# Patient Record
Sex: Female | Born: 1985 | Race: White | Hispanic: No | Marital: Single | Smoking: Never smoker
Health system: Southern US, Community
[De-identification: ages and names within clinical notes are randomized; demographics above are authoritative.]

## PROBLEM LIST (undated history)

## (undated) DIAGNOSIS — T7840XA Allergy, unspecified, initial encounter: Secondary | ICD-10-CM

## (undated) DIAGNOSIS — I10 Essential (primary) hypertension: Secondary | ICD-10-CM

## (undated) DIAGNOSIS — F419 Anxiety disorder, unspecified: Secondary | ICD-10-CM

## (undated) DIAGNOSIS — R7303 Prediabetes: Secondary | ICD-10-CM

## (undated) DIAGNOSIS — Z789 Other specified health status: Secondary | ICD-10-CM

## (undated) DIAGNOSIS — F32A Depression, unspecified: Secondary | ICD-10-CM

## (undated) HISTORY — DX: Allergy, unspecified, initial encounter: T78.40XA

## (undated) HISTORY — DX: Anxiety disorder, unspecified: F41.9

## (undated) HISTORY — DX: Depression, unspecified: F32.A

---
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
## 2006-03-23 ENCOUNTER — Emergency Department (HOSPITAL_COMMUNITY): Admission: EM | Admit: 2006-03-23 | Discharge: 2006-03-23 | Payer: Self-pay | Admitting: Family Medicine

## 2006-04-05 ENCOUNTER — Emergency Department (HOSPITAL_COMMUNITY): Admission: EM | Admit: 2006-04-05 | Discharge: 2006-04-05 | Payer: Self-pay | Admitting: Family Medicine

## 2006-05-29 ENCOUNTER — Emergency Department (HOSPITAL_COMMUNITY): Admission: EM | Admit: 2006-05-29 | Discharge: 2006-05-30 | Payer: Self-pay | Admitting: Emergency Medicine

## 2006-07-25 ENCOUNTER — Emergency Department (HOSPITAL_COMMUNITY): Admission: EM | Admit: 2006-07-25 | Discharge: 2006-07-25 | Payer: Self-pay | Admitting: Emergency Medicine

## 2007-01-26 ENCOUNTER — Emergency Department (HOSPITAL_COMMUNITY): Admission: EM | Admit: 2007-01-26 | Discharge: 2007-01-26 | Payer: Self-pay | Admitting: Family Medicine

## 2007-10-27 ENCOUNTER — Emergency Department (HOSPITAL_COMMUNITY): Admission: EM | Admit: 2007-10-27 | Discharge: 2007-10-27 | Payer: Self-pay | Admitting: Emergency Medicine

## 2008-02-27 ENCOUNTER — Emergency Department (HOSPITAL_COMMUNITY): Admission: EM | Admit: 2008-02-27 | Discharge: 2008-02-27 | Payer: Self-pay | Admitting: Family Medicine

## 2010-07-18 ENCOUNTER — Emergency Department (HOSPITAL_COMMUNITY): Payer: No Typology Code available for payment source

## 2010-07-18 ENCOUNTER — Emergency Department (HOSPITAL_COMMUNITY)
Admission: EM | Admit: 2010-07-18 | Discharge: 2010-07-19 | Disposition: A | Discharge status: Home / Self Care | Payer: No Typology Code available for payment source | Attending: Emergency Medicine | Admitting: Emergency Medicine

## 2010-07-18 DIAGNOSIS — R071 Chest pain on breathing: Secondary | ICD-10-CM | POA: Insufficient documentation

## 2010-07-18 DIAGNOSIS — R109 Unspecified abdominal pain: Secondary | ICD-10-CM | POA: Insufficient documentation

## 2010-07-18 DIAGNOSIS — Y92410 Unspecified street and highway as the place of occurrence of the external cause: Secondary | ICD-10-CM | POA: Insufficient documentation

## 2010-07-18 DIAGNOSIS — R0789 Other chest pain: Secondary | ICD-10-CM | POA: Insufficient documentation

## 2010-07-18 DIAGNOSIS — T1490XA Injury, unspecified, initial encounter: Secondary | ICD-10-CM | POA: Insufficient documentation

## 2010-12-03 LAB — POCT PREGNANCY, URINE: Preg Test, Ur: NEGATIVE

## 2012-10-31 ENCOUNTER — Emergency Department (HOSPITAL_COMMUNITY)
Admission: EM | Admit: 2012-10-31 | Discharge: 2012-10-31 | Disposition: A | Discharge status: Home / Self Care | Payer: Self-pay | Attending: Emergency Medicine | Admitting: Emergency Medicine

## 2012-10-31 ENCOUNTER — Encounter (HOSPITAL_COMMUNITY): Payer: Self-pay

## 2012-10-31 DIAGNOSIS — R21 Rash and other nonspecific skin eruption: Secondary | ICD-10-CM | POA: Insufficient documentation

## 2012-10-31 DIAGNOSIS — B86 Scabies: Secondary | ICD-10-CM | POA: Insufficient documentation

## 2012-10-31 MED ORDER — PERMETHRIN 5 % EX CREA: TOPICAL_CREAM | CUTANEOUS | Status: DC

## 2012-10-31 NOTE — ED Provider Notes (Signed)
CSN: [id number]     Arrival date & time 10/31/12  1659 History   First MD Initiated Contact with Patient 10/31/12 1730     Chief Complaint  Patient presents with  . Pruritis   (Consider location/radiation/quality/duration/timing/severity/associated sxs/prior Treatment) HPI Comments: Mandy Stewart is a 27 y.o. Female presenting with a 1 week history of pruritis and small raised scattered skin lesions suggestive of insect bites.  She recently found out that a cousin who visited from [place] of state several weeks ago had scabies while visiting.  She is desirous of treatment.  She has used benadryl which temporarily improves the itching.  She has one roommate who does not currently have symptoms.     The history is provided by the patient.    History reviewed. No pertinent past medical history. History reviewed. No pertinent past surgical history. No family history on file. History  Substance Use Topics  . Smoking status: Never Smoker   . Smokeless tobacco: Not on file  . Alcohol Use: No   OB History   Grav Para Term Preterm Abortions TAB SAB Ect Mult Living                 Review of Systems  Constitutional: Negative for fever and chills.  HENT: Negative for facial swelling and neck pain.   Eyes: Negative.   Respiratory: Negative for chest tightness, shortness of breath and wheezing.   Cardiovascular: Negative for chest pain.  Gastrointestinal: Negative for nausea and abdominal pain.  Genitourinary: Negative.   Musculoskeletal: Negative for joint swelling and arthralgias.  Skin: Positive for rash. Negative for wound.  Neurological: Negative for dizziness, weakness, light-headedness, numbness and headaches.  Psychiatric/Behavioral: Negative.     Allergies  Codeine  Home Medications   Current Outpatient Rx  Name  Route  Sig  Dispense  Refill  . montelukast (SINGULAIR) 10 MG tablet   Oral   Take 10 mg by mouth at bedtime.         . permethrin (ELIMITE) 5 % cream     Apply to affected area once   60 g   0    BP 128/82  Pulse 99  Temp(Src) 98.4 F (36.9 C) (Oral)  Resp 20  Ht 5\' 1"  (1.549 m)  Wt 152 lb (68.947 kg)  BMI 28.74 kg/m2  SpO2 100%  LMP 10/04/2012 Physical Exam  Nursing note and vitals reviewed. Constitutional: She appears well-developed and well-nourished.  HENT:  Head: Normocephalic and atraumatic.  Eyes: Conjunctivae are normal.  Neck: Normal range of motion.  Cardiovascular: Normal rate, regular rhythm, normal heart sounds and intact distal pulses.   Pulmonary/Chest: Effort normal and breath sounds normal. She has no wheezes.  Abdominal: Soft. Bowel sounds are normal. There is no tenderness.  Musculoskeletal: Normal range of motion.  Neurological: She is alert.  Skin: Skin is warm and dry. Rash noted.  Scattered small papules on arms, abdomen,  Antecubital spaces,  fingerweb spaces.  No drainage, no surrounding erythema, induration or fluctuance.    Psychiatric: She has a normal mood and affect.    ED Course  Procedures (including critical care time) Labs Review Labs Reviewed - No data to display Imaging Review No results found.  MDM   1. Scabies    Permethrin, benadryl.  Advised washing linens, etc,  Repeat tx in 10 days if sx persist.    Burgess Amor, PA-C 10/31/12 1843

## 2012-10-31 NOTE — ED Notes (Signed)
Pt reports "itching all over", was exposed to scabies recently.

## 2012-10-31 NOTE — ED Provider Notes (Signed)
Medical screening examination/treatment/procedure(s) were performed by non-physician practitioner and as supervising physician I was immediately available for consultation/collaboration.  Kaisen Ackers, MD 10/31/12 2347 

## 2013-02-11 ENCOUNTER — Emergency Department (HOSPITAL_COMMUNITY)
Admission: EM | Admit: 2013-02-11 | Discharge: 2013-02-12 | Disposition: A | Discharge status: Home / Self Care | Payer: Self-pay | Attending: Emergency Medicine | Admitting: Emergency Medicine

## 2013-02-11 ENCOUNTER — Encounter (HOSPITAL_COMMUNITY): Payer: Self-pay | Admitting: Emergency Medicine

## 2013-02-11 DIAGNOSIS — N760 Acute vaginitis: Secondary | ICD-10-CM | POA: Insufficient documentation

## 2013-02-11 DIAGNOSIS — Z3202 Encounter for pregnancy test, result negative: Secondary | ICD-10-CM | POA: Insufficient documentation

## 2013-02-11 DIAGNOSIS — A499 Bacterial infection, unspecified: Secondary | ICD-10-CM | POA: Insufficient documentation

## 2013-02-11 DIAGNOSIS — R112 Nausea with vomiting, unspecified: Secondary | ICD-10-CM | POA: Insufficient documentation

## 2013-02-11 DIAGNOSIS — R300 Dysuria: Secondary | ICD-10-CM

## 2013-02-11 DIAGNOSIS — Z79899 Other long term (current) drug therapy: Secondary | ICD-10-CM | POA: Insufficient documentation

## 2013-02-11 DIAGNOSIS — B9689 Other specified bacterial agents as the cause of diseases classified elsewhere: Secondary | ICD-10-CM | POA: Insufficient documentation

## 2013-02-11 LAB — URINALYSIS, ROUTINE W REFLEX MICROSCOPIC
Bilirubin Urine: NEGATIVE
Glucose, UA: 250 mg/dL — AB
Hgb urine dipstick: NEGATIVE
pH: 7 (ref 5.0–8.0)

## 2013-02-11 LAB — URINE MICROSCOPIC-ADD ON

## 2013-02-11 LAB — WET PREP, GENITAL: WBC, Wet Prep HPF POC: NONE SEEN

## 2013-02-11 MED ORDER — METRONIDAZOLE 500 MG PO TABS: 500.0000 mg | ORAL_TABLET | Freq: Two times a day (BID) | ORAL | Status: DC

## 2013-02-11 MED ORDER — TRAMADOL HCL 50 MG PO TABS: 50.0000 mg | ORAL_TABLET | Freq: Four times a day (QID) | ORAL | Status: DC | PRN

## 2013-02-11 NOTE — ED Provider Notes (Signed)
CSN: [id number]     Arrival date & time 02/11/13  1146 History   First MD Initiated Contact with Patient 02/11/13 1549     Chief Complaint  Patient presents with  . Dysuria  . Emesis   (Consider location/radiation/quality/duration/timing/severity/associated sxs/prior Treatment) Patient is a 27 y.o. female presenting with dysuria and vomiting. The history is provided by the patient.  Dysuria Associated symptoms: nausea and vomiting   Associated symptoms: no abdominal pain and no flank pain   Emesis Associated symptoms: no abdominal pain, no diarrhea and no headaches    patient has had some pain with urination for the last 2 days. She states her abdomen hurts when fevers. No cough. No diarrhea. No flank pain. It hurts her abdomen when she urinates, it is not painful to urinate. She has also had vomiting. No flank pain.  She states that she's had a recent miscarriage. She states that she is 10 weeks. She's not had another period since.  History reviewed. No pertinent past medical history. History reviewed. No pertinent past surgical history. History reviewed. No pertinent family history. History  Substance Use Topics  . Smoking status: Never Smoker   . Smokeless tobacco: Not on file  . Alcohol Use: No   OB History   Grav Para Term Preterm Abortions TAB SAB Ect Mult Living                 Review of Systems  Constitutional: Negative for activity change and appetite change.  Eyes: Negative for pain.  Respiratory: Negative for chest tightness and shortness of breath.   Cardiovascular: Negative for chest pain and leg swelling.  Gastrointestinal: Positive for nausea and vomiting. Negative for abdominal pain and diarrhea.  Genitourinary: Positive for dysuria. Negative for flank pain.  Musculoskeletal: Negative for back pain and neck stiffness.  Skin: Negative for rash.  Neurological: Negative for weakness, numbness and headaches.  Psychiatric/Behavioral: Negative for behavioral  problems.    Allergies  Codeine  Home Medications   Current Outpatient Rx  Name  Route  Sig  Dispense  Refill  . ibuprofen (ADVIL,MOTRIN) 200 MG tablet   Oral   Take 200 mg by mouth every 6 (six) hours as needed.         . montelukast (SINGULAIR) 10 MG tablet   Oral   Take 10 mg by mouth at bedtime as needed (for allergy relief).          . Phenazopyridine HCl (AZO TABS PO)   Oral   Take 2 tablets by mouth daily as needed (for urunary tract relief).         . Prenatal Vit-Fe Fumarate-FA (PRENATAL MULTIVITAMIN) TABS tablet   Oral   Take 1 tablet by mouth daily at 12 noon.         Marland Kitchen tetrahydrozoline (EYE DROPS) 0.05 % ophthalmic solution   Both Eyes   Place 1 drop into both eyes daily as needed.         . metroNIDAZOLE (FLAGYL) 500 MG tablet   Oral   Take 1 tablet (500 mg total) by mouth 2 (two) times daily.   14 tablet   0   . traMADol (ULTRAM) 50 MG tablet   Oral   Take 1 tablet (50 mg total) by mouth every 6 (six) hours as needed.   10 tablet   0    BP 122/91  Pulse 74  Temp(Src) 98.5 F (36.9 C) (Oral)  Resp 20  Ht 5' (1.524 m)  Wt  148 lb (67.132 kg)  BMI 28.90 kg/m2  SpO2 96% Physical Exam  Nursing note and vitals reviewed. Constitutional: She is oriented to person, place, and time. She appears well-developed and well-nourished.  HENT:  Head: Normocephalic.  Cardiovascular: Normal rate, regular rhythm and normal heart sounds.   No murmur heard. Pulmonary/Chest: Effort normal and breath sounds normal. No respiratory distress. She has no wheezes. She has no rales.  Abdominal: Soft. Bowel sounds are normal. She exhibits no distension. There is no tenderness. There is no rebound and no guarding.  Musculoskeletal: Normal range of motion.  Neurological: She is alert and oriented to person, place, and time. No cranial nerve deficit.  Skin: Skin is warm and dry.  Psychiatric: She has a normal mood and affect. Her speech is normal.   pelvic exam  showed some white foamy discharge. Mild midline tenderness. No adnexal tenderness. Uterus likely retroverted and retroflexed.  ED Course  Procedures (including critical care time) Labs Review Labs Reviewed  WET PREP, GENITAL - Abnormal; Notable for the following:    Clue Cells Wet Prep HPF POC MODERATE (*)    All other components within normal limits  URINALYSIS, ROUTINE W REFLEX MICROSCOPIC - Abnormal; Notable for the following:    Color, Urine ORANGE (*)    Glucose, UA 250 (*)    Ketones, ur TRACE (*)    Protein, ur 100 (*)    Urobilinogen, UA >8.0 (*)    Nitrite POSITIVE (*)    Leukocytes, UA TRACE (*)    All other components within normal limits  URINE MICROSCOPIC-ADD ON - Abnormal; Notable for the following:    Squamous Epithelial / LPF MANY (*)    Bacteria, UA FEW (*)    All other components within normal limits  URINE CULTURE  GC/CHLAMYDIA PROBE AMP  PREGNANCY, URINE   Imaging Review No results found.  EKG Interpretation   None       MDM   1. Bacterial vaginosis   2. Dysuria    Patient with some dysuria and pelvic pain. Reported recent miscarriage. Negative urine pregnancy test here. Does have bacterial vaginosis on wet prep. Only few bacteria in urine but does have nitrite and leukocytes leuk esterase. Will give Flagyl and urine culture has been sent. Will followup with gynecology as needed. Minimal abdominal tenderness    Juliet Rude. Rubin Payor, MD 02/11/13 [phone]

## 2013-02-11 NOTE — ED Notes (Signed)
Pt c/o pain with urination since Friday, took AZO with relief until last night, noted blood in urine now, denies fever, + emesis,right lower abd pain, pt also recent miscarriage about 3 weeks ago

## 2013-02-12 LAB — URINE CULTURE
Colony Count: NO GROWTH
Culture: NO GROWTH

## 2013-05-16 ENCOUNTER — Encounter (HOSPITAL_COMMUNITY): Payer: Self-pay | Admitting: Emergency Medicine

## 2013-05-16 ENCOUNTER — Emergency Department (HOSPITAL_COMMUNITY)
Admission: EM | Admit: 2013-05-16 | Discharge: 2013-05-17 | Disposition: A | Discharge status: Home / Self Care | Payer: Self-pay | Attending: Emergency Medicine | Admitting: Emergency Medicine

## 2013-05-16 DIAGNOSIS — Z79899 Other long term (current) drug therapy: Secondary | ICD-10-CM | POA: Insufficient documentation

## 2013-05-16 DIAGNOSIS — L03119 Cellulitis of unspecified part of limb: Principal | ICD-10-CM

## 2013-05-16 DIAGNOSIS — L02419 Cutaneous abscess of limb, unspecified: Secondary | ICD-10-CM | POA: Insufficient documentation

## 2013-05-16 DIAGNOSIS — Z792 Long term (current) use of antibiotics: Secondary | ICD-10-CM | POA: Insufficient documentation

## 2013-05-16 DIAGNOSIS — L03116 Cellulitis of left lower limb: Secondary | ICD-10-CM

## 2013-05-16 NOTE — ED Notes (Signed)
Pt reports "boil" to left inner thigh, pt states she has been having headache, chills and fever. Pt states she just notified the boil yesterday

## 2013-05-17 MED ORDER — SULFAMETHOXAZOLE-TMP DS 800-160 MG PO TABS
1.0000 | ORAL_TABLET | Freq: Once | ORAL | Status: AC
  Administered 2013-05-17: 1 via ORAL
  Filled 2013-05-17: qty 1

## 2013-05-17 MED ORDER — CEPHALEXIN 500 MG PO CAPS: 500.0000 mg | ORAL_CAPSULE | Freq: Four times a day (QID) | ORAL | Status: DC

## 2013-05-17 MED ORDER — ACETAMINOPHEN 325 MG PO TABS
650.0000 mg | ORAL_TABLET | Freq: Once | ORAL | Status: AC
  Administered 2013-05-17: 650 mg via ORAL
  Filled 2013-05-17: qty 2

## 2013-05-17 MED ORDER — SULFAMETHOXAZOLE-TRIMETHOPRIM 800-160 MG PO TABS: 1.0000 | ORAL_TABLET | Freq: Two times a day (BID) | ORAL | Status: DC

## 2013-05-17 MED ORDER — CEPHALEXIN 500 MG PO CAPS
500.0000 mg | ORAL_CAPSULE | Freq: Once | ORAL | Status: AC
  Administered 2013-05-17: 500 mg via ORAL
  Filled 2013-05-17: qty 1

## 2013-05-17 MED ORDER — IBUPROFEN 800 MG PO TABS: 800.0000 mg | ORAL_TABLET | Freq: Three times a day (TID) | ORAL | Status: DC

## 2013-05-17 NOTE — Discharge Instructions (Signed)
Cellulitis °Cellulitis is an infection of the skin and the tissue under the skin. The infected area is usually red and tender. This happens most often in the arms and lower legs. °HOME CARE  °· Take your antibiotic medicine as told. Finish the medicine even if you start to feel better. °· Keep the infected arm or leg raised (elevated). °· Put a warm cloth on the area up to 4 times per day. °· Only take medicines as told by your doctor. °· Keep all doctor visits as told. °GET HELP RIGHT AWAY IF:  °· You have a fever. °· You feel very sleepy. °· You throw up (vomit) or have watery poop (diarrhea). °· You feel sick and have muscle aches and pains. °· You see red streaks on the skin coming from the infected area. °· Your red area gets bigger or turns a dark color. °· Your bone or joint under the infected area is painful after the skin heals. °· Your infection comes back in the same area or different area. °· You have a puffy (swollen) bump in the infected area. °· You have new symptoms. °MAKE SURE YOU:  °· Understand these instructions. °· Will watch your condition. °· Will get help right away if you are not doing well or get worse. °Document Released: 08/03/2007 Document Revised: 08/16/2011 Document Reviewed: 05/02/2011 °[employer]® Patient Information ©2014 [employer]. ° °

## 2013-05-17 NOTE — ED Provider Notes (Signed)
CSN: [id number]     Arrival date & time 05/16/13  2313 History   First MD Initiated Contact with Patient 05/17/13 0011     Chief Complaint  Patient presents with  . Abscess     (Consider location/radiation/quality/duration/timing/severity/associated sxs/prior Treatment) HPI Comments: JANEYA DEYO is a 28 y.o. female who presents to the Emergency Department complaining of pain, redness and swelling of the inner left thigh.  patient states that she noticed symptoms yesterday. she also c/o fever, body aches, intermittent headaches and chills that also began yesterday.  Patient is concerned that it may be a "boil".  She denies abdominal pain, vomiting, or hx of MRSA  The history is provided by the patient.    History reviewed. No pertinent past medical history. History reviewed. No pertinent past surgical history. No family history on file. History  Substance Use Topics  . Smoking status: Never Smoker   . Smokeless tobacco: Not on file  . Alcohol Use: No   OB History   Grav Para Term Preterm Abortions TAB SAB Ect Mult Living                 Review of Systems  Constitutional: Negative for fever and chills.  Gastrointestinal: Negative for nausea and vomiting.  Musculoskeletal: Negative for arthralgias and joint swelling.  Skin: Positive for color change.       Pustule to left upper thigh with redness   Hematological: Negative for adenopathy.  All other systems reviewed and are negative.      Allergies  Codeine  Home Medications   Current Outpatient Rx  Name  Route  Sig  Dispense  Refill  . ibuprofen (ADVIL,MOTRIN) 200 MG tablet   Oral   Take 200 mg by mouth every 6 (six) hours as needed.         . metroNIDAZOLE (FLAGYL) 500 MG tablet   Oral   Take 1 tablet (500 mg total) by mouth 2 (two) times daily.   14 tablet   0   . montelukast (SINGULAIR) 10 MG tablet   Oral   Take 10 mg by mouth at bedtime as needed (for allergy relief).          . Phenazopyridine HCl  (AZO TABS PO)   Oral   Take 2 tablets by mouth daily as needed (for urunary tract relief).         . Prenatal Vit-Fe Fumarate-FA (PRENATAL MULTIVITAMIN) TABS tablet   Oral   Take 1 tablet by mouth daily at 12 noon.         Marland Kitchen tetrahydrozoline (EYE DROPS) 0.05 % ophthalmic solution   Both Eyes   Place 1 drop into both eyes daily as needed.         . traMADol (ULTRAM) 50 MG tablet   Oral   Take 1 tablet (50 mg total) by mouth every 6 (six) hours as needed.   10 tablet   0    BP 128/72  Pulse 72  Temp(Src) 99.6 F (37.6 C) (Oral)  Resp 16  Ht 5' (1.524 m)  Wt 160 lb (72.576 kg)  BMI 31.25 kg/m2  SpO2 96%  LMP 05/16/2013 Physical Exam  Nursing note and vitals reviewed. Constitutional: She is oriented to person, place, and time. She appears well-developed and well-nourished. No distress.  HENT:  Head: Normocephalic and atraumatic.  Cardiovascular: Normal rate, regular rhythm, normal heart sounds and intact distal pulses.   No murmur heard. Pulmonary/Chest: Effort normal and breath sounds normal. No  respiratory distress.  Abdominal: Soft. She exhibits no distension. There is no tenderness. There is no rebound and no guarding.  Musculoskeletal: Normal range of motion.  Lymphadenopathy:       Left: Inguinal adenopathy present.  Neurological: She is alert and oriented to person, place, and time. She exhibits normal muscle tone. Coordination normal.  Skin: Skin is warm and dry. There is erythema.     Single, 1 cm open pustule to the medial left thigh with surrounding erythema.  Slight drainage present.  No induration or fluctuance.  No lymphangitis.      ED Course  Procedures (including critical care time) Labs Review Labs Reviewed - No data to display Imaging Review No results found.   EKG Interpretation None      MDM   Final diagnoses:  Cellulitis of left thigh    Pt is well appearing, VSS.  Ambulates w/o difficulty.  Has small open pustule with  surrounding erythema .  Likely cellulitis vs early abscess.  I&D not indicated at this time.  Pt agrees to warm soaks, bactrim and keflex.  Advised to return here in 1-2 days for recheck.  Pt verbalized understanding and agrees to plan.  Leading edge of erythema was marked.    The patient appears reasonably screened and/or stabilized for discharge and I doubt any other medical condition or other [facility] requiring further screening, evaluation, or treatment in the ED at this time prior to discharge.    Hercules Hasler L. Trisha Mangleriplett, PA-C 05/17/13 1126

## 2013-05-18 NOTE — ED Provider Notes (Signed)
Medical screening examination/treatment/procedure(s) were performed by non-physician practitioner and as supervising physician I was immediately available for consultation/collaboration.   Arul Farabee, MD 05/18/13 0721 

## 2013-12-24 ENCOUNTER — Emergency Department (HOSPITAL_COMMUNITY)
Admission: EM | Admit: 2013-12-24 | Discharge: 2013-12-24 | Disposition: A | Discharge status: Home / Self Care | Payer: Self-pay | Attending: Emergency Medicine | Admitting: Emergency Medicine

## 2013-12-24 ENCOUNTER — Encounter (HOSPITAL_COMMUNITY): Payer: Self-pay | Admitting: Emergency Medicine

## 2013-12-24 DIAGNOSIS — Z792 Long term (current) use of antibiotics: Secondary | ICD-10-CM | POA: Insufficient documentation

## 2013-12-24 DIAGNOSIS — K529 Noninfective gastroenteritis and colitis, unspecified: Secondary | ICD-10-CM | POA: Insufficient documentation

## 2013-12-24 DIAGNOSIS — Z79899 Other long term (current) drug therapy: Secondary | ICD-10-CM | POA: Insufficient documentation

## 2013-12-24 DIAGNOSIS — R112 Nausea with vomiting, unspecified: Secondary | ICD-10-CM | POA: Insufficient documentation

## 2013-12-24 LAB — COMPREHENSIVE METABOLIC PANEL
ALT: 11 U/L (ref 0–35)
ANION GAP: 13 (ref 5–15)
AST: 16 U/L (ref 0–37)
Albumin: 4.1 g/dL (ref 3.5–5.2)
Alkaline Phosphatase: 64 U/L (ref 39–117)
BUN: 18 mg/dL (ref 6–23)
CO2: 26 meq/L (ref 19–32)
CREATININE: 0.68 mg/dL (ref 0.50–1.10)
Calcium: 9.4 mg/dL (ref 8.4–10.5)
Chloride: 102 mEq/L (ref 96–112)
Glucose, Bld: 126 mg/dL — ABNORMAL HIGH (ref 70–99)
Potassium: 4 mEq/L (ref 3.7–5.3)
Sodium: 141 mEq/L (ref 137–147)
Total Bilirubin: 0.3 mg/dL (ref 0.3–1.2)
Total Protein: 8 g/dL (ref 6.0–8.3)

## 2013-12-24 LAB — CBC WITH DIFFERENTIAL/PLATELET
Basophils Absolute: 0 10*3/uL (ref 0.0–0.1)
Basophils Relative: 0 % (ref 0–1)
EOS ABS: 0 10*3/uL (ref 0.0–0.7)
Eosinophils Relative: 0 % (ref 0–5)
HCT: 39.2 % (ref 36.0–46.0)
HEMOGLOBIN: 13.3 g/dL (ref 12.0–15.0)
LYMPHS ABS: 1.5 10*3/uL (ref 0.7–4.0)
LYMPHS PCT: 13 % (ref 12–46)
MCH: 29.8 pg (ref 26.0–34.0)
MCHC: 33.9 g/dL (ref 30.0–36.0)
MCV: 87.7 fL (ref 78.0–100.0)
MONOS PCT: 4 % (ref 3–12)
Monocytes Absolute: 0.5 10*3/uL (ref 0.1–1.0)
NEUTROS PCT: 83 % — AB (ref 43–77)
Neutro Abs: 9 10*3/uL — ABNORMAL HIGH (ref 1.7–7.7)
PLATELETS: 230 10*3/uL (ref 150–400)
RBC: 4.47 MIL/uL (ref 3.87–5.11)
RDW: 12.6 % (ref 11.5–15.5)
WBC: 11 10*3/uL — AB (ref 4.0–10.5)

## 2013-12-24 LAB — URINE MICROSCOPIC-ADD ON

## 2013-12-24 LAB — URINALYSIS, ROUTINE W REFLEX MICROSCOPIC
BILIRUBIN URINE: NEGATIVE
Glucose, UA: NEGATIVE mg/dL
Ketones, ur: NEGATIVE mg/dL
NITRITE: NEGATIVE
PH: 5.5 (ref 5.0–8.0)
Protein, ur: NEGATIVE mg/dL
UROBILINOGEN UA: 0.2 mg/dL (ref 0.0–1.0)

## 2013-12-24 LAB — PREGNANCY, URINE: PREG TEST UR: NEGATIVE

## 2013-12-24 MED ORDER — IBUPROFEN 800 MG PO TABS: 800.0000 mg | ORAL_TABLET | Freq: Three times a day (TID) | ORAL | Status: DC | PRN

## 2013-12-24 MED ORDER — SODIUM CHLORIDE 0.9 % IV BOLUS (SEPSIS)
1000.0000 mL | Freq: Once | INTRAVENOUS | Status: AC
  Administered 2013-12-24: 1000 mL via INTRAVENOUS

## 2013-12-24 MED ORDER — ONDANSETRON HCL 4 MG/2ML IJ SOLN
4.0000 mg | Freq: Once | INTRAMUSCULAR | Status: AC
  Administered 2013-12-24: 4 mg via INTRAVENOUS
  Filled 2013-12-24: qty 2

## 2013-12-24 MED ORDER — LORAZEPAM 2 MG/ML IJ SOLN
0.5000 mg | Freq: Once | INTRAMUSCULAR | Status: AC
  Administered 2013-12-24: 0.5 mg via INTRAVENOUS
  Filled 2013-12-24: qty 1

## 2013-12-24 MED ORDER — PROMETHAZINE HCL 25 MG PO TABS: 25.0000 mg | ORAL_TABLET | Freq: Four times a day (QID) | ORAL | Status: DC | PRN

## 2013-12-24 MED ORDER — KETOROLAC TROMETHAMINE 30 MG/ML IJ SOLN
30.0000 mg | Freq: Once | INTRAMUSCULAR | Status: AC
  Administered 2013-12-24: 30 mg via INTRAVENOUS
  Filled 2013-12-24: qty 1

## 2013-12-24 NOTE — ED Provider Notes (Addendum)
CSN: [id number]     Arrival date & time 12/24/13  0702 History  This chart was scribed for Mandy LennertJoseph L Lakya Schrupp, MD by Annye AsaAnna Dorsett, ED Scribe. This patient was seen in room APA19/[facility]19 and the patient's care was started at 7:20 AM.    Chief Complaint  Patient presents with  . Abdominal Pain   Patient is a 28 y.o. female presenting with abdominal pain. The history is provided by the patient (pt complains of abd pain). No language interpreter was used.  Abdominal Pain Pain location:  Generalized Pain quality: aching   Pain radiates to:  Does not radiate Pain severity:  Moderate Onset quality:  Sudden Associated symptoms: diarrhea, nausea and vomiting   Associated symptoms: no chest pain, no cough, no fatigue and no hematuria     HPI Comments: Mandy Stewart is a 28 y.o. female who presents to the Emergency Department complaining of 5 hours of gradual onset, worsening upper abdominal pain. She complains of nausea (intermittent, watery) and vomiting (2x). She describes her pain as a "gassy" feeling. Patient denies sick contacts.  History reviewed. No pertinent past medical history. History reviewed. No pertinent past surgical history. History reviewed. No pertinent family history. History  Substance Use Topics  . Smoking status: Never Smoker   . Smokeless tobacco: Not on file  . Alcohol Use: No   OB History   Grav Para Term Preterm Abortions TAB SAB Ect Mult Living   2    2  2    0     Review of Systems  Constitutional: Negative for appetite change and fatigue.  HENT: Negative for congestion, ear discharge and sinus pressure.   Eyes: Negative for discharge.  Respiratory: Negative for cough.   Cardiovascular: Negative for chest pain.  Gastrointestinal: Positive for nausea, vomiting, abdominal pain and diarrhea.  Genitourinary: Negative for frequency and hematuria.  Musculoskeletal: Negative for back pain.  Skin: Negative for rash.  Neurological: Negative for seizures and headaches.   Psychiatric/Behavioral: Negative for hallucinations.      Allergies  Codeine  Home Medications   Prior to Admission medications   Medication Sig Start Date End Date Taking? Authorizing Provider  cephALEXin (KEFLEX) 500 MG capsule Take 1 capsule (500 mg total) by mouth 4 (four) times daily. For 10 days 05/17/13   Tammy L. Triplett, PA-C  ibuprofen (ADVIL,MOTRIN) 200 MG tablet Take 200 mg by mouth every 6 (six) hours as needed.    Historical Provider, MD  ibuprofen (ADVIL,MOTRIN) 800 MG tablet Take 1 tablet (800 mg total) by mouth 3 (three) times daily. 05/17/13   Tammy L. Triplett, PA-C  metroNIDAZOLE (FLAGYL) 500 MG tablet Take 1 tablet (500 mg total) by mouth 2 (two) times daily. 02/11/13   Juliet RudeNathan R. Pickering, MD  montelukast (SINGULAIR) 10 MG tablet Take 10 mg by mouth at bedtime as needed (for allergy relief).     Historical Provider, MD  Phenazopyridine HCl (AZO TABS PO) Take 2 tablets by mouth daily as needed (for urunary tract relief).    Historical Provider, MD  Prenatal Vit-Fe Fumarate-FA (PRENATAL MULTIVITAMIN) TABS tablet Take 1 tablet by mouth daily at 12 noon.    Historical Provider, MD  sulfamethoxazole-trimethoprim (SEPTRA DS) 800-160 MG per tablet Take 1 tablet by mouth 2 (two) times daily. For 10 days 05/17/13   Tammy L. Triplett, PA-C  tetrahydrozoline (EYE DROPS) 0.05 % ophthalmic solution Place 1 drop into both eyes daily as needed.    Historical Provider, MD  traMADol (ULTRAM) 50 MG tablet  Take 1 tablet (50 mg total) by mouth every 6 (six) hours as needed. 02/11/13   Juliet RudeNathan R. Pickering, MD   BP 134/105  Pulse 69  Temp(Src) 97.7 F (36.5 C) (Oral)  Ht 5' (1.524 m)  Wt 160 lb (72.576 kg)  BMI 31.25 kg/m2  SpO2 99% Physical Exam  Constitutional: She is oriented to person, place, and time. She appears well-developed.  HENT:  Head: Normocephalic.  Eyes: Conjunctivae and EOM are normal. No scleral icterus.  Neck: Neck supple. No thyromegaly present.   Cardiovascular: Normal rate and regular rhythm.  Exam reveals no gallop and no friction rub.   No murmur heard. Pulmonary/Chest: No stridor. She has no wheezes. She has no rales. She exhibits no tenderness.  Abdominal: Soft. She exhibits no distension. There is no rebound.  Mild tenderness throughout abdomen   Musculoskeletal: Normal range of motion. She exhibits no edema.  Lymphadenopathy:    She has no cervical adenopathy.  Neurological: She is oriented to person, place, and time. She exhibits normal muscle tone. Coordination normal.  Skin: No rash noted. No erythema.  Psychiatric: She has a normal mood and affect. Her behavior is normal.    ED Course  Procedures   DIAGNOSTIC STUDIES: Oxygen Saturation is 99% on RA, normal by my interpretation.    COORDINATION OF CARE: 7:25 AM Discussed treatment plan with pt at bedside and pt agreed to plan.   Labs Review Labs Reviewed  URINALYSIS, ROUTINE W REFLEX MICROSCOPIC    Imaging Review No results found.   EKG Interpretation None      MDM   Final diagnoses:  None    The chart was scribed for me under my direct supervision.  I personally performed the history, physical, and medical decision making and all procedures in the evaluation of this patient.Mandy Stewart.     Omolola Mittman L Malisha Mabey, MD 12/24/13 [id number]  Mandy LennertJoseph L Vyolet Sakuma, MD 12/24/13 [phone]

## 2013-12-24 NOTE — Discharge Instructions (Signed)
Drink plenty of fluids.   Take imodium for diarhea.  Follow up in 2-3 days if not improving

## 2013-12-24 NOTE — ED Notes (Signed)
Patient c/o upper abd pain that radiates into back that started this morning at 2:30. Patient reports nausea, vomiting, and diarrhea. Per patient took 2 tylenol at 3am with no relief. Patient extremely restless.

## 2013-12-30 ENCOUNTER — Encounter (HOSPITAL_COMMUNITY): Payer: Self-pay | Admitting: Emergency Medicine

## 2014-05-29 ENCOUNTER — Other Ambulatory Visit (HOSPITAL_COMMUNITY): Payer: Self-pay | Admitting: Family Medicine

## 2014-05-29 DIAGNOSIS — R930 Abnormal findings on diagnostic imaging of skull and head, not elsewhere classified: Secondary | ICD-10-CM

## 2014-06-05 ENCOUNTER — Ambulatory Visit (HOSPITAL_COMMUNITY): Payer: Self-pay

## 2014-06-11 ENCOUNTER — Ambulatory Visit (HOSPITAL_COMMUNITY)
Admission: RE | Admit: 2014-06-11 | Discharge: 2014-06-11 | Disposition: A | Discharge status: Home / Self Care | Payer: PRIVATE HEALTH INSURANCE | Source: Ambulatory Visit | Attending: Family Medicine | Admitting: Family Medicine

## 2014-06-11 DIAGNOSIS — G43909 Migraine, unspecified, not intractable, without status migrainosus: Secondary | ICD-10-CM | POA: Insufficient documentation

## 2014-06-11 DIAGNOSIS — R930 Abnormal findings on diagnostic imaging of skull and head, not elsewhere classified: Secondary | ICD-10-CM

## 2014-06-11 LAB — HM PAP SMEAR

## 2014-06-11 MED ORDER — GADOBENATE DIMEGLUMINE 529 MG/ML IV SOLN
15.0000 mL | Freq: Once | INTRAVENOUS | Status: AC | PRN
  Administered 2014-06-11: 15 mL via INTRAVENOUS

## 2014-07-15 ENCOUNTER — Encounter: Payer: Self-pay | Admitting: *Deleted

## 2014-07-18 ENCOUNTER — Encounter: Payer: Self-pay | Admitting: Obstetrics & Gynecology

## 2014-11-07 ENCOUNTER — Emergency Department (HOSPITAL_COMMUNITY)
Admission: EM | Admit: 2014-11-07 | Discharge: 2014-11-07 | Disposition: A | Discharge status: Home / Self Care | Payer: PRIVATE HEALTH INSURANCE | Attending: Emergency Medicine | Admitting: Emergency Medicine

## 2014-11-07 ENCOUNTER — Encounter (HOSPITAL_COMMUNITY): Payer: Self-pay | Admitting: Emergency Medicine

## 2014-11-07 DIAGNOSIS — Z3202 Encounter for pregnancy test, result negative: Secondary | ICD-10-CM | POA: Insufficient documentation

## 2014-11-07 DIAGNOSIS — R109 Unspecified abdominal pain: Secondary | ICD-10-CM

## 2014-11-07 DIAGNOSIS — Z79899 Other long term (current) drug therapy: Secondary | ICD-10-CM | POA: Insufficient documentation

## 2014-11-07 DIAGNOSIS — R1033 Periumbilical pain: Secondary | ICD-10-CM | POA: Insufficient documentation

## 2014-11-07 LAB — CBC WITH DIFFERENTIAL/PLATELET
BASOS ABS: 0 10*3/uL (ref 0.0–0.1)
Basophils Relative: 0 % (ref 0–1)
Eosinophils Absolute: 0.1 10*3/uL (ref 0.0–0.7)
Eosinophils Relative: 1 % (ref 0–5)
HCT: 39.2 % (ref 36.0–46.0)
HEMOGLOBIN: 13.4 g/dL (ref 12.0–15.0)
LYMPHS PCT: 29 % (ref 12–46)
Lymphs Abs: 1.5 10*3/uL (ref 0.7–4.0)
MCH: 30 pg (ref 26.0–34.0)
MCHC: 34.2 g/dL (ref 30.0–36.0)
MCV: 87.9 fL (ref 78.0–100.0)
MONO ABS: 0.5 10*3/uL (ref 0.1–1.0)
Monocytes Relative: 10 % (ref 3–12)
NEUTROS ABS: 3.2 10*3/uL (ref 1.7–7.7)
Neutrophils Relative %: 60 % (ref 43–77)
Platelets: 212 10*3/uL (ref 150–400)
RBC: 4.46 MIL/uL (ref 3.87–5.11)
RDW: 12.3 % (ref 11.5–15.5)
WBC: 5.3 10*3/uL (ref 4.0–10.5)

## 2014-11-07 LAB — COMPREHENSIVE METABOLIC PANEL
ALT: 22 U/L (ref 14–54)
AST: 21 U/L (ref 15–41)
Albumin: 3.8 g/dL (ref 3.5–5.0)
Alkaline Phosphatase: 47 U/L (ref 38–126)
Anion gap: 7 (ref 5–15)
BILIRUBIN TOTAL: 0.4 mg/dL (ref 0.3–1.2)
BUN: 10 mg/dL (ref 6–20)
CO2: 25 mmol/L (ref 22–32)
CREATININE: 0.55 mg/dL (ref 0.44–1.00)
Calcium: 8.8 mg/dL — ABNORMAL LOW (ref 8.9–10.3)
Chloride: 107 mmol/L (ref 101–111)
GFR calc Af Amer: >60 mL/min (ref >60)
GFR calc non Af Amer: >60 mL/min (ref >60)
Glucose, Bld: 88 mg/dL (ref 65–99)
Potassium: 3.8 mmol/L (ref 3.5–5.1)
Sodium: 139 mmol/L (ref 135–145)
TOTAL PROTEIN: 7.5 g/dL (ref 6.5–8.1)

## 2014-11-07 LAB — URINALYSIS, ROUTINE W REFLEX MICROSCOPIC
BILIRUBIN URINE: NEGATIVE
GLUCOSE, UA: NEGATIVE mg/dL
Hgb urine dipstick: NEGATIVE
Ketones, ur: NEGATIVE mg/dL
Leukocytes, UA: NEGATIVE
Nitrite: NEGATIVE
PH: 7.5 (ref 5.0–8.0)
Protein, ur: NEGATIVE mg/dL
Specific Gravity, Urine: 1.02 (ref 1.005–1.030)
Urobilinogen, UA: 0.2 mg/dL (ref 0.0–1.0)

## 2014-11-07 LAB — PREGNANCY, URINE: Preg Test, Ur: NEGATIVE

## 2014-11-07 LAB — LIPASE, BLOOD: Lipase: 20 U/L — ABNORMAL LOW (ref 22–51)

## 2014-11-07 MED ORDER — RANITIDINE HCL 150 MG PO CAPS: 150.0000 mg | ORAL_CAPSULE | Freq: Two times a day (BID) | ORAL | Status: DC

## 2014-11-07 MED ORDER — HYDROMORPHONE HCL 1 MG/ML IJ SOLN
1.0000 mg | Freq: Once | INTRAMUSCULAR | Status: AC
  Administered 2014-11-07: 1 mg via INTRAVENOUS
  Filled 2014-11-07: qty 1

## 2014-11-07 MED ORDER — PANTOPRAZOLE SODIUM 40 MG IV SOLR
40.0000 mg | Freq: Once | INTRAVENOUS | Status: AC
  Administered 2014-11-07: 40 mg via INTRAVENOUS
  Filled 2014-11-07: qty 40

## 2014-11-07 MED ORDER — PROMETHAZINE HCL 25 MG PO TABS: 25.0000 mg | ORAL_TABLET | Freq: Four times a day (QID) | ORAL | Status: DC | PRN

## 2014-11-07 MED ORDER — ONDANSETRON HCL 4 MG/2ML IJ SOLN
4.0000 mg | Freq: Once | INTRAMUSCULAR | Status: AC
  Administered 2014-11-07: 4 mg via INTRAVENOUS
  Filled 2014-11-07: qty 2

## 2014-11-07 NOTE — ED Notes (Signed)
Pt given d/c instructions & prescriptions. Voiced appropriate understanding and departed under her own power, in NAD.

## 2014-11-07 NOTE — ED Notes (Signed)
Been having on & off abd pain for past 4 wks. Pain at umbilicus, radiating to RLQ & R flank. 4 loose stools in past 24hrs.

## 2014-11-07 NOTE — ED Provider Notes (Signed)
CSN: [id number]     Arrival date & time 11/07/14  1205 History  This chart was scribed for Mandy Berkshire, MD by Tanda Rockers, ED Scribe. This patient was seen in room APA12/APA12 and the patient's care was started at 12:43 PM.  Chief Complaint  Patient presents with  . Abdominal Pain   Patient is a 29 y.o. female presenting with abdominal pain. The history is provided by the patient. No language interpreter was used.  Abdominal Pain Pain location:  Periumbilical Pain radiates to:  LLQ, RLQ, L flank and R flank Pain severity:  Moderate Duration:  4 weeks Timing:  Intermittent Chronicity:  Recurrent Associated symptoms: no chest pain, no cough, no diarrhea, no fatigue, no hematuria, no nausea and no vomiting      HPI Comments: MAKALYNN BERWANGER is a 29 y.o. female who presents to the Emergency Department complaining of gradual onset, intermittent, periumbilical pain x 1 month. The pain radiates to her lower abdomen and around to her bilateral flanks. She states that she first thought the pain was related to eating spicy foods. Pt has since stopped eating spicy foods but states that pain persists. Pt mentions similar symptoms 1 year ago when she was diagnosed with gas. She denies nausea, vomiting, or any other associated symptoms. No fhx gallbladder issues.    History reviewed. No pertinent past medical history. History reviewed. No pertinent past surgical history. History reviewed. No pertinent family history. Social History  Substance Use Topics  . Smoking status: Never Smoker   . Smokeless tobacco: None  . Alcohol Use: 0.6 oz/week    1 Cans of beer per week   OB History    Gravida Para Term Preterm AB TAB SAB Ectopic Multiple Living   0     Review of Systems  Constitutional: Negative for appetite change and fatigue.  HENT: Negative for congestion, ear discharge and sinus pressure.   Eyes: Negative for discharge.  Respiratory: Negative for cough.   Cardiovascular:  Negative for chest pain.  Gastrointestinal: Positive for abdominal pain. Negative for nausea, vomiting and diarrhea.  Genitourinary: Negative for frequency and hematuria.  Musculoskeletal: Negative for back pain.  Skin: Negative for rash.  Neurological: Negative for seizures and headaches.  Psychiatric/Behavioral: Negative for hallucinations.   Allergies  Codeine  Home Medications   Prior to Admission medications   Medication Sig Start Date End Date Taking? Authorizing Provider  acetaminophen (TYLENOL) 500 MG tablet Take 1,000 mg by mouth every 6 (six) hours as needed for moderate pain.    Historical Provider, MD  ibuprofen (ADVIL,MOTRIN) 200 MG tablet Take 200 mg by mouth every 6 (six) hours as needed for moderate pain.     Historical Provider, MD  ibuprofen (ADVIL,MOTRIN) 800 MG tablet Take 1 tablet (800 mg total) by mouth every 8 (eight) hours as needed for moderate pain. 12/24/13   Mandy Berkshire, MD  montelukast (SINGULAIR) 10 MG tablet Take 10 mg by mouth at bedtime as needed (for allergy relief).     Historical Provider, MD  phentermine 37.5 MG capsule Take 37.5 mg by mouth every morning.    Historical Provider, MD  Prenatal Vit-Fe Fumarate-FA (PRENATAL MULTIVITAMIN) TABS tablet Take 1 tablet by mouth daily at 12 noon.    Historical Provider, MD  promethazine (PHENERGAN) 25 MG tablet Take 1 tablet (25 mg total) by mouth every 6 (six) hours as needed for nausea or vomiting. 12/24/13   Mandy Berkshire, MD  rizatriptan (MAXALT) 10 MG tablet Take 10 mg by mouth as needed for migraine. May repeat in 2 hours if needed    Historical Provider, MD   Triage Vitals: BP 151/95 mmHg  Pulse 68  Temp(Src) 97.7 F (36.5 C) (Oral)  Resp 18  Ht 5' 1.5" (1.562 m)  Wt 160 lb (72.576 kg)  BMI 29.75 kg/m2  SpO2 100%  LMP 10/06/2014   Physical Exam  Constitutional: She is oriented to person, place, and time. She appears well-developed.  HENT:  Head: Normocephalic.  Eyes: Conjunctivae and EOM are  normal. No scleral icterus.  Neck: Neck supple. No thyromegaly present.  Cardiovascular: Normal rate and regular rhythm.  Exam reveals no gallop and no friction rub.   No murmur heard. Pulmonary/Chest: No stridor. She has no wheezes. She has no rales. She exhibits no tenderness.  Abdominal: She exhibits no distension. There is tenderness. There is no rebound.  Mild periumbilical tenderness  Musculoskeletal: Normal range of motion. She exhibits no edema.  Lymphadenopathy:    She has no cervical adenopathy.  Neurological: She is oriented to person, place, and time. She exhibits normal muscle tone. Coordination normal.  Skin: No rash noted. No erythema.  Psychiatric: She has a normal mood and affect. Her behavior is normal.    ED Course  Procedures (including critical care time)  DIAGNOSTIC STUDIES: Oxygen Saturation is 100% on RA, normal by my interpretation.    COORDINATION OF CARE: 12:48 PM-Discussed treatment plan which includes CMP, Lipase, UA, Urine pregnancy, CBC with pt at bedside and pt agreed to plan.   Labs Review Labs Reviewed - No data to display  Imaging Review No results found. I have personally reviewed and evaluated these images and lab results as part of my medical decision-making.   EKG Interpretation None      MDM   Final diagnoses:  None   Labs unremarkable. Patient improved with Protonix. Abdominal pain could be gastritis. The patient will be sent home with prescription for Zantac and Phenergan. She is to follow-up with her primary care doctor or GI doctor.  The chart was scribed for me under my direct supervision.  I personally performed the history, physical, and medical decision making and all procedures in the evaluation of this patient.Mandy Berkshire, MD 11/07/14 [phone]

## 2014-11-07 NOTE — Discharge Instructions (Signed)
Follow  Up with a family md or dr. Kendell Bane if not improving.

## 2014-12-21 ENCOUNTER — Observation Stay (HOSPITAL_COMMUNITY)
Admission: EM | Admit: 2014-12-21 | Discharge: 2014-12-22 | Disposition: A | Discharge status: Home / Self Care | Payer: Self-pay | Attending: General Surgery | Admitting: General Surgery

## 2014-12-21 ENCOUNTER — Encounter (HOSPITAL_COMMUNITY): Payer: Self-pay | Admitting: Emergency Medicine

## 2014-12-21 ENCOUNTER — Emergency Department (HOSPITAL_COMMUNITY): Payer: Self-pay

## 2014-12-21 DIAGNOSIS — K810 Acute cholecystitis: Secondary | ICD-10-CM | POA: Diagnosis present

## 2014-12-21 DIAGNOSIS — K8000 Calculus of gallbladder with acute cholecystitis without obstruction: Principal | ICD-10-CM | POA: Insufficient documentation

## 2014-12-21 DIAGNOSIS — K8020 Calculus of gallbladder without cholecystitis without obstruction: Secondary | ICD-10-CM

## 2014-12-21 DIAGNOSIS — Z7982 Long term (current) use of aspirin: Secondary | ICD-10-CM | POA: Insufficient documentation

## 2014-12-21 HISTORY — DX: Other specified health status: Z78.9

## 2014-12-21 LAB — DIFFERENTIAL
BASOS ABS: 0 10*3/uL (ref 0.0–0.1)
BASOS PCT: 0 %
Eosinophils Absolute: 0 10*3/uL (ref 0.0–0.7)
Eosinophils Relative: 1 %
LYMPHS PCT: 17 %
Lymphs Abs: 1.3 10*3/uL (ref 0.7–4.0)
Monocytes Absolute: 0.4 10*3/uL (ref 0.1–1.0)
Monocytes Relative: 5 %
NEUTROS ABS: 6.1 10*3/uL (ref 1.7–7.7)
NEUTROS PCT: 77 %

## 2014-12-21 LAB — CBC
HCT: 39 % (ref 36.0–46.0)
HEMOGLOBIN: 13.4 g/dL (ref 12.0–15.0)
MCH: 30.4 pg (ref 26.0–34.0)
MCHC: 34.4 g/dL (ref 30.0–36.0)
MCV: 88.4 fL (ref 78.0–100.0)
Platelets: 195 10*3/uL (ref 150–400)
RBC: 4.41 MIL/uL (ref 3.87–5.11)
RDW: 12.9 % (ref 11.5–15.5)
WBC: 8 10*3/uL (ref 4.0–10.5)

## 2014-12-21 LAB — COMPREHENSIVE METABOLIC PANEL
ALT: 34 U/L (ref 14–54)
AST: 32 U/L (ref 15–41)
Albumin: 4 g/dL (ref 3.5–5.0)
Alkaline Phosphatase: 49 U/L (ref 38–126)
Anion gap: 8 (ref 5–15)
BUN: 10 mg/dL (ref 6–20)
CO2: 25 mmol/L (ref 22–32)
Calcium: 9 mg/dL (ref 8.9–10.3)
Chloride: 104 mmol/L (ref 101–111)
Creatinine, Ser: 0.6 mg/dL (ref 0.44–1.00)
Glucose, Bld: 127 mg/dL — ABNORMAL HIGH (ref 65–99)
POTASSIUM: 3.7 mmol/L (ref 3.5–5.1)
Sodium: 137 mmol/L (ref 135–145)
Total Bilirubin: 0.6 mg/dL (ref 0.3–1.2)
Total Protein: 7.7 g/dL (ref 6.5–8.1)

## 2014-12-21 LAB — URINALYSIS, ROUTINE W REFLEX MICROSCOPIC
BILIRUBIN URINE: NEGATIVE
Glucose, UA: NEGATIVE mg/dL
Ketones, ur: NEGATIVE mg/dL
NITRITE: NEGATIVE
PROTEIN: NEGATIVE mg/dL
Specific Gravity, Urine: >1.03 — ABNORMAL HIGH (ref 1.005–1.030)
UROBILINOGEN UA: 0.2 mg/dL (ref 0.0–1.0)
pH: 5.5 (ref 5.0–8.0)

## 2014-12-21 LAB — POC URINE PREG, ED: Preg Test, Ur: NEGATIVE

## 2014-12-21 LAB — URINE MICROSCOPIC-ADD ON

## 2014-12-21 LAB — LIPASE, BLOOD: LIPASE: 23 U/L (ref 11–51)

## 2014-12-21 MED ORDER — CIPROFLOXACIN IN D5W 400 MG/200ML IV SOLN
400.0000 mg | Freq: Once | INTRAVENOUS | Status: AC
Start: 2014-12-21 — End: 2014-12-21
  Administered 2014-12-21: 400 mg via INTRAVENOUS
  Filled 2014-12-21: qty 200

## 2014-12-21 MED ORDER — MORPHINE SULFATE (PF) 2 MG/ML IV SOLN
1.0000 mg | INTRAVENOUS | Status: DC | PRN
  Administered 2014-12-21 – 2014-12-22 (×3): 1 mg via INTRAVENOUS
  Filled 2014-12-21 (×4): qty 1

## 2014-12-21 MED ORDER — NICOTINE 14 MG/24HR TD PT24: 14.0000 mg | MEDICATED_PATCH | Freq: Once | TRANSDERMAL | Status: DC

## 2014-12-21 MED ORDER — ENOXAPARIN SODIUM 40 MG/0.4ML ~~LOC~~ SOLN
40.0000 mg | SUBCUTANEOUS | Status: DC
  Administered 2014-12-21: 40 mg via SUBCUTANEOUS
  Filled 2014-12-21: qty 0.4

## 2014-12-21 MED ORDER — ACETAMINOPHEN 325 MG PO TABS: 650.0000 mg | ORAL_TABLET | Freq: Four times a day (QID) | ORAL | Status: DC | PRN

## 2014-12-21 MED ORDER — METRONIDAZOLE IN NACL 5-0.79 MG/ML-% IV SOLN
500.0000 mg | Freq: Three times a day (TID) | INTRAVENOUS | Status: DC
  Administered 2014-12-21 – 2014-12-22 (×2): 500 mg via INTRAVENOUS
  Filled 2014-12-21 (×2): qty 100

## 2014-12-21 MED ORDER — ONDANSETRON HCL 4 MG/2ML IJ SOLN
4.0000 mg | Freq: Once | INTRAMUSCULAR | Status: AC
  Administered 2014-12-21: 4 mg via INTRAVENOUS
  Filled 2014-12-21: qty 2

## 2014-12-21 MED ORDER — SODIUM CHLORIDE 0.9 % IV SOLN
INTRAVENOUS | Status: DC
  Administered 2014-12-21: 16:00:00 via INTRAVENOUS

## 2014-12-21 MED ORDER — CIPROFLOXACIN IN D5W 400 MG/200ML IV SOLN
400.0000 mg | Freq: Two times a day (BID) | INTRAVENOUS | Status: DC
  Administered 2014-12-21 – 2014-12-22 (×2): 400 mg via INTRAVENOUS
  Filled 2014-12-21 (×2): qty 200

## 2014-12-21 MED ORDER — CHLORHEXIDINE GLUCONATE 4 % EX LIQD
1.0000 "application " | Freq: Once | CUTANEOUS | Status: AC
  Administered 2014-12-21: 1 via TOPICAL
  Filled 2014-12-21: qty 15

## 2014-12-21 MED ORDER — SODIUM CHLORIDE 0.9 % IV BOLUS (SEPSIS)
1000.0000 mL | Freq: Once | INTRAVENOUS | Status: AC
  Administered 2014-12-21: 1000 mL via INTRAVENOUS

## 2014-12-21 MED ORDER — ONDANSETRON HCL 4 MG PO TABS
4.0000 mg | ORAL_TABLET | Freq: Four times a day (QID) | ORAL | Status: DC | PRN
  Administered 2014-12-22: 4 mg via ORAL
  Filled 2014-12-21: qty 1

## 2014-12-21 MED ORDER — MORPHINE SULFATE (PF) 4 MG/ML IV SOLN
4.0000 mg | Freq: Once | INTRAVENOUS | Status: AC
  Administered 2014-12-21: 4 mg via INTRAVENOUS
  Filled 2014-12-21: qty 1

## 2014-12-21 MED ORDER — METRONIDAZOLE IN NACL 5-0.79 MG/ML-% IV SOLN
500.0000 mg | Freq: Once | INTRAVENOUS | Status: AC
  Administered 2014-12-21: 500 mg via INTRAVENOUS
  Filled 2014-12-21: qty 100

## 2014-12-21 MED ORDER — ONDANSETRON HCL 4 MG/2ML IJ SOLN
4.0000 mg | Freq: Four times a day (QID) | INTRAMUSCULAR | Status: DC | PRN
  Administered 2014-12-21 – 2014-12-22 (×3): 4 mg via INTRAVENOUS
  Filled 2014-12-21 (×3): qty 2

## 2014-12-21 MED ORDER — SENNOSIDES-DOCUSATE SODIUM 8.6-50 MG PO TABS: 1.0000 | ORAL_TABLET | Freq: Every evening | ORAL | Status: DC | PRN

## 2014-12-21 MED ORDER — ACETAMINOPHEN 650 MG RE SUPP: 650.0000 mg | Freq: Four times a day (QID) | RECTAL | Status: DC | PRN

## 2014-12-21 NOTE — ED Notes (Signed)
Patient complaining of upper abdominal pain, vomiting, and diarrhea starting at 0300 today.

## 2014-12-21 NOTE — ED Provider Notes (Signed)
CSN: [id number]     Arrival date & time 12/21/14  4098 History   First MD Initiated Contact with Patient 12/21/14 [phone]     Chief Complaint  Patient presents with  . Abdominal Pain     (Consider location/radiation/quality/duration/timing/severity/associated sxs/prior Treatment) HPI   Mandy Stewart is a 29 y.o. female who presents to the Emergency Department complaining of sudden onset of upper abdominal pain at 3:00 am this morning.  She notes persistent, severe pain that developed along with watery stool and several episodes of vomiting.  Pain has been radiating into her back.  She states this has been a recurrent problem and was told in the past this was related to gastritis and "gas".  She has been taking Nexium with some relief.  She denies sick contacts, fever, chest pain, shortness of breath.  Denies any previous abdominal surgeries.      History reviewed. No pertinent past medical history. History reviewed. No pertinent past surgical history. History reviewed. No pertinent family history. Social History  Substance Use Topics  . Smoking status: Never Smoker   . Smokeless tobacco: None  . Alcohol Use: 0.6 oz/week    1 Cans of beer per week     Comment: occasionally   OB History    Gravida Para Term Preterm AB TAB SAB Ectopic Multiple Living   0     Review of Systems  Constitutional: Negative for fever, chills and appetite change.  Respiratory: Negative for shortness of breath.   Cardiovascular: Negative for chest pain.  Gastrointestinal: Positive for nausea, vomiting and abdominal pain. Negative for blood in stool and abdominal distention.  Genitourinary: Negative for dysuria, flank pain, decreased urine volume and difficulty urinating.  Musculoskeletal: Negative for back pain.  Skin: Negative for color change and rash.  Neurological: Negative for dizziness, weakness and numbness.  Hematological: Negative for adenopathy.  All other systems reviewed and are  negative.     Allergies  Codeine  Home Medications   Prior to Admission medications   Medication Sig Start Date End Date Taking? Authorizing Provider  aspirin-acetaminophen-caffeine (EXCEDRIN MIGRAINE) [phone] MG per tablet Take 1 tablet by mouth every 6 (six) hours as needed for headache.   Yes Historical Provider, MD  bismuth subsalicylate (PEPTO BISMOL) 262 MG/15ML suspension Take 30 mLs by mouth every 6 (six) hours as needed for indigestion or diarrhea or loose stools.   Yes Historical Provider, MD  esomeprazole (NEXIUM) 40 MG capsule Take 40 mg by mouth daily.   Yes Historical Provider, MD  ibuprofen (ADVIL,MOTRIN) 200 MG tablet Take 800 mg by mouth every 6 (six) hours as needed for moderate pain.    Yes Historical Provider, MD  montelukast (SINGULAIR) 10 MG tablet Take 10 mg by mouth at bedtime as needed (for allergy relief).    Yes Historical Provider, MD  Simethicone (GAS RELIEF) 180 MG CAPS Take 1 capsule by mouth daily as needed (gas relief).   Yes Historical Provider, MD  promethazine (PHENERGAN) 25 MG tablet Take 1 tablet (25 mg total) by mouth every 6 (six) hours as needed for nausea or vomiting. Patient not taking: Reported on 12/21/2014 11/07/14   Bethann Berkshire, MD   BP 162/101 mmHg  Pulse 81  Temp(Src) 98.1 F (36.7 C) (Oral)  Resp 14  Ht  (1.549 m)  Wt 165 lb (74.844 kg)  BMI 31.19 kg/m2  SpO2 98%  LMP 11/07/2014    Physical Exam  Constitutional:  She is oriented to person, place, and time. She appears well-developed and well-nourished. No distress.  HENT:  Head: Normocephalic and atraumatic.  Mouth/Throat: Oropharynx is clear and moist.  Cardiovascular: Normal rate, regular rhythm, normal heart sounds and intact distal pulses.   No murmur heard. Pulmonary/Chest: Effort normal and breath sounds normal. No respiratory distress.  Abdominal: Soft. Normal appearance and bowel sounds are normal. She exhibits no distension and no mass. There is no splenomegaly  or hepatomegaly. There is tenderness in the right upper quadrant and epigastric area. There is no rigidity, no rebound, no guarding and no CVA tenderness.  Musculoskeletal: Normal range of motion. She exhibits no edema.  Neurological: She is alert and oriented to person, place, and time. She exhibits normal muscle tone. Coordination normal.  Skin: Skin is warm and dry.  Nursing note and vitals reviewed.   ED Course  Procedures (including critical care time) Labs Review Labs Reviewed  URINALYSIS, ROUTINE W REFLEX MICROSCOPIC (NOT AT [facility]) - Abnormal; Notable for the following:    APPearance CLOUDY (*)    Specific Gravity, Urine >1.030 (*)    Hgb urine dipstick TRACE (*)    Leukocytes, UA SMALL (*)    All other components within normal limits  COMPREHENSIVE METABOLIC PANEL - Abnormal; Notable for the following:    Glucose, Bld 127 (*)    All other components within normal limits  URINE MICROSCOPIC-ADD ON - Abnormal; Notable for the following:    Squamous Epithelial / LPF MANY (*)    Bacteria, UA FEW (*)    All other components within normal limits  CBC  LIPASE, BLOOD  DIFFERENTIAL  POC URINE PREG, ED    Imaging Review [place] Abdomen Limited  12/21/2014  CLINICAL DATA:  29 year old female with acute right upper quadrant abdominal pain, vomiting and diarrhea for 1 day. EXAM: US ABDOMEN LIMITED - RIGHT UPPER QUADRANT COMPARISON:  None. FINDINGS: Gallbladder: Multiple gallstones are identified, the largest measuring 1.4 cm and is nonmobile at the neck. There is gallbladder wall thickening and positive sonographic Murphy's sign compatible with acute cholecystitis. Common bile duct: Diameter: 5 mm. There is no evidence of intrahepatic biliary dilatation. Liver: No focal lesion identified. Within normal limits in parenchymal echogenicity. There is no evidence of free fluid within the right upper abdomen. IMPRESSION: Sonographic evidence of acute cholecystitis and cholelithiasis. No evidence of  biliary dilatation. Electronically Signed   By: Harmon PierJeffrey  Hu M.D.   On: 12/21/2014 10:27   I have personally reviewed and evaluated these images and lab results as part of my medical decision-making.   EKG Interpretation None      MDM   Final diagnoses:  Calculus of gallbladder with acute cholecystitis without obstruction    1035  Pt is resting comfortably.  No further vomiting since ED arrival.  Last meal at 7:30 pm.  Reviewed labs and US report.  I will consult Dr. Lovell SheehanJenkins.  Recommends to have pt admitted by hospitalist, IV cipro, clear liquids and he will consult.    1130  Consulted Dr. Ardyth HarpsHernandez.  Recommended to add Flagyl, will admit.    Pauline Ausammy Marielys Trinidad, PA-C 12/21/14 1139  Jerelyn ScottMartha Linker, MD 12/21/14 1150

## 2014-12-21 NOTE — H&P (Signed)
[facility]          History and Physical    PCP:   Delman Cheadle, PA-C   EDP: Kem Parkinson, PA  Chief Complaint:  Abdominal pain  HPI: Patient is a 29 year old young woman without past medical history who presents to the hospital today with acute right upper quadrant abdominal pain that began at 3 AM this morning. She describes pain as constant, nonradiating, associated with nausea and vomiting. Upon further questioning she states that she has had, for the past year multiple episodes of right upper quadrant pain that have subsided rather quickly with the use of Pepto-Bismol and Nexium. In the ED she was found to have normal vital signs, normal CBC and CMP. Abdominal ultrasounds showed sonographic evidence of acute cholecystitis and cholelithiasis without evidence of biliary dilatation. EDP has discussed with surgery, Dr. Arnoldo Morale who has in turn requested a medical admission.  Allergies:   Allergies  Allergen Reactions  . Codeine Hives and Nausea And Vomiting     History reviewed. No pertinent past medical history.  History reviewed. No pertinent past surgical history.  Prior to Admission medications   Medication Sig Start Date End Date Taking? Authorizing Provider  aspirin-acetaminophen-caffeine (EXCEDRIN MIGRAINE) [phone] MG per tablet Take 1 tablet by mouth every 6 (six) hours as needed for headache.   Yes Historical Provider, MD  bismuth subsalicylate (PEPTO BISMOL) 262 MG/15ML suspension Take 30 mLs by mouth every 6 (six) hours as needed for indigestion or diarrhea or loose stools.   Yes Historical Provider, MD  esomeprazole (NEXIUM) 40 MG capsule Take 40 mg by mouth daily.   Yes Historical Provider, MD  ibuprofen (ADVIL,MOTRIN) 200 MG tablet Take 800 mg by mouth every 6 (six) hours as needed for moderate pain.    Yes Historical Provider, MD  montelukast (SINGULAIR) 10 MG tablet Take 10 mg by mouth at bedtime as needed (for allergy relief).     Yes Historical Provider, MD  Simethicone (GAS RELIEF) 180 MG CAPS Take 1 capsule by mouth daily as needed (gas relief).   Yes Historical Provider, MD  promethazine (PHENERGAN) 25 MG tablet Take 1 tablet (25 mg total) by mouth every 6 (six) hours as needed for nausea or vomiting. Patient not taking: Reported on 12/21/2014 11/07/14   Milton Ferguson, MD    Social History:  reports that she has never smoked. She does not have any smokeless tobacco history on file. She reports that she drinks about 0.6 oz of alcohol per week. She reports that she does not use illicit drugs.  Family history: No hypertension, diabetes, cancer or heart disease in both parents.  Review of Systems:  Constitutional: Denies fever, chills, diaphoresis, appetite change and fatigue.  HEENT: Denies photophobia, eye pain, redness, hearing loss, ear pain, congestion, sore throat, rhinorrhea, sneezing, mouth sores, trouble swallowing, neck pain, neck stiffness and tinnitus.   Respiratory: Denies SOB, DOE, cough, chest tightness,  and wheezing.   Cardiovascular: Denies chest pain, palpitations and leg swelling.  Gastrointestinal: Denies constipation, blood in stool and abdominal distention.  Genitourinary: Denies dysuria, urgency, frequency, hematuria, flank pain and difficulty urinating.  Endocrine: Denies: hot or cold intolerance, sweats, changes in hair or nails, polyuria, polydipsia. Musculoskeletal: Denies myalgias, back pain, joint swelling, arthralgias and gait problem.  Skin: Denies pallor, rash and wound.  Neurological: Denies dizziness, seizures, syncope, weakness, light-headedness, numbness and headaches.  Hematological: Denies adenopathy. Easy bruising, personal or family  bleeding history  Psychiatric/Behavioral: Denies suicidal ideation, mood changes, confusion, nervousness, sleep disturbance and agitation   Physical Exam: Blood pressure 114/65, pulse 83, temperature 98.3 F (36.8 C), temperature source Oral,  resp. rate 16, height 5' 1"  (1.549 m), weight 74.844 kg (165 lb), last menstrual period 11/07/2014, SpO2 100 %. General: Alert, awake, oriented 3 HEENT: Normocephalic, atraumatic, pupils equal round and reactive to light, check ligaments intact, moist mucous membranes. Neck: Supple, no JVD, no lymphadenopathy, no bruits, no goiter. Next an cardiovascular: Regular rate and rhythm, no murmurs, rubs or gallops. Lungs: Clear to auscultation bilaterally. Abdomen: Soft, exquisitely tender to palpation of the right upper quadrant with guarding and rebound, positive bowel sounds -Extremities: No clubbing, cyanosis or edema, positive pulses. Neurologic: Grossly intact and nonfocal.  Labs on Admission:  Results for orders placed or performed during the hospital encounter of 12/21/14 (from the past 48 hour(s))  CBC     Status: None   Collection Time: 12/21/14  8:15 AM  Result Value Ref Range   WBC 8.0 4.0 - 10.5 K/uL   RBC 4.41 3.87 - 5.11 MIL/uL   Hemoglobin 13.4 12.0 - 15.0 g/dL   HCT 39.0 36.0 - 46.0 %   MCV 88.4 78.0 - 100.0 fL   MCH 30.4 26.0 - 34.0 pg   MCHC 34.4 30.0 - 36.0 g/dL   RDW 12.9 11.5 - 15.5 %   Platelets 195 150 - 400 K/uL  Comprehensive metabolic panel     Status: Abnormal   Collection Time: 12/21/14  8:15 AM  Result Value Ref Range   Sodium 137 135 - 145 mmol/L   Potassium 3.7 3.5 - 5.1 mmol/L   Chloride 104 101 - 111 mmol/L   CO2 25 22 - 32 mmol/L   Glucose, Bld 127 (H) 65 - 99 mg/dL   BUN 10 6 - 20 mg/dL   Creatinine, Ser 0.60 0.44 - 1.00 mg/dL   Calcium 9.0 8.9 - 10.3 mg/dL   Total Protein 7.7 6.5 - 8.1 g/dL   Albumin 4.0 3.5 - 5.0 g/dL   AST 32 15 - 41 U/L   ALT 34 14 - 54 U/L   Alkaline Phosphatase 49 38 - 126 U/L   Total Bilirubin 0.6 0.3 - 1.2 mg/dL   GFR calc non Af Amer >60 >60 mL/min   GFR calc Af Amer >60 >60 mL/min    Comment: (NOTE) The eGFR has been calculated using the CKD EPI equation. This calculation has not been validated in all clinical  situations. eGFR's persistently <60 mL/min signify possible Chronic Kidney Disease.    Anion gap 8 5 - 15  Lipase, blood     Status: None   Collection Time: 12/21/14  8:15 AM  Result Value Ref Range   Lipase 23 11 - 51 U/L    Comment: Please note change in reference range.  Differential     Status: None   Collection Time: 12/21/14  8:15 AM  Result Value Ref Range   Neutrophils Relative % 77 %   Neutro Abs 6.1 1.7 - 7.7 K/uL   Lymphocytes Relative 17 %   Lymphs Abs 1.3 0.7 - 4.0 K/uL   Monocytes Relative 5 %   Monocytes Absolute 0.4 0.1 - 1.0 K/uL   Eosinophils Relative 1 %   Eosinophils Absolute 0.0 0.0 - 0.7 K/uL   Basophils Relative 0 %   Basophils Absolute 0.0 0.0 - 0.1 K/uL  Urinalysis, Routine w reflex microscopic (not at [facility])  Status: Abnormal   Collection Time: 12/21/14  8:58 AM  Result Value Ref Range   Color, Urine YELLOW YELLOW   APPearance CLOUDY (A) CLEAR   Specific Gravity, Urine >1.030 (H) 1.005 - 1.030   pH 5.5 5.0 - 8.0   Glucose, UA NEGATIVE NEGATIVE mg/dL   Hgb urine dipstick TRACE (A) NEGATIVE   Bilirubin Urine NEGATIVE NEGATIVE   Ketones, ur NEGATIVE NEGATIVE mg/dL   Protein, ur NEGATIVE NEGATIVE mg/dL   Urobilinogen, UA 0.2 0.0 - 1.0 mg/dL   Nitrite NEGATIVE NEGATIVE   Leukocytes, UA SMALL (A) NEGATIVE  Urine microscopic-add on     Status: Abnormal   Collection Time: 12/21/14  8:58 AM  Result Value Ref Range   Squamous Epithelial / LPF MANY (A) RARE   WBC, UA 3-6 <3 WBC/hpf   RBC / HPF 0-2 <3 RBC/hpf   Bacteria, UA FEW (A) RARE  POC urine preg, ED     Status: None   Collection Time: 12/21/14  8:59 AM  Result Value Ref Range   Preg Test, Ur NEGATIVE NEGATIVE    Comment:        THE SENSITIVITY OF THIS METHODOLOGY IS >24 mIU/mL     Radiological Exams on Admission: US Abdomen Limited  12/21/2014  CLINICAL DATA:  29 year old female with acute right upper quadrant abdominal pain, vomiting and diarrhea for 1 day. EXAM: US ABDOMEN LIMITED -  RIGHT UPPER QUADRANT COMPARISON:  None. FINDINGS: Gallbladder: Multiple gallstones are identified, the largest measuring 1.4 cm and is nonmobile at the neck. There is gallbladder wall thickening and positive sonographic Murphy's sign compatible with acute cholecystitis. Common bile duct: Diameter: 5 mm. There is no evidence of intrahepatic biliary dilatation. Liver: No focal lesion identified. Within normal limits in parenchymal echogenicity. There is no evidence of free fluid within the right upper abdomen. IMPRESSION: Sonographic evidence of acute cholecystitis and cholelithiasis. No evidence of biliary dilatation. Electronically Signed   By: Margarette Canada M.D.   On: 12/21/2014 10:27    Assessment/Plan Principal Problem:   Acute cholecystitis Active Problems:   Cholelithiasis   Acute cholecystitis with cholelithiasis -Admit to MedSurg bed, start IV Cipro and Flagyl, will allow clears but make nothing by mouth after midnight. -Dr. Arnoldo Morale to assess patient for potential cholecystectomy. -When necessary antiemetics and pain control.  DVT prophylaxis -Lovenox  CODE STATUS -Full code   Time Spent on Admission: 75 minutes  HERNANDEZ ACOSTA,ESTELA [facility] Pager: [phone] 12/21/2014, 2:20 PM

## 2014-12-22 ENCOUNTER — Encounter (HOSPITAL_COMMUNITY)
Admission: EM | Disposition: A | Discharge status: Home / Self Care | Payer: Self-pay | Source: Home / Self Care | Attending: Emergency Medicine

## 2014-12-22 ENCOUNTER — Observation Stay (HOSPITAL_COMMUNITY): Payer: Self-pay | Admitting: Anesthesiology

## 2014-12-22 ENCOUNTER — Observation Stay (HOSPITAL_COMMUNITY): Payer: PRIVATE HEALTH INSURANCE | Admitting: Anesthesiology

## 2014-12-22 ENCOUNTER — Encounter (HOSPITAL_COMMUNITY): Payer: Self-pay | Admitting: Anesthesiology

## 2014-12-22 HISTORY — PX: CHOLECYSTECTOMY: SHX55

## 2014-12-22 LAB — COMPREHENSIVE METABOLIC PANEL
ALBUMIN: 3.2 g/dL — AB (ref 3.5–5.0)
ALT: 27 U/L (ref 14–54)
AST: 22 U/L (ref 15–41)
Alkaline Phosphatase: 44 U/L (ref 38–126)
Anion gap: 5 (ref 5–15)
BUN: 8 mg/dL (ref 6–20)
CHLORIDE: 109 mmol/L (ref 101–111)
CO2: 26 mmol/L (ref 22–32)
CREATININE: 0.67 mg/dL (ref 0.44–1.00)
Calcium: 8.3 mg/dL — ABNORMAL LOW (ref 8.9–10.3)
GFR calc Af Amer: >60 mL/min (ref >60)
GLUCOSE: 100 mg/dL — AB (ref 65–99)
POTASSIUM: 3.6 mmol/L (ref 3.5–5.1)
SODIUM: 140 mmol/L (ref 135–145)
Total Bilirubin: 0.6 mg/dL (ref 0.3–1.2)
Total Protein: 6.3 g/dL — ABNORMAL LOW (ref 6.5–8.1)

## 2014-12-22 LAB — CBC
HEMATOCRIT: 34.9 % — AB (ref 36.0–46.0)
Hemoglobin: 11.4 g/dL — ABNORMAL LOW (ref 12.0–15.0)
MCH: 29.2 pg (ref 26.0–34.0)
MCHC: 32.7 g/dL (ref 30.0–36.0)
MCV: 89.5 fL (ref 78.0–100.0)
PLATELETS: 176 10*3/uL (ref 150–400)
RBC: 3.9 MIL/uL (ref 3.87–5.11)
RDW: 13.1 % (ref 11.5–15.5)
WBC: 4.2 10*3/uL (ref 4.0–10.5)

## 2014-12-22 SURGERY — LAPAROSCOPIC CHOLECYSTECTOMY
Anesthesia: General | Site: Abdomen

## 2014-12-22 MED ORDER — FENTANYL CITRATE (PF) 100 MCG/2ML IJ SOLN
25.0000 ug | INTRAMUSCULAR | Status: DC | PRN
  Administered 2014-12-22 (×2): 50 ug via INTRAVENOUS

## 2014-12-22 MED ORDER — ONDANSETRON HCL 4 MG PO TABS: 4.0000 mg | ORAL_TABLET | Freq: Four times a day (QID) | ORAL | Status: DC | PRN

## 2014-12-22 MED ORDER — ONDANSETRON HCL 4 MG/2ML IJ SOLN
4.0000 mg | Freq: Once | INTRAMUSCULAR | Status: AC
  Administered 2014-12-22: 4 mg via INTRAVENOUS

## 2014-12-22 MED ORDER — PROPOFOL 10 MG/ML IV BOLUS
INTRAVENOUS | Status: AC
  Filled 2014-12-22: qty 20

## 2014-12-22 MED ORDER — OXYCODONE-ACETAMINOPHEN 5-325 MG PO TABS
1.0000 | ORAL_TABLET | ORAL | Status: DC | PRN
  Administered 2014-12-22: 1 via ORAL
  Filled 2014-12-22: qty 2

## 2014-12-22 MED ORDER — BUPIVACAINE HCL (PF) 0.5 % IJ SOLN
INTRAMUSCULAR | Status: AC
Start: 2014-12-22 — End: 2014-12-22
  Filled 2014-12-22: qty 30

## 2014-12-22 MED ORDER — LIDOCAINE HCL (CARDIAC) 20 MG/ML IV SOLN
INTRAVENOUS | Status: DC | PRN
  Administered 2014-12-22: 50 mg via INTRAVENOUS

## 2014-12-22 MED ORDER — OXYCODONE-ACETAMINOPHEN 7.5-325 MG PO TABS: 1.0000 | ORAL_TABLET | ORAL | Status: DC | PRN

## 2014-12-22 MED ORDER — FENTANYL CITRATE (PF) 250 MCG/5ML IJ SOLN
INTRAMUSCULAR | Status: AC
  Filled 2014-12-22: qty 25

## 2014-12-22 MED ORDER — ONDANSETRON HCL 4 MG/2ML IJ SOLN: 4.0000 mg | Freq: Once | INTRAMUSCULAR | Status: DC | PRN

## 2014-12-22 MED ORDER — POVIDONE-IODINE 10 % EX OINT
TOPICAL_OINTMENT | CUTANEOUS | Status: AC
  Filled 2014-12-22: qty 1

## 2014-12-22 MED ORDER — FENTANYL CITRATE (PF) 100 MCG/2ML IJ SOLN
INTRAMUSCULAR | Status: AC
  Filled 2014-12-22: qty 2

## 2014-12-22 MED ORDER — MIDAZOLAM HCL 2 MG/2ML IJ SOLN
1.0000 mg | INTRAMUSCULAR | Status: DC | PRN
  Administered 2014-12-22 (×2): 2 mg via INTRAVENOUS
  Filled 2014-12-22: qty 2

## 2014-12-22 MED ORDER — ARTIFICIAL TEARS OP OINT
TOPICAL_OINTMENT | OPHTHALMIC | Status: AC
  Filled 2014-12-22: qty 7

## 2014-12-22 MED ORDER — ROCURONIUM BROMIDE 100 MG/10ML IV SOLN
INTRAVENOUS | Status: DC | PRN
  Administered 2014-12-22: 10 mg via INTRAVENOUS
  Administered 2014-12-22: 15 mg via INTRAVENOUS

## 2014-12-22 MED ORDER — KETOROLAC TROMETHAMINE 30 MG/ML IJ SOLN
30.0000 mg | Freq: Once | INTRAMUSCULAR | Status: AC
  Administered 2014-12-22: 30 mg via INTRAVENOUS

## 2014-12-22 MED ORDER — SUCCINYLCHOLINE CHLORIDE 20 MG/ML IJ SOLN
INTRAMUSCULAR | Status: AC
  Filled 2014-12-22: qty 1

## 2014-12-22 MED ORDER — BUPIVACAINE HCL (PF) 0.5 % IJ SOLN
INTRAMUSCULAR | Status: DC | PRN
  Administered 2014-12-22: 10 mL

## 2014-12-22 MED ORDER — LACTATED RINGERS IV SOLN
INTRAVENOUS | Status: DC
  Administered 2014-12-22: 09:00:00 via INTRAVENOUS

## 2014-12-22 MED ORDER — ROCURONIUM BROMIDE 50 MG/5ML IV SOLN
INTRAVENOUS | Status: AC
  Filled 2014-12-22: qty 1

## 2014-12-22 MED ORDER — HYDROMORPHONE HCL 1 MG/ML IJ SOLN: 1.0000 mg | INTRAMUSCULAR | Status: DC | PRN

## 2014-12-22 MED ORDER — HEMOSTATIC AGENTS (NO CHARGE) OPTIME
TOPICAL | Status: DC | PRN
  Administered 2014-12-22: 1 via TOPICAL

## 2014-12-22 MED ORDER — NEOSTIGMINE METHYLSULFATE 10 MG/10ML IV SOLN
INTRAVENOUS | Status: DC | PRN
  Administered 2014-12-22: 4 mg via INTRAVENOUS

## 2014-12-22 MED ORDER — POVIDONE-IODINE 10 % OINT PACKET
TOPICAL_OINTMENT | CUTANEOUS | Status: DC | PRN
  Administered 2014-12-22: 1 via TOPICAL

## 2014-12-22 MED ORDER — PROPOFOL 10 MG/ML IV BOLUS
INTRAVENOUS | Status: DC | PRN
  Administered 2014-12-22: 150 mg via INTRAVENOUS

## 2014-12-22 MED ORDER — SUCCINYLCHOLINE CHLORIDE 20 MG/ML IJ SOLN
INTRAMUSCULAR | Status: DC | PRN
  Administered 2014-12-22: 120 mg via INTRAVENOUS

## 2014-12-22 MED ORDER — FENTANYL CITRATE (PF) 100 MCG/2ML IJ SOLN: 25.0000 ug | INTRAMUSCULAR | Status: DC | PRN

## 2014-12-22 MED ORDER — LIDOCAINE HCL (PF) 1 % IJ SOLN
INTRAMUSCULAR | Status: AC
  Filled 2014-12-22: qty 5

## 2014-12-22 MED ORDER — KETOROLAC TROMETHAMINE 30 MG/ML IJ SOLN
INTRAMUSCULAR | Status: AC
  Filled 2014-12-22: qty 1

## 2014-12-22 MED ORDER — MIDAZOLAM HCL 2 MG/2ML IJ SOLN
INTRAMUSCULAR | Status: AC
  Filled 2014-12-22: qty 2

## 2014-12-22 MED ORDER — FENTANYL CITRATE (PF) 100 MCG/2ML IJ SOLN
INTRAMUSCULAR | Status: DC | PRN
  Administered 2014-12-22 (×5): 50 ug via INTRAVENOUS

## 2014-12-22 MED ORDER — GLYCOPYRROLATE 0.2 MG/ML IJ SOLN
INTRAMUSCULAR | Status: DC | PRN
  Administered 2014-12-22: 0.6 mg via INTRAVENOUS

## 2014-12-22 MED ORDER — SODIUM CHLORIDE 0.9 % IR SOLN
Status: DC | PRN
  Administered 2014-12-22: 1000 mL

## 2014-12-22 MED ORDER — GLYCOPYRROLATE 0.2 MG/ML IJ SOLN
INTRAMUSCULAR | Status: AC
  Filled 2014-12-22: qty 3

## 2014-12-22 MED ORDER — ONDANSETRON HCL 4 MG/2ML IJ SOLN
INTRAMUSCULAR | Status: AC
  Filled 2014-12-22: qty 2

## 2014-12-22 SURGICAL SUPPLY — 41 items
APPLIER CLIP LAPSCP 10X32 DD (CLIP) ×2 IMPLANT
BAG HAMPER (MISCELLANEOUS) ×2 IMPLANT
CHLORAPREP W/TINT 26ML (MISCELLANEOUS) ×2 IMPLANT
CLOTH BEACON ORANGE TIMEOUT ST (SAFETY) ×2 IMPLANT
COVER LIGHT HANDLE STERIS (MISCELLANEOUS) ×4 IMPLANT
DECANTER SPIKE VIAL GLASS SM (MISCELLANEOUS) ×2 IMPLANT
ELECT REM PT RETURN 9FT ADLT (ELECTROSURGICAL) ×2
ELECTRODE REM PT RTRN 9FT ADLT (ELECTROSURGICAL) ×1 IMPLANT
FILTER SMOKE EVAC LAPAROSHD (FILTER) ×2 IMPLANT
FORMALIN 10 PREFIL 120ML (MISCELLANEOUS) ×2 IMPLANT
GLOVE BIOGEL PI IND STRL 7.0 (GLOVE) ×2 IMPLANT
GLOVE BIOGEL PI INDICATOR 7.0 (GLOVE) ×2
GLOVE ECLIPSE 6.5 STRL STRAW (GLOVE) ×4 IMPLANT
GLOVE EXAM NITRILE MD LF STRL (GLOVE) ×2 IMPLANT
GLOVE SURG SS PI 7.5 STRL IVOR (GLOVE) ×2 IMPLANT
GOWN STRL REUS W/ TWL XL LVL3 (GOWN DISPOSABLE) ×1 IMPLANT
GOWN STRL REUS W/TWL LRG LVL3 (GOWN DISPOSABLE) ×4 IMPLANT
GOWN STRL REUS W/TWL XL LVL3 (GOWN DISPOSABLE) ×1
HEMOSTAT SNOW SURGICEL 2X4 (HEMOSTASIS) ×2 IMPLANT
INST SET LAPROSCOPIC AP (KITS) ×2 IMPLANT
IV NS IRRIG 3000ML ARTHROMATIC (IV SOLUTION) IMPLANT
KIT ROOM TURNOVER APOR (KITS) ×2 IMPLANT
MANIFOLD NEPTUNE II (INSTRUMENTS) ×2 IMPLANT
NEEDLE INSUFFLATION 14GA 120MM (NEEDLE) ×2 IMPLANT
NS IRRIG 1000ML POUR BTL (IV SOLUTION) ×2 IMPLANT
PACK LAP CHOLE LZT030E (CUSTOM PROCEDURE TRAY) ×2 IMPLANT
PAD ARMBOARD 7.5X6 YLW CONV (MISCELLANEOUS) ×2 IMPLANT
POUCH SPECIMEN RETRIEVAL 10MM (ENDOMECHANICALS) ×2 IMPLANT
SET BASIN LINEN APH (SET/KITS/TRAYS/PACK) ×2 IMPLANT
SET TUBE IRRIG SUCTION NO TIP (IRRIGATION / IRRIGATOR) IMPLANT
SLEEVE ENDOPATH XCEL 5M (ENDOMECHANICALS) ×2 IMPLANT
SPONGE GAUZE 2X2 8PLY STRL LF (GAUZE/BANDAGES/DRESSINGS) ×8 IMPLANT
STAPLER VISISTAT (STAPLE) ×2 IMPLANT
SUT VICRYL 0 UR6 27IN ABS (SUTURE) ×2 IMPLANT
TAPE CLOTH SURG 4X10 WHT LF (GAUZE/BANDAGES/DRESSINGS) ×2 IMPLANT
TROCAR ENDO BLADELESS 11MM (ENDOMECHANICALS) ×2 IMPLANT
TROCAR XCEL NON-BLD 5MMX100MML (ENDOMECHANICALS) ×2 IMPLANT
TROCAR XCEL UNIV SLVE 11M 100M (ENDOMECHANICALS) ×2 IMPLANT
TUBING INSUFFLATION (TUBING) ×2 IMPLANT
WARMER LAPAROSCOPE (MISCELLANEOUS) ×2 IMPLANT
YANKAUER SUCT 12FT TUBE ARGYLE (SUCTIONS) ×2 IMPLANT

## 2014-12-22 NOTE — Consult Note (Signed)
Reason for Consult: Biliary colic, cholelithiasis Referring Physician: Hospitalist  Mandy Stewart is an 29 y.o. female.  HPI: Patient is a 29 year old white female who presents with right upper quadrant abdominal pain, nausea, and vomiting. She was found on ultrasound the gallbladder to have cholelithiasis. Her common bile ducts was within normal limits. She was admitted to the hospital for control of her pain and nausea. She was started on IV ciprofloxacin. Her pain is somewhat eased since her admission.  History reviewed. No pertinent past medical history.  History reviewed. No pertinent past surgical history.  History reviewed. No pertinent family history.  Social History:  reports that she has never smoked. She does not have any smokeless tobacco history on file. She reports that she drinks about 0.6 oz of alcohol per week. She reports that she does not use illicit drugs.  Allergies:  Allergies  Allergen Reactions  . Codeine Hives and Nausea And Vomiting    Medications: I have reviewed the patient's current medications.  Results for orders placed or performed during the hospital encounter of 12/21/14 (from the past 48 hour(s))  CBC     Status: None   Collection Time: 12/21/14  8:15 AM  Result Value Ref Range   WBC 8.0 4.0 - 10.5 K/uL   RBC 4.41 3.87 - 5.11 MIL/uL   Hemoglobin 13.4 12.0 - 15.0 g/dL   HCT 39.0 36.0 - 46.0 %   MCV 88.4 78.0 - 100.0 fL   MCH 30.4 26.0 - 34.0 pg   MCHC 34.4 30.0 - 36.0 g/dL   RDW 12.9 11.5 - 15.5 %   Platelets 195 150 - 400 K/uL  Comprehensive metabolic panel     Status: Abnormal   Collection Time: 12/21/14  8:15 AM  Result Value Ref Range   Sodium 137 135 - 145 mmol/L   Potassium 3.7 3.5 - 5.1 mmol/L   Chloride 104 101 - 111 mmol/L   CO2 25 22 - 32 mmol/L   Glucose, Bld 127 (H) 65 - 99 mg/dL   BUN 10 6 - 20 mg/dL   Creatinine, Ser 0.60 0.44 - 1.00 mg/dL   Calcium 9.0 8.9 - 10.3 mg/dL   Total Protein 7.7 6.5 - 8.1 g/dL   Albumin 4.0 3.5  - 5.0 g/dL   AST 32 15 - 41 U/L   ALT 34 14 - 54 U/L   Alkaline Phosphatase 49 38 - 126 U/L   Total Bilirubin 0.6 0.3 - 1.2 mg/dL   GFR calc non Af Amer >60 >60 mL/min   GFR calc Af Amer >60 >60 mL/min    Comment: (NOTE) The eGFR has been calculated using the CKD EPI equation. This calculation has not been validated in all clinical situations. eGFR's persistently <60 mL/min signify possible Chronic Kidney Disease.    Anion gap 8 5 - 15  Lipase, blood     Status: None   Collection Time: 12/21/14  8:15 AM  Result Value Ref Range   Lipase 23 11 - 51 U/L    Comment: Please note change in reference range.  Differential     Status: None   Collection Time: 12/21/14  8:15 AM  Result Value Ref Range   Neutrophils Relative % 77 %   Neutro Abs 6.1 1.7 - 7.7 K/uL   Lymphocytes Relative 17 %   Lymphs Abs 1.3 0.7 - 4.0 K/uL   Monocytes Relative 5 %   Monocytes Absolute 0.4 0.1 - 1.0 K/uL   Eosinophils Relative 1 %  Eosinophils Absolute 0.0 0.0 - 0.7 K/uL   Basophils Relative 0 %   Basophils Absolute 0.0 0.0 - 0.1 K/uL  Urinalysis, Routine w reflex microscopic (not at [facility])     Status: Abnormal   Collection Time: 12/21/14  8:58 AM  Result Value Ref Range   Color, Urine YELLOW YELLOW   APPearance CLOUDY (A) CLEAR   Specific Gravity, Urine >1.030 (H) 1.005 - 1.030   pH 5.5 5.0 - 8.0   Glucose, UA NEGATIVE NEGATIVE mg/dL   Hgb urine dipstick TRACE (A) NEGATIVE   Bilirubin Urine NEGATIVE NEGATIVE   Ketones, ur NEGATIVE NEGATIVE mg/dL   Protein, ur NEGATIVE NEGATIVE mg/dL   Urobilinogen, UA 0.2 0.0 - 1.0 mg/dL   Nitrite NEGATIVE NEGATIVE   Leukocytes, UA SMALL (A) NEGATIVE  Urine microscopic-add on     Status: Abnormal   Collection Time: 12/21/14  8:58 AM  Result Value Ref Range   Squamous Epithelial / LPF MANY (A) RARE   WBC, UA 3-6 <3 WBC/hpf   RBC / HPF 0-2 <3 RBC/hpf   Bacteria, UA FEW (A) RARE  POC urine preg, ED     Status: None   Collection Time: 12/21/14  8:59 AM  Result  Value Ref Range   Preg Test, Ur NEGATIVE NEGATIVE    Comment:        THE SENSITIVITY OF THIS METHODOLOGY IS >24 mIU/mL     US Abdomen Limited  12/21/2014  CLINICAL DATA:  29 year old female with acute right upper quadrant abdominal pain, vomiting and diarrhea for 1 day. EXAM: US ABDOMEN LIMITED - RIGHT UPPER QUADRANT COMPARISON:  None. FINDINGS: Gallbladder: Multiple gallstones are identified, the largest measuring 1.4 cm and is nonmobile at the neck. There is gallbladder wall thickening and positive sonographic Murphy's sign compatible with acute cholecystitis. Common bile duct: Diameter: 5 mm. There is no evidence of intrahepatic biliary dilatation. Liver: No focal lesion identified. Within normal limits in parenchymal echogenicity. There is no evidence of free fluid within the right upper abdomen. IMPRESSION: Sonographic evidence of acute cholecystitis and cholelithiasis. No evidence of biliary dilatation. Electronically Signed   By: Margarette Canada M.D.   On: 12/21/2014 10:27    ROS: See chart Blood pressure 116/64, pulse 70, temperature 98.8 F (37.1 C), temperature source Oral, resp. rate 20, height _0  (1.549 m), weight 75.751 kg (167 lb), last menstrual period 11/07/2014, SpO2 97 %. Physical Exam: Pleasant white female in no acute distress. Abdomen is soft with tenderness noted in the right upper quadrant to palpation. No hepatosplenomegaly, masses, or hernias identified.  Assessment/Plan: Impression: Cholecystitis, cholelithiasis Plan: Patient will undergo a laparoscopic cholecystectomy today. The risks and benefits of the procedure including bleeding, infection, hepatobiliary injury, and the possibility of an open procedure were fully explained to the patient, who gave informed consent.  Mandy Stewart A 12/22/2014, 7:36 AM

## 2014-12-22 NOTE — Op Note (Signed)
Patient:  Mandy Stewart  DOB:  January 29, 1986  MRN:  [id number]   Preop Diagnosis:  Cholecystitis, cholelithiasis  Postop Diagnosis:  Same  Procedure:  Laparoscopic cholecystectomy  Surgeon:  Franky MachoMark Aunika Kirsten, M.D.  Anes:  Gen. endotracheal  Indications:  Patient is a 29 year old white female who presents with cholecystitis secondary to cholelithiasis. The risks and benefits of the procedure including bleeding, infection, hepatobiliary injury, and the possibility of an open procedure were fully explained to the patient, who gave informed consent.  Procedure note:  The patient was placed the supine position. After induction of general endotracheal anesthesia, the abdomen was prepped and draped using the usual sterile technique with DuraPrep. Surgical site confirmation was performed.  A supraumbilical incision was made down to the fascia. A Veress needle was introduced into the abdominal cavity and confirmation of placement was done using the saline drop test. The abdomen was then insufflated to 16 mmHg pressure. An 11 mm trocar was introduced into the abdominal cavity under direct visualization without difficulty. The patient was placed in reverse Trendelenburg position and an additional 11 mm trocar was placed the epigastric region and 5 mm trochars were placed the right upper quadrant and right flank regions. The liver was inspected and noted to be within normal limits. The gallbladder was retracted in a dynamic fashion in order to provide critical value of the triangle of Calot.  The cystic duct was first identified. Its junction to the infundibulum was fully identified. Endoclips placed proximally and distally on the cystic duct, and the cystic duct was divided. This was likewise done the cystic artery. The gallbladder was freed away from the gallbladder fossa using Bovie electrocautery. The gallbladder was delivered to the epigastric trocar site using an Endo Catch bag. The gallbladder fossa was  inspected and no abnormal bleeding or bile leakage was noted. Surgicel is placed the gallbladder fossa. All fluid and air were then evacuated from the abdominal cavity prior to removal of the trochars.  All wounds were irrigated with normal saline. All wounds were injected with 0.5% Sensorcaine. The supraumbilical fascia was reapproximated using an 0 Vicryl interrupted suture. All skin incisions were closed using staples. Betadine ointment and dry sterile dressings were applied.  All tape and needle counts were correct at the end of the procedure. The patient was extubated in the operating room and transferred to PACU in stable condition.  Complications:  None  EBL:  Minimal  Specimen:  Gallbladder

## 2014-12-22 NOTE — Discharge Instructions (Signed)
Laparoscopic Cholecystectomy, Care After °Refer to this sheet in the next few weeks. These instructions provide you with information about caring for yourself after your procedure. Your health care provider may also give you more specific instructions. Your treatment has been planned according to current medical practices, but problems sometimes occur. Call your health care provider if you have any problems or questions after your procedure. °WHAT TO EXPECT AFTER THE PROCEDURE °After your procedure, it is common to have: °· Pain at your incision sites. You will be given pain medicines to control your pain. °· Mild nausea or vomiting. This should improve after the first 24 hours. °· Bloating and possible shoulder pain from the gas that was used during the procedure. This will improve after the first 24 hours. °HOME CARE INSTRUCTIONS °Incision Care °· Follow instructions from your health care provider about how to take care of your incisions. Make sure you: °¨ Wash your hands with soap and water before you change your bandage (dressing). If soap and water are not available, use hand sanitizer. °¨ Change your dressing as told by your health care provider. °¨ Leave stitches (sutures), skin glue, or adhesive strips in place. These skin closures may need to be in place for 2 weeks or longer. If adhesive strip edges start to loosen and curl up, you may trim the loose edges. Do not remove adhesive strips completely unless your health care provider tells you to do that. °· Do not take baths, swim, or use a hot tub until your health care provider approves. Ask your health care provider if you can take showers. You may only be allowed to take sponge baths for bathing. °General Instructions °· Take over-the-counter and prescription medicines only as told by your health care provider. °· Do not drive or operate heavy machinery while taking prescription pain medicine. °· Return to your normal diet as told by your health care  provider. °· Do not lift anything that is heavier than 10 lb (4.5 kg). °· Do not play contact sports for one week or until your health care provider approves. °SEEK MEDICAL CARE IF:  °· You have redness, swelling, or pain at the site of your incision. °· You have fluid, blood, or pus coming from your incision. °· You notice a bad smell coming from your incision area. °· Your surgical incisions break open. °· You have a fever. °SEEK IMMEDIATE MEDICAL CARE IF: °· You develop a rash. °· You have difficulty breathing. °· You have chest pain. °· You have increasing pain in your shoulders (shoulder strap areas). °· You faint or have dizzy episodes while you are standing. °· You have severe pain in your abdomen. °· You have nausea or vomiting that lasts for more than one day. °  °This information is not intended to replace advice given to you by your health care provider. Make sure you discuss any questions you have with your health care provider. °  °Document Released: 02/14/2005 Document Revised: 11/05/2014 Document Reviewed: 09/26/2012 °Elsevier Interactive Patient Education ©2016 Elsevier Inc[employer] ° °

## 2014-12-22 NOTE — Anesthesia Postprocedure Evaluation (Signed)
  Anesthesia Post-op Note Late Entry Patient: Mandy Stewart  Procedure(s) Performed: Procedure(s): LAPAROSCOPIC CHOLECYSTECTOMY (N/A)  Patient Location: PACU  Anesthesia Type:General  Level of Consciousness: awake, alert , oriented and patient cooperative  Airway and Oxygen Therapy: Patient Spontanous Breathing  Post-op Pain: mild  Post-op Assessment: Post-op Vital signs reviewed, Patient's Cardiovascular Status Stable, Respiratory Function Stable, Patent Airway, No signs of Nausea or vomiting and Pain level controlled              Post-op Vital Signs: Reviewed and stable  Last Vitals:  Filed Vitals:   12/22/14 1100  BP: 126/71  Pulse: 63  Temp:   Resp: 11    Complications: No apparent anesthesia complications

## 2014-12-22 NOTE — Progress Notes (Signed)
Pt complained of nausea and pain.  Meds given were effective.

## 2014-12-22 NOTE — Progress Notes (Signed)
Pt IV removed, tolerated well.  Reviewed discharge instructions with pt, gave paper prescriptions.  Answered all questions at this time.

## 2014-12-22 NOTE — Care Management Note (Signed)
Case Management Note  Patient Details  Name: Mandy Stewart MRN: [id number] Date of Birth: Aug 15, 1985  Subjective/Objective:                  Pt admitted for cholesystitis. Pt s/p lap chole today. Pt from home and ind with ADL's. Pt is uninsured.   Action/Plan: Pt discharging home today with self care. Pt's mother at the bedside. [facility] available [facility] and narcotics. Pt not eligible [facility]. No further CM needs noted.   Expected Discharge Date:                  Expected Discharge Plan:  Home/Self Care  In-House Referral:  Financial Counselor  Discharge planning Services  CM Consult  Post Acute Care Choice:  NA Choice offered to:  NA  DME Arranged:    DME Agency:     HH Arranged:    HH Agency:     Status of Service:  Completed, signed off  Medicare Important Message Given:    Date Medicare IM Given:    Medicare IM give by:    Date Additional Medicare IM Given:    Additional Medicare Important Message give by:     If discussed at Long Length of Stay Meetings, dates discussed:    Additional Comments:  Malcolm MetroChildress, Jensyn Shave Demske, RN 12/22/2014, 11:46 AM

## 2014-12-22 NOTE — Discharge Summary (Signed)
Physician Discharge Summary  Patient ID: Mandy Stewart MRN: [id number] DOB/AGE: Jul 25, 1985 29 y.o.  Admit date: 12/21/2014 Discharge date: 12/22/2014  Admission Diagnoses: Cholecystitis, cholelithiasis  Discharge Diagnoses: Same Principal Problem:   Acute cholecystitis Active Problems:   Cholelithiasis   Discharged Condition: good  Hospital Course: Patient is a 29 year old white female who presented emergency room with worsening right upper quadrant abdominal pain, nausea, and vomiting. Ultrasound the gallbladder revealed cholecystitis with cholelithiasis. The patient was admitted to the hospital and started on IV ciprofloxacin. She also was brought into the hospital for pain and nausea control. She stopped underwent laparoscopic cholecystectomy on 12/22/2014. She tolerated the procedure well. Her postoperative course was unremarkable. Her diet was advanced at difficulty. She is being discharged home on 12/22/2014 in good and improving condition.  Treatments: surgery: Laparoscopic cholecystectomy on 12/22/2014  Discharge Exam: Blood pressure 126/71, pulse 63, temperature 98.5 F (36.9 C), temperature source Oral, resp. rate 11, height 5\' 1"  (1.549 m), weight 75.751 kg (167 lb), last menstrual period 11/07/2014, SpO2 100 %. General appearance: alert, cooperative and no distress Resp: clear to auscultation bilaterally Cardio: regular rate and rhythm, S1, S2 normal, no murmur, click, rub or gallop GI: Soft, dressings dry and intact.  Disposition: 01-Home or Self Care     Medication List    TAKE these medications        aspirin-acetaminophen-caffeine 250-250-65 MG tablet  Commonly known as:  EXCEDRIN MIGRAINE  Take 1 tablet by mouth every 6 (six) hours as needed for headache.     bismuth subsalicylate 262 MG/15ML suspension  Commonly known as:  PEPTO BISMOL  Take 30 mLs by mouth every 6 (six) hours as needed for indigestion or diarrhea or loose stools.     esomeprazole 40 MG  capsule  Commonly known as:  NEXIUM  Take 40 mg by mouth daily.     GAS RELIEF 180 MG Caps  Generic drug:  Simethicone  Take 1 capsule by mouth daily as needed (gas relief).     ibuprofen 200 MG tablet  Commonly known as:  ADVIL,MOTRIN  Take 800 mg by mouth every 6 (six) hours as needed for moderate pain.     montelukast 10 MG tablet  Commonly known as:  SINGULAIR  Take 10 mg by mouth at bedtime as needed (for allergy relief).     ondansetron 4 MG tablet  Commonly known as:  ZOFRAN  Take 1 tablet (4 mg total) by mouth every 6 (six) hours as needed for nausea or vomiting.     oxyCODONE-acetaminophen 7.5-325 MG tablet  Commonly known as:  PERCOCET  Take 1-2 tablets by mouth every 4 (four) hours as needed.     promethazine 25 MG tablet  Commonly known as:  PHENERGAN  Take 1 tablet (25 mg total) by mouth every 6 (six) hours as needed for nausea or vomiting.           Follow-up Information    Follow up with Dalia HeadingJENKINS,Jonna Dittrich A, MD. Schedule an appointment as soon as possible for a visit on 12/30/2014.   Specialty:  General Surgery   Contact information:   [address] [place] [address] [phone]       Signed: Franky MachoJENKINS,Vear Staton A 12/22/2014, 11:36 AM

## 2014-12-22 NOTE — Anesthesia Preprocedure Evaluation (Signed)
Anesthesia Evaluation  Patient identified by MRN, date of birth, ID band Patient awake    Reviewed: Allergy & Precautions, NPO status , Patient's Chart, lab work & pertinent test results  Airway Mallampati: I  TM Distance: >3 FB     Dental  (+) Teeth Intact, Dental Advisory Given   Pulmonary neg pulmonary ROS,    breath sounds clear to auscultation       Cardiovascular negative cardio ROS   Rhythm:Regular Rate:Normal     Neuro/Psych    GI/Hepatic   Endo/Other    Renal/GU      Musculoskeletal   Abdominal   Peds  Hematology   Anesthesia Other Findings   Reproductive/Obstetrics                             Anesthesia Physical Anesthesia Plan  ASA: I  Anesthesia Plan: General   Post-op Pain Management:    Induction: Intravenous, Rapid sequence and Cricoid pressure planned  Airway Management Planned:   Additional Equipment:   Intra-op Plan:   Post-operative Plan: Extubation in OR  Informed Consent: I have reviewed the patients History and Physical, chart, labs and discussed the procedure including the risks, benefits and alternatives for the proposed anesthesia with the patient or authorized representative who has indicated his/her understanding and acceptance.     Plan Discussed with:   Anesthesia Plan Comments:         Anesthesia Quick Evaluation

## 2014-12-22 NOTE — Anesthesia Procedure Notes (Signed)
Procedure Name: Intubation Date/Time: 12/22/2014 9:44 AM Performed by: Pernell DupreADAMS, AMY A Pre-anesthesia Checklist: Patient identified, Patient being monitored, Timeout performed, Emergency Drugs available and Suction available Patient Re-evaluated:Patient Re-evaluated prior to inductionOxygen Delivery Method: Circle System Utilized Preoxygenation: Pre-oxygenation with 100% oxygen Intubation Type: IV induction, Rapid sequence and Cricoid Pressure applied Laryngoscope Size: 3 and Miller Grade View: Grade I Tube type: Oral Tube size: 7.0 mm Number of attempts: 1 Airway Equipment and Method: Stylet Placement Confirmation: ETT inserted through vocal cords under direct vision,  positive ETCO2 and breath sounds checked- equal and bilateral Secured at: 21 cm Tube secured with: Tape Dental Injury: Teeth and Oropharynx as per pre-operative assessment

## 2014-12-22 NOTE — Transfer of Care (Signed)
Immediate Anesthesia Transfer of Care Note  Patient: Mandy Stewart  Procedure(s) Performed: Procedure(s): LAPAROSCOPIC CHOLECYSTECTOMY (N/A)  Patient Location: PACU  Anesthesia Type:General  Level of Consciousness: awake, alert , oriented and patient cooperative  Airway & Oxygen Therapy: Patient Spontanous Breathing and Patient connected to face mask oxygen  Post-op Assessment: Report given to RN and Post -op Vital signs reviewed and stable  Post vital signs: Reviewed and stable  Last Vitals:  Filed Vitals:   12/22/14 0930  BP: 134/83  Pulse:   Temp:   Resp: 13    Complications: No apparent anesthesia complications

## 2014-12-23 ENCOUNTER — Encounter (HOSPITAL_COMMUNITY): Payer: Self-pay | Admitting: General Surgery

## 2015-04-28 ENCOUNTER — Emergency Department (HOSPITAL_COMMUNITY)
Admission: EM | Admit: 2015-04-28 | Discharge: 2015-04-28 | Disposition: A | Discharge status: Home / Self Care | Payer: Self-pay | Attending: Emergency Medicine | Admitting: Emergency Medicine

## 2015-04-28 ENCOUNTER — Emergency Department (HOSPITAL_COMMUNITY): Payer: Self-pay

## 2015-04-28 ENCOUNTER — Encounter (HOSPITAL_COMMUNITY): Payer: Self-pay

## 2015-04-28 DIAGNOSIS — Z3202 Encounter for pregnancy test, result negative: Secondary | ICD-10-CM | POA: Insufficient documentation

## 2015-04-28 DIAGNOSIS — M436 Torticollis: Secondary | ICD-10-CM | POA: Insufficient documentation

## 2015-04-28 DIAGNOSIS — Z79899 Other long term (current) drug therapy: Secondary | ICD-10-CM | POA: Insufficient documentation

## 2015-04-28 LAB — POC URINE PREG, ED: Preg Test, Ur: NEGATIVE

## 2015-04-28 MED ORDER — METHOCARBAMOL 500 MG PO TABS
1000.0000 mg | ORAL_TABLET | Freq: Once | ORAL | Status: AC
  Administered 2015-04-28: 1000 mg via ORAL
  Filled 2015-04-28: qty 2

## 2015-04-28 MED ORDER — TRAMADOL HCL 50 MG PO TABS: 50.0000 mg | ORAL_TABLET | Freq: Four times a day (QID) | ORAL | Status: DC | PRN

## 2015-04-28 MED ORDER — METHOCARBAMOL 500 MG PO TABS: 1000.0000 mg | ORAL_TABLET | Freq: Four times a day (QID) | ORAL | Status: AC

## 2015-04-28 NOTE — Discharge Instructions (Signed)
Acute Torticollis Torticollis is a condition in which the muscles of the neck tighten (contract) abnormally, causing the neck to twist and the head to move into an unnatural position. Torticollis that develops suddenly is called acute torticollis. If torticollis becomes chronic and is left untreated, the face and neck can become deformed. CAUSES This condition may be caused by:  Sleeping in an awkward position (common).  Extending or twisting the neck muscles beyond their normal position.  Infection. In some cases, the cause may not be known. SYMPTOMS Symptoms of this condition include:  An unnatural position of the head.  Neck pain.  A limited ability to move the neck.  Twisting of the neck to one side. DIAGNOSIS This condition is diagnosed with a physical exam. You may also have imaging tests, such as an X-ray, CT scan, or MRI. TREATMENT Treatment for this condition involves trying to relax the neck muscles. It may include:  Medicines or shots.  Physical therapy.  Surgery. This may be done in severe cases. HOME CARE INSTRUCTIONS  Take medicines only as directed by your health care provider.  Do stretching exercises and massage your neck as directed by your health care provider.  Keep all follow-up visits as directed by your health care provider. This is important. SEEK MEDICAL CARE IF:  You develop a fever. SEEK IMMEDIATE MEDICAL CARE IF:  You develop difficulty breathing.  You develop noisy breathing (stridor).  You start drooling.  You have trouble swallowing or have pain with swallowing.  You develop numbness or weakness in your hands or feet.  You have changes in your speech, understanding, or vision.  Your pain gets worse.   This information is not intended to replace advice given to you by your health care provider. Make sure you discuss any questions you have with your health care provider.   Document Released: 02/12/2000 Document Revised:  07/01/2014 Document Reviewed: 02/10/2014 Elsevier Interactive Patient Education 2016 [employer].   Continue using your aleve for inflammation.  Apply a heating pad 20 minutes 3 times daily followed by range of motion stretching to lengthen the muscles in your neck.  You may take the tramadol prescribed for pain relief.  This will make you drowsy - do not drive within 4 hours of taking this medication.

## 2015-04-28 NOTE — ED Notes (Signed)
Patient reports of left sided neck pain that radiates from left shoulder into left neck. States pain is worse when she turns head to left. States she was helping family putting boxes in atic Saturday.

## 2015-04-30 NOTE — ED Provider Notes (Signed)
CSN: [id number]     Arrival date & time 04/28/15  1931 History   First MD Initiated Contact with Patient 04/28/15 2112     Chief Complaint  Patient presents with  . Neck Pain     (Consider location/radiation/quality/duration/timing/severity/associated sxs/prior Treatment) Patient is a 30 y.o. female presenting with neck pain. The history is provided by the patient and the spouse.  Neck Pain Pain location:  L side Quality:  Aching and cramping Pain radiates to:  L scapula and L shoulder Pain severity:  Moderate Pain is:  Same all the time Onset quality:  Sudden Duration:  2 days Timing:  Constant Progression:  Unchanged Chronicity:  New Context: lifting a heavy object   Context: not fall, not jumping from heights and not recent injury   Context comment:  States was helping left boxes into the attic 2 days ago. Relieved by:  Nothing Worsened by:  Position (worse with leftward rotation) Ineffective treatments:  Heat and NSAIDs Associated symptoms: no chest pain, no fever, no numbness, no paresis, no tingling and no weakness   Risk factors: no hx of head and neck radiation, no hx of spinal trauma, no recent head injury and no recurrent falls     Past Medical History  Diagnosis Date  . Medical history non-contributory    Past Surgical History  Procedure Laterality Date  . Cholecystectomy N/A 12/22/2014    Procedure: LAPAROSCOPIC CHOLECYSTECTOMY;  Surgeon: Franky Macho, MD;  Location: [facility];  Service: General;  Laterality: N/A;   No family history on file. Social History  Substance Use Topics  . Smoking status: Never Smoker   . Smokeless tobacco: None  . Alcohol Use: 0.6 oz/week    1 Cans of beer per week     Comment: occasionally   OB History    Gravida Para Term Preterm AB TAB SAB Ectopic Multiple Living   0     Review of Systems  Constitutional: Negative for fever and chills.  Cardiovascular: Negative for chest pain.  Musculoskeletal: Positive for  neck pain. Negative for myalgias.  Neurological: Negative for tingling, weakness and numbness.      Allergies  Codeine  Home Medications   Prior to Admission medications   Medication Sig Start Date End Date Taking? Authorizing Provider  esomeprazole (NEXIUM) 40 MG capsule Take 40 mg by mouth daily.   Yes Historical Provider, MD  ibuprofen (ADVIL,MOTRIN) 200 MG tablet Take 800 mg by mouth every 6 (six) hours as needed for moderate pain.    Yes Historical Provider, MD  montelukast (SINGULAIR) 10 MG tablet Take 10 mg by mouth at bedtime as needed (for allergy relief).    Yes Historical Provider, MD  naproxen sodium (ALEVE) 220 MG tablet Take 220 mg by mouth daily as needed (for pain).   Yes Historical Provider, MD  ondansetron (ZOFRAN) 4 MG tablet Take 1 tablet (4 mg total) by mouth every 6 (six) hours as needed for nausea or vomiting. 12/22/14  Yes Franky Macho, MD  oxyCODONE-acetaminophen (PERCOCET) 7.5-325 MG tablet Take 1-2 tablets by mouth every 4 (four) hours as needed. 12/22/14  Yes Franky Macho, MD  methocarbamol (ROBAXIN) 500 MG tablet Take 2 tablets (1,000 mg total) by mouth 4 (four) times daily. 04/28/15 05/08/15  Burgess Amor, PA-C  traMADol (ULTRAM) 50 MG tablet Take 1 tablet (50 mg total) by mouth every 6 (six) hours as needed. 04/28/15   Burgess Amor, PA-C   BP 125/80  mmHg  Pulse 73  Temp(Src) 98.7 F (37.1 C) (Oral)  Resp 17  Ht 5' 1.5" (1.562 m)  Wt 77.111 kg  BMI 31.60 kg/m2  SpO2 100%  LMP  (LMP Unknown) Physical Exam  Constitutional: She appears well-developed and well-nourished.  HENT:  Head: Normocephalic.  Eyes: Conjunctivae are normal.  Neck: Normal range of motion. Neck supple.  Cardiovascular: Normal rate and intact distal pulses.   Pedal pulses normal.  Pulmonary/Chest: Effort normal.  Abdominal: Soft. Bowel sounds are normal. She exhibits no distension and no mass.  Musculoskeletal: Normal range of motion. She exhibits no edema.       Cervical back: She  exhibits tenderness and spasm.       Thoracic back: Normal.       Lumbar back: Normal.       Back:  Neurological: She is alert. She has normal strength. She displays no atrophy and no tremor. No sensory deficit. Gait normal.  Reflex Scores:      Patellar reflexes are 2+ on the right side and 2+ on the left side.      Achilles reflexes are 2+ on the right side and 2+ on the left side. No strength deficit noted in hip and knee flexor and extensor muscle groups.  Ankle flexion and extension intact.  Skin: Skin is warm and dry.  Psychiatric: She has a normal mood and affect.  Nursing note and vitals reviewed.   ED Course  Procedures (including critical care time) Labs Review Labs Reviewed  POC URINE PREG, ED    Imaging Review Dg Cervical Spine Complete  04/28/2015  CLINICAL DATA:  Patient was lifting boxes into attic on Sunday and felt a sharp pain go up left shoulder into neck followed by a warm sensation. Best images due to patient moving EXAM: CERVICAL SPINE - COMPLETE 4+ VIEW COMPARISON:  None. FINDINGS: There is no evidence of cervical spine fracture or prevertebral soft tissue swelling. Alignment is normal. No other significant bone abnormalities are identified. IMPRESSION: Negative cervical spine radiographs. Electronically Signed   By: Amie Portland M.D.   On: 04/28/2015 22:58   I have personally reviewed and evaluated these images and lab results as part of my medical decision-making.   EKG Interpretation None      MDM   Final diagnoses:  Torticollis, acute    Pt advised heat tx followed by ROM stretches bid to tid.  Continue nsaid, added robaxin and ultram.  Advised recheck by pcp if not improving over the next 5 days.    Burgess Amor, PA-C 04/30/15 1318  Vanetta Mulders, MD 05/01/15 1626

## 2016-02-23 ENCOUNTER — Encounter (HOSPITAL_COMMUNITY): Payer: Self-pay | Admitting: Emergency Medicine

## 2016-02-23 ENCOUNTER — Emergency Department (HOSPITAL_COMMUNITY)
Admission: EM | Admit: 2016-02-23 | Discharge: 2016-02-23 | Disposition: A | Discharge status: Home / Self Care | Payer: PRIVATE HEALTH INSURANCE | Attending: Emergency Medicine | Admitting: Emergency Medicine

## 2016-02-23 DIAGNOSIS — J0100 Acute maxillary sinusitis, unspecified: Secondary | ICD-10-CM | POA: Insufficient documentation

## 2016-02-23 MED ORDER — METOCLOPRAMIDE HCL 10 MG PO TABS
10.0000 mg | ORAL_TABLET | Freq: Once | ORAL | Status: AC
  Administered 2016-02-23: 10 mg via ORAL
  Filled 2016-02-23: qty 1

## 2016-02-23 MED ORDER — CETIRIZINE-PSEUDOEPHEDRINE ER 5-120 MG PO TB12: 1.0000 | ORAL_TABLET | Freq: Two times a day (BID) | ORAL | 0 refills | Status: DC | PRN

## 2016-02-23 MED ORDER — TRAMADOL HCL 50 MG PO TABS: 50.0000 mg | ORAL_TABLET | Freq: Four times a day (QID) | ORAL | 0 refills | Status: DC | PRN

## 2016-02-23 MED ORDER — HYDROCODONE-ACETAMINOPHEN 5-325 MG PO TABS
2.0000 | ORAL_TABLET | Freq: Once | ORAL | Status: AC
  Administered 2016-02-23: 2 via ORAL
  Filled 2016-02-23: qty 2

## 2016-02-23 MED ORDER — AMOXICILLIN 500 MG PO CAPS: 500.0000 mg | ORAL_CAPSULE | Freq: Three times a day (TID) | ORAL | 0 refills | Status: DC

## 2016-02-23 NOTE — ED Notes (Signed)
Pt reports no active chest pain at present. PA Tatyana K informed of patient symptoms, reports no need for EKG/labs at this time.

## 2016-02-23 NOTE — ED Triage Notes (Signed)
Pt reports sinus infection for one week. Pt taking OTC meds with occasional relief. Pt reports new onset left sided headache behind left eye with pain down her left neck. Pt reports the pain occasionally goes into her left chest. Pain 8/10 to head, pain 6/10 to left chest. Pt also reports productive cough.

## 2016-02-23 NOTE — ED Provider Notes (Signed)
[facility]-EMERGENCY DEPT Provider Note   CSN: [id number] Arrival date & time: 02/23/16  1538   By signing my name below, I, Cynda AcresHailei Fulton, attest that this documentation has been prepared under the direction and in the presence of Cathren LaineKevin Nereyda Bowler, MD. Electronically Signed: Cynda AcresHailei Fulton, Scribe. 02/23/16. 4:31 PM.   History   Chief Complaint Chief Complaint  Patient presents with  . Headache  . Facial Pain   HPI Comments: Mandy Stewart is a 30 y.o. female who presents to the Emergency Department complaining of sudden onset of left-sided face pain that began earlier today. She has associated headache, congestion, and nausea. She describes the pain as throbbing, pulsating. She reports being sick with a fever, chills, cough, and congestion for a week and a half ago. Patient took 2 800mg  of ibuprofen prior to arrival with no improvement in facial pain. She denies any vomiting, no numbness/tingling/weakness of the arms or legs,    The history is provided by the patient. No language interpreter was used.    Past Medical History:  Diagnosis Date  . Medical history non-contributory     Patient Active Problem List   Diagnosis Date Noted  . Acute cholecystitis 12/21/2014  . Cholelithiasis 12/21/2014    Past Surgical History:  Procedure Laterality Date  . CHOLECYSTECTOMY N/A 12/22/2014   Procedure: LAPAROSCOPIC CHOLECYSTECTOMY;  Surgeon: Franky MachoMark Jenkins, MD;  Location: [facility];  Service: General;  Laterality: N/A;    OB History    Gravida Para Term Preterm AB Living   2       2 0   SAB TAB Ectopic Multiple Live Births   2               Home Medications    Prior to Admission medications   Medication Sig Start Date End Date Taking? Authorizing Provider  esomeprazole (NEXIUM) 40 MG capsule Take 40 mg by mouth daily.    Historical Provider, MD  ibuprofen (ADVIL,MOTRIN) 200 MG tablet Take 800 mg by mouth every 6 (six) hours as needed for moderate pain.     Historical Provider, MD    montelukast (SINGULAIR) 10 MG tablet Take 10 mg by mouth at bedtime as needed (for allergy relief).     Historical Provider, MD  naproxen sodium (ALEVE) 220 MG tablet Take 220 mg by mouth daily as needed (for pain).    Historical Provider, MD  ondansetron (ZOFRAN) 4 MG tablet Take 1 tablet (4 mg total) by mouth every 6 (six) hours as needed for nausea or vomiting. 12/22/14   Franky MachoMark Jenkins, MD  oxyCODONE-acetaminophen (PERCOCET) 7.5-325 MG tablet Take 1-2 tablets by mouth every 4 (four) hours as needed. 12/22/14   Franky MachoMark Jenkins, MD  traMADol (ULTRAM) 50 MG tablet Take 1 tablet (50 mg total) by mouth every 6 (six) hours as needed. 04/28/15   Burgess AmorJulie Idol, PA-C    Family History No family history on file.  Social History Social History  Substance Use Topics  . Smoking status: Never Smoker  . Smokeless tobacco: Not on file  . Alcohol use 0.6 oz/week    1 Cans of beer per week     Comment: occasionally     Allergies   Codeine   Review of Systems Review of Systems  HENT: Positive for congestion.   Gastrointestinal: Positive for nausea. Negative for vomiting.  All other systems reviewed and are negative.    Physical Exam Updated Vital Signs BP (!) 152/107 (BP Location: Left Arm)   Pulse 90  Temp 98.9 F (37.2 C) (Oral)   Resp 21   LMP 07/24/2015 Comment: peports irregular periods  SpO2 97%   Physical Exam  Constitutional: She is oriented to person, place, and time. She appears well-developed and well-nourished. No distress.  HENT:  Head: Normocephalic and atraumatic.  Left maxillary/frontal sinus tenderness.   Eyes: EOM are normal.  Neck: Normal range of motion.  No stiffness or rigidity  Cardiovascular: Normal rate.   Pulmonary/Chest: Effort normal.  Abdominal: She exhibits no distension. There is no tenderness.  Musculoskeletal: Normal range of motion.  Neurological: She is alert and oriented to person, place, and time.  Skin: Skin is warm and dry.  Psychiatric: She  has a normal mood and affect. Judgment normal.  Nursing note and vitals reviewed.    ED Treatments / Results  DIAGNOSTIC STUDIES: Oxygen Saturation is 97% on RA, normal by my interpretation.    COORDINATION OF CARE: 4:31 PM Discussed treatment plan with pt at bedside and pt agreed to plan.  Labs (all labs ordered are listed, but only abnormal results are displayed) Labs Reviewed - No data to display  EKG  EKG Interpretation None       Radiology No results found.  Procedures Procedures (including critical care time)  Medications Ordered in ED Medications - No data to display   Initial Impression / Assessment and Plan / ED Course  I have reviewed the triage vital signs and the nursing notes.  Pertinent labs & imaging results that were available during my care of the patient were reviewed by me and considered in my medical decision making (see chart for details).  Clinical Course     Symptoms and exam felt c/w sinusitis.  Will give abx rx and zyrtec d for congestion.  Reviewed nursing notes and prior charts for additional history.     Final Clinical Impressions(s) / ED Diagnoses   Final diagnoses:  None    New Prescriptions New Prescriptions   No medications on file   I personally performed the services described in this documentation, which was scribed in my presence. The recorded information has been reviewed and considered.     Cathren LaineKevin Tasheena Wambolt, MD 02/23/16 [phone]

## 2016-02-23 NOTE — Discharge Instructions (Signed)
It was our pleasure to provide your ER care today - we hope that you feel better.  Rest. Drink plenty of fluids.  Take antibiotic as prescribed.  Take zyrtec-d as need for congestion.  Take motrin or aleve as need for pain. You may also take ultram as need for pain -no driving when taking.   Follow up with primary care doctor in 1 week if symptoms fail to improve/resolve.  Return to ER if worse, new symptoms, worsening/severe pain, other concern.   You were given pain medication in the ER - no driving for the next 4 hours.

## 2016-04-18 IMAGING — DX DG CERVICAL SPINE COMPLETE 4+V
7 series · 7 of 7 positions shown · non-contrast
Comparison: None.

CLINICAL DATA: Patient was lifting boxes into attic on [REDACTED] and
felt a sharp pain go up left shoulder into neck followed by a warm
sensation. Best images due to patient moving

EXAM:
CERVICAL SPINE - COMPLETE 4+ VIEW

[c-spine lat]
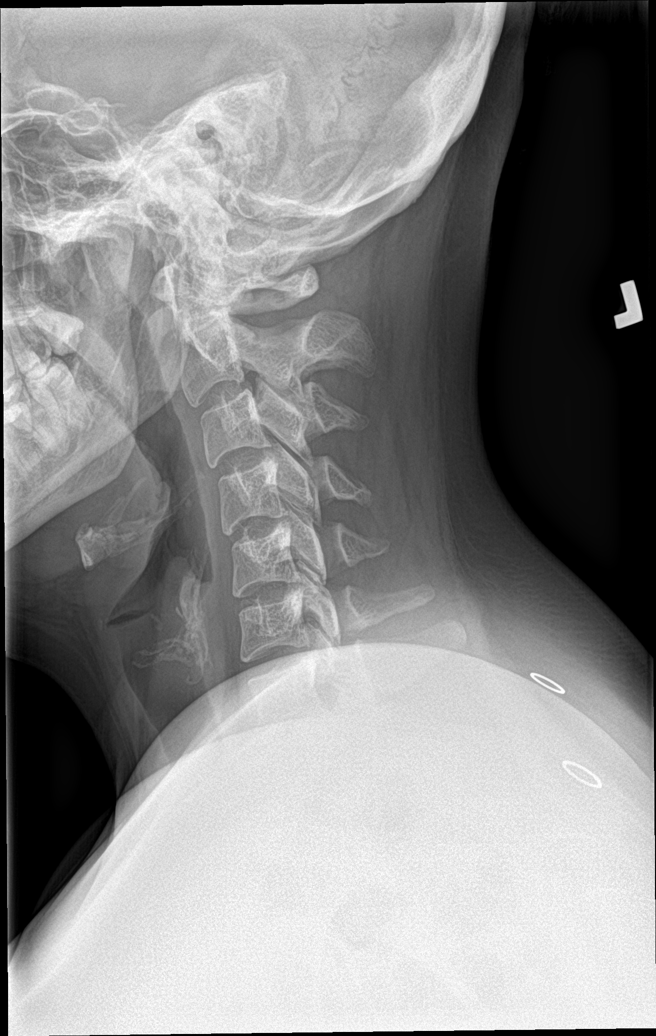

[c-spine obl (1 of 2)]
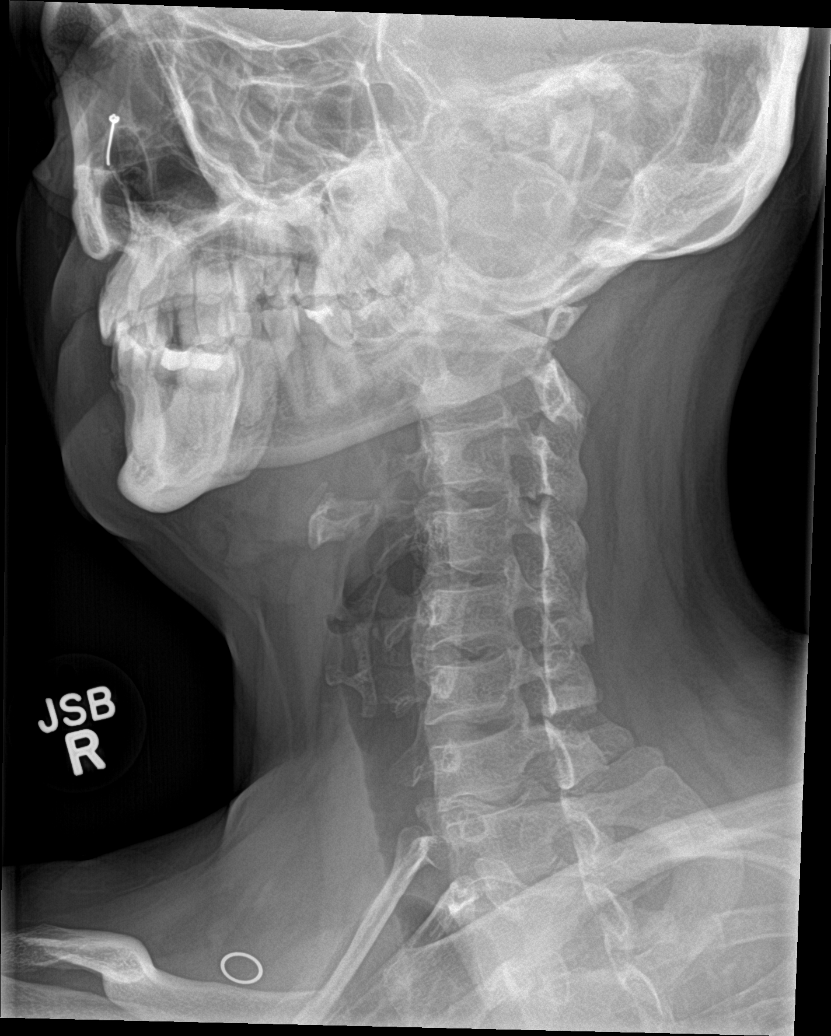

[c-spine ap]
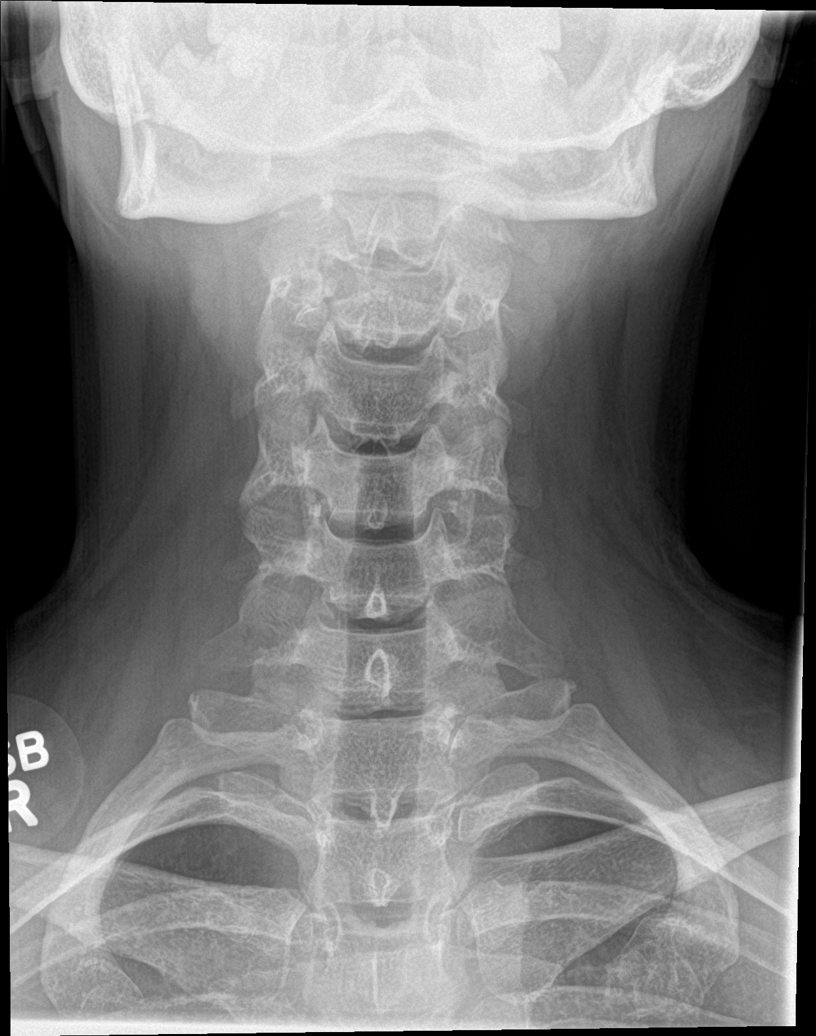

[c-spine open mouth]
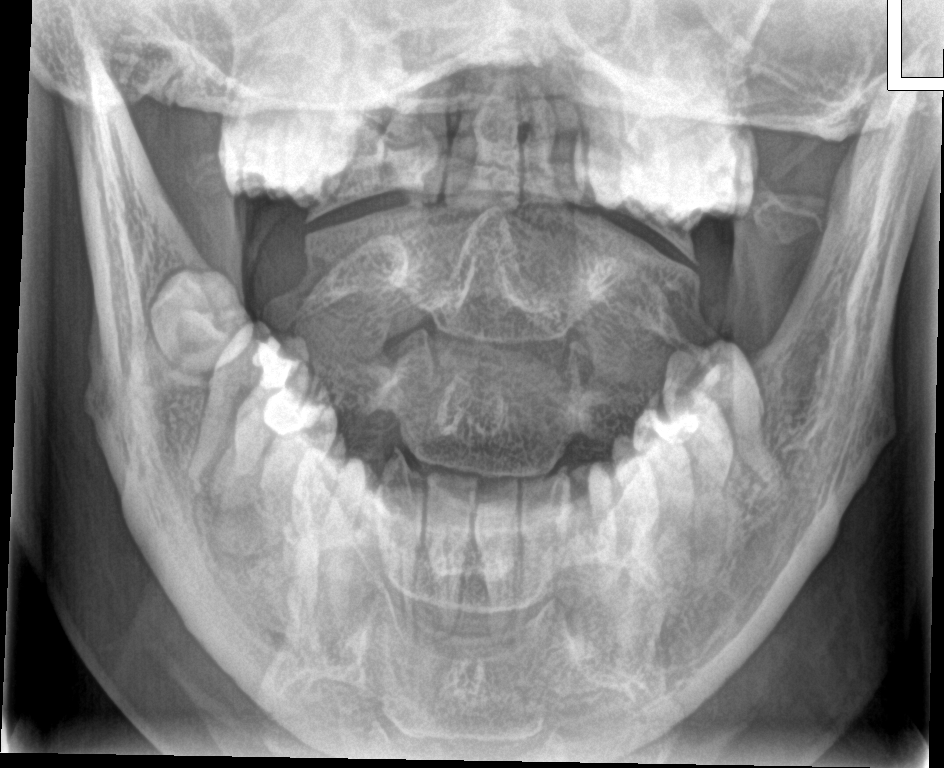

[c-spine swimmers]
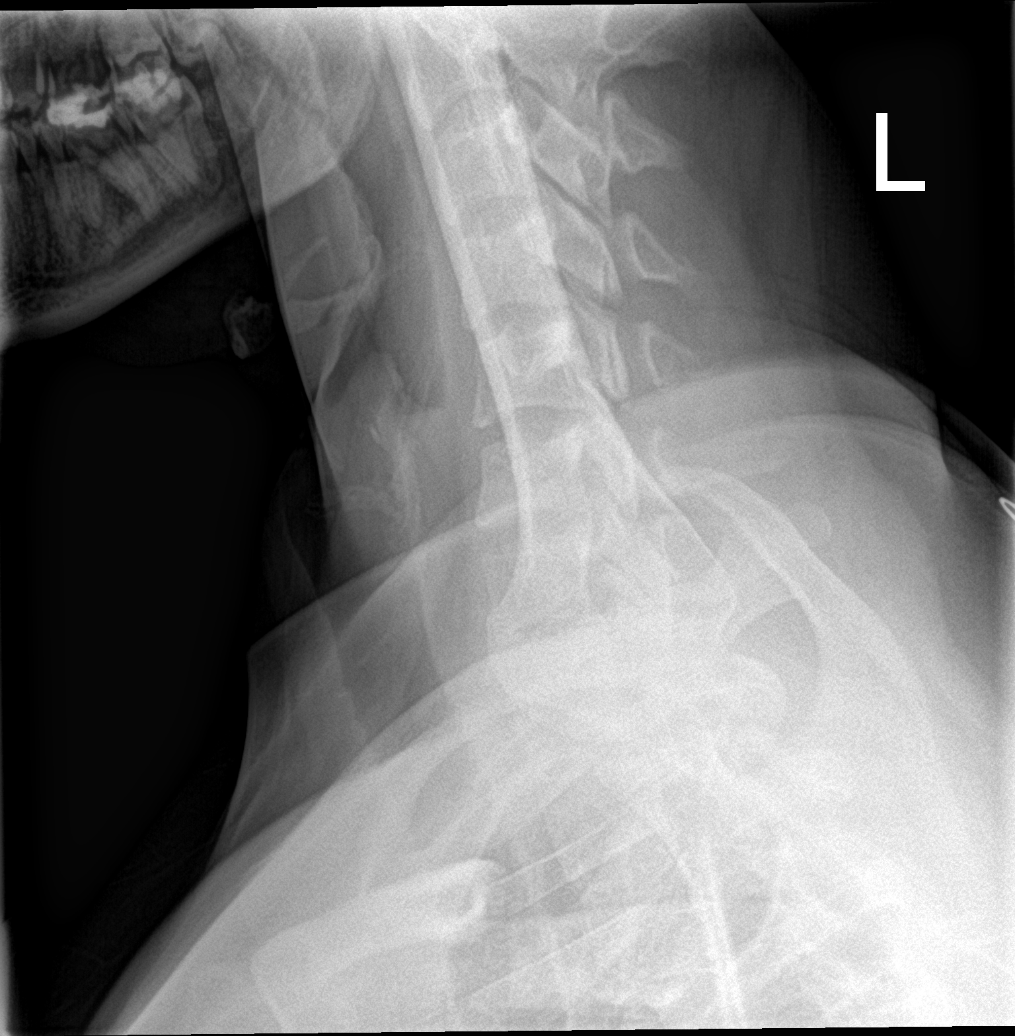

[[person_name]]
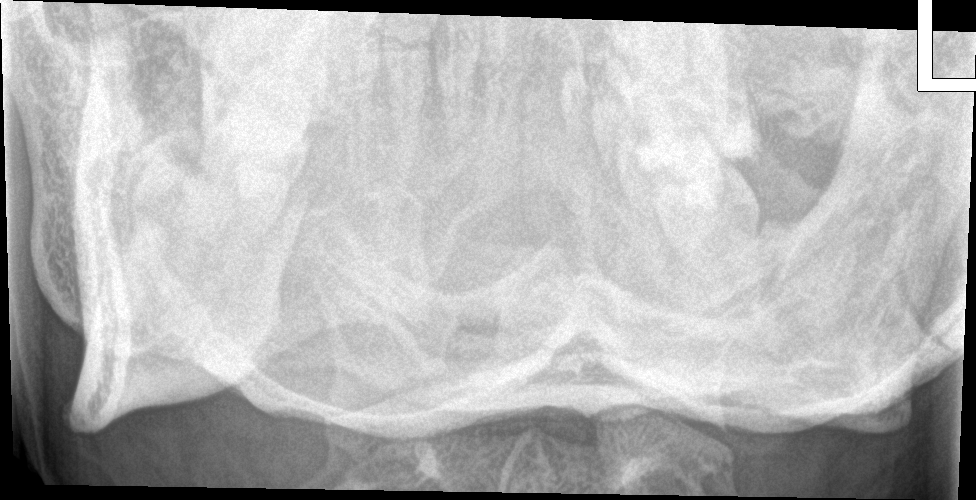

[c-spine obl (2 of 2)]
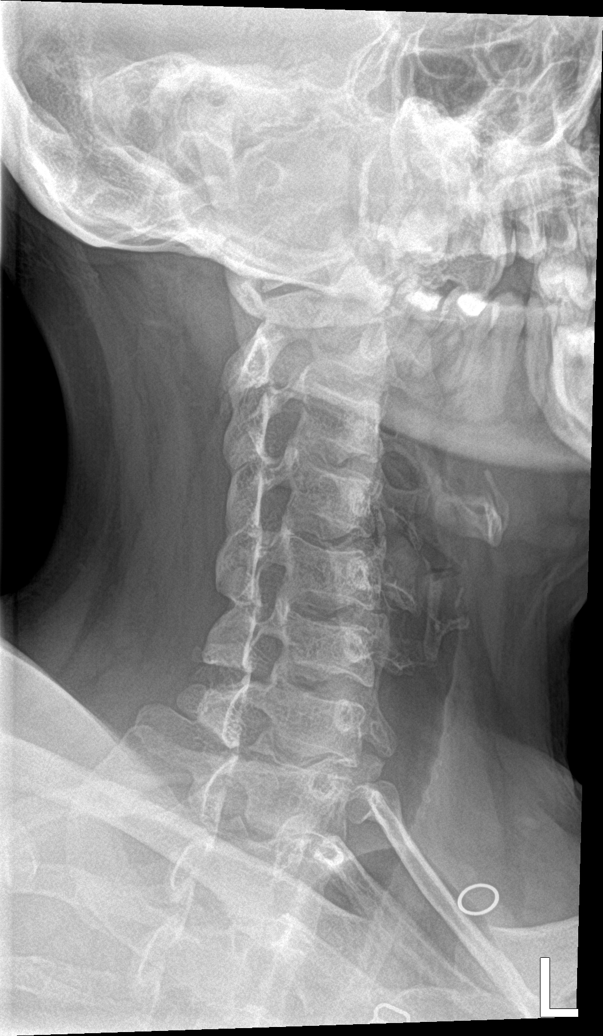

[7 of 7 positions shown; findings below may reference images not displayed]

FINDINGS: There is no evidence of cervical spine fracture or prevertebral soft
tissue swelling. Alignment is normal. No other significant bone
abnormalities are identified.
IMPRESSION: Negative cervical spine radiographs.

## 2017-03-23 ENCOUNTER — Encounter (HOSPITAL_COMMUNITY): Payer: Self-pay | Admitting: Emergency Medicine

## 2017-03-23 ENCOUNTER — Emergency Department (HOSPITAL_COMMUNITY)
Admission: EM | Admit: 2017-03-23 | Discharge: 2017-03-23 | Disposition: A | Discharge status: Home / Self Care | Payer: 59 | Attending: Emergency Medicine | Admitting: Emergency Medicine

## 2017-03-23 ENCOUNTER — Other Ambulatory Visit: Payer: Self-pay

## 2017-03-23 ENCOUNTER — Emergency Department (HOSPITAL_COMMUNITY): Payer: 59

## 2017-03-23 DIAGNOSIS — M791 Myalgia, unspecified site: Secondary | ICD-10-CM | POA: Insufficient documentation

## 2017-03-23 DIAGNOSIS — R0789 Other chest pain: Secondary | ICD-10-CM | POA: Diagnosis present

## 2017-03-23 DIAGNOSIS — Z79899 Other long term (current) drug therapy: Secondary | ICD-10-CM | POA: Insufficient documentation

## 2017-03-23 DIAGNOSIS — Y92410 Unspecified street and highway as the place of occurrence of the external cause: Secondary | ICD-10-CM | POA: Insufficient documentation

## 2017-03-23 DIAGNOSIS — Y9389 Activity, other specified: Secondary | ICD-10-CM | POA: Insufficient documentation

## 2017-03-23 DIAGNOSIS — Y999 Unspecified external cause status: Secondary | ICD-10-CM | POA: Diagnosis not present

## 2017-03-23 MED ORDER — CYCLOBENZAPRINE HCL 10 MG PO TABS: 10.0000 mg | ORAL_TABLET | Freq: Three times a day (TID) | ORAL | 0 refills | Status: DC | PRN

## 2017-03-23 MED ORDER — IBUPROFEN 800 MG PO TABS: 800.0000 mg | ORAL_TABLET | Freq: Three times a day (TID) | ORAL | 0 refills | Status: DC

## 2017-03-23 NOTE — Discharge Instructions (Signed)
Apply ice packs on and off as needed.  Follow-up with your primary doctor or return here for any worsening symptoms
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
## 2017-04-20 ENCOUNTER — Emergency Department (HOSPITAL_COMMUNITY)
Admission: EM | Admit: 2017-04-20 | Discharge: 2017-04-20 | Disposition: A | Discharge status: Home / Self Care | Payer: 59 | Attending: Emergency Medicine | Admitting: Emergency Medicine

## 2017-04-20 ENCOUNTER — Encounter (HOSPITAL_COMMUNITY): Payer: Self-pay | Admitting: Emergency Medicine

## 2017-04-20 ENCOUNTER — Emergency Department (HOSPITAL_COMMUNITY): Payer: 59

## 2017-04-20 DIAGNOSIS — J111 Influenza due to unidentified influenza virus with other respiratory manifestations: Secondary | ICD-10-CM | POA: Diagnosis not present

## 2017-04-20 DIAGNOSIS — Z79899 Other long term (current) drug therapy: Secondary | ICD-10-CM | POA: Insufficient documentation

## 2017-04-20 DIAGNOSIS — R69 Illness, unspecified: Secondary | ICD-10-CM

## 2017-04-20 DIAGNOSIS — R05 Cough: Secondary | ICD-10-CM | POA: Diagnosis present

## 2017-04-20 LAB — POC URINE PREG, ED: Preg Test, Ur: NEGATIVE

## 2017-04-20 MED ORDER — LORATADINE-PSEUDOEPHEDRINE ER 5-120 MG PO TB12: 1.0000 | ORAL_TABLET | Freq: Two times a day (BID) | ORAL | 0 refills | Status: DC

## 2017-04-20 MED ORDER — ACETAMINOPHEN 325 MG PO TABS
650.0000 mg | ORAL_TABLET | Freq: Once | ORAL | Status: AC
  Administered 2017-04-20: 650 mg via ORAL
  Filled 2017-04-20: qty 2

## 2017-04-20 MED ORDER — HYDROCODONE-HOMATROPINE 5-1.5 MG/5ML PO SYRP: 5.0000 mL | ORAL_SOLUTION | Freq: Four times a day (QID) | ORAL | 0 refills | Status: DC | PRN

## 2017-04-20 NOTE — ED Provider Notes (Signed)
[facility] EMERGENCY DEPARTMENT Provider Note   CSN: [id number] Arrival date & time: 04/20/17  0803     History   Chief Complaint Chief Complaint  Patient presents with  . Cough    HPI Mandy Stewart is a 32 y.o. female.  Patient is a 32 year old female who presents to the emergency department with a complaint of cough, sore throat, and congestion.  Patient gives a 2-day history of congestion, chills, fever, sore throat, and cough.  She complains of aching, and generally not feeling well.  There is been no vomiting reported.  There was an episode of diarrhea on yesterday.  The chills and aching are getting worse, interfering with activities of daily living.  The patient states she is getting minimal response from over-the-counter medications and presents to the emergency department for a assistance with this issue.      Past Medical History:  Diagnosis Date  . Medical history non-contributory     Patient Active Problem List   Diagnosis Date Noted  . Acute cholecystitis 12/21/2014  . Cholelithiasis 12/21/2014    Past Surgical History:  Procedure Laterality Date  . CHOLECYSTECTOMY N/A 12/22/2014   Procedure: LAPAROSCOPIC CHOLECYSTECTOMY;  Surgeon: Franky MachoMark Jenkins, MD;  Location: [facility];  Service: General;  Laterality: N/A;    OB History    Gravida Para Term Preterm AB Living   2       2 0   SAB TAB Ectopic Multiple Live Births   2               Home Medications    Prior to Admission medications   Medication Sig Start Date End Date Taking? Authorizing Provider  amoxicillin (AMOXIL) 500 MG capsule Take 1 capsule (500 mg total) by mouth 3 (three) times daily. 02/23/16   Cathren LaineSteinl, Kevin, MD  cetirizine-pseudoephedrine (ZYRTEC-D) 5-120 MG tablet Take 1 tablet by mouth 2 (two) times daily as needed for allergies. 02/23/16   Cathren LaineSteinl, Kevin, MD  cyclobenzaprine (FLEXERIL) 10 MG tablet Take 1 tablet (10 mg total) by mouth 3 (three) times daily as needed. 03/23/17   Triplett,  Tammy, PA-C  esomeprazole (NEXIUM) 40 MG capsule Take 40 mg by mouth daily.    [provider]  ibuprofen (ADVIL,MOTRIN) 800 MG tablet Take 1 tablet (800 mg total) by mouth 3 (three) times daily. 03/23/17   Triplett, Tammy, PA-C  montelukast (SINGULAIR) 10 MG tablet Take 10 mg by mouth at bedtime as needed (for allergy relief).     [provider]  ondansetron (ZOFRAN) 4 MG tablet Take 1 tablet (4 mg total) by mouth every 6 (six) hours as needed for nausea or vomiting. 12/22/14   Franky MachoJenkins, Mark, MD  oxyCODONE-acetaminophen (PERCOCET) 7.5-325 MG tablet Take 1-2 tablets by mouth every 4 (four) hours as needed. 12/22/14   Franky MachoJenkins, Mark, MD  traMADol (ULTRAM) 50 MG tablet Take 1 tablet (50 mg total) by mouth every 6 (six) hours as needed. 04/28/15   Burgess AmorIdol, Julie, PA-C  traMADol (ULTRAM) 50 MG tablet Take 1 tablet (50 mg total) by mouth every 6 (six) hours as needed. 02/23/16   Cathren LaineSteinl, Kevin, MD    Family History History reviewed. No pertinent family history.  Social History Social History   Tobacco Use  . Smoking status: Never Smoker  . Smokeless tobacco: Never Used  Substance Use Topics  . Alcohol use: Yes    Alcohol/week: 0.6 oz    Types: 1 Cans of beer per week    Comment: occasionally  .  Drug use: No     Allergies   Codeine   Review of Systems Review of Systems  Constitutional: Positive for appetite change, chills and fever. Negative for activity change.       All ROS Neg except as noted in HPI  HENT: Positive for congestion, postnasal drip and sore throat. Negative for nosebleeds.   Eyes: Negative for photophobia and discharge.  Respiratory: Positive for choking. Negative for cough, shortness of breath and wheezing.   Cardiovascular: Negative for chest pain and palpitations.  Gastrointestinal: Negative for abdominal pain and blood in stool.  Genitourinary: Negative for dysuria, frequency and hematuria.  Musculoskeletal: Positive for myalgias. Negative for  arthralgias, back pain and neck pain.  Skin: Negative.   Neurological: Negative for dizziness, seizures and speech difficulty.  Psychiatric/Behavioral: Negative for confusion and hallucinations.     Physical Exam Updated Vital Signs BP (!) 152/96 (BP Location: Right Arm)   Pulse (!) 111   Temp (!) 100.5 F (38.1 C) (Oral)   Resp 20   Ht 5\' 1"  (1.549 m)   Wt 85.3 kg (188 lb)   LMP 04/20/2016   SpO2 96%   BMI 35.52 kg/m   Physical Exam  Constitutional: She is oriented to person, place, and time. She appears well-developed and well-nourished.  Non-toxic appearance.  HENT:  Head: Normocephalic.  Right Ear: Tympanic membrane and external ear normal.  Left Ear: Tympanic membrane and external ear normal.  Nasal congestion present.  No mastoid involvement appreciated.  Eyes: EOM and lids are normal. Pupils are equal, round, and reactive to light.  Neck: Normal range of motion. Neck supple. Carotid bruit is not present.  Cardiovascular: Normal rate, regular rhythm, intact distal pulses and normal pulses.  Few scattered rhonchi present.  Pulmonary/Chest: Breath sounds normal. No respiratory distress.  There is symmetrical rise and fall of the chest.  Patient speaks in complete sentences without problem.  Abdominal: Soft. Bowel sounds are normal. There is no tenderness. There is no guarding.  Musculoskeletal: Normal range of motion.  Lymphadenopathy:       Head (right side): No submandibular adenopathy present.       Head (left side): No submandibular adenopathy present.    She has no cervical adenopathy.  Neurological: She is alert and oriented to person, place, and time. She has normal strength. No cranial nerve deficit or sensory deficit.  Skin: Skin is warm and dry.  Psychiatric: She has a normal mood and affect. Her speech is normal.  Nursing note and vitals reviewed.    ED Treatments / Results  Labs (all labs ordered are listed, but only abnormal results are  displayed) Labs Reviewed  POC URINE PREG, ED    EKG  EKG Interpretation None       Radiology Dg Chest 2 View  Result Date: 04/20/2017 CLINICAL DATA:  Cough and congestion with fever EXAM: CHEST  2 VIEW COMPARISON:  March 23, 2017 FINDINGS: The lungs are clear. The heart size and pulmonary vascularity are normal. No adenopathy. No bone lesions. IMPRESSION: No edema or consolidation. Electronically Signed   By: Bretta Bang III M.D.   On: 04/20/2017 08:49    Procedures Procedures (including critical care time)  Medications Ordered in ED Medications  acetaminophen (TYLENOL) tablet 650 mg (650 mg Oral Given 04/20/17 0835)     Initial Impression / Assessment and Plan / ED Course  I have reviewed the triage vital signs and the nursing notes.  Pertinent labs & imaging results that were  available during my care of the patient were reviewed by me and considered in my medical decision making (see chart for details).       Final Clinical Impressions(s) / ED Diagnoses MDM  Vital signs reviewed.  Pulse oximetry is 96% on room air.  Chest x-ray shows no edema or consolidation.  Urine pregnancy test is negative.  The examination and the history suggest influenza-like illness.  The patient is asked to have the entire family wash hands frequently.  Patient also asked to use Claritin-D every 12 hours, also to use Hycodan for cough and congestion.  Patient is to use Tylenol every 4 hours or ibuprofen every 6 hours for aching and or fever.  Patient will follow up with her primary physician, or return to the emergency department if any changes, problems, or concerns.   Final diagnoses:  Influenza-like illness    ED Discharge Orders        Ordered    loratadine-pseudoephedrine (CLARITIN-D 12 HOUR) 5-120 MG tablet  2 times daily     04/20/17 0933    HYDROcodone-homatropine (HYCODAN) 5-1.5 MG/5ML syrup  Every 6 hours PRN     04/20/17 0935       Ivery Quale, PA-C 04/20/17  1712    Loren Racer, MD 04/25/17 [phone]

## 2017-04-20 NOTE — ED Triage Notes (Addendum)
Patient complaining of cough, sore throat, congestion, and chills x 2 days. Also complaining of diarrhea since yesterday.

## 2017-04-20 NOTE — Discharge Instructions (Signed)
Your temperature today was 100.5.  Please use Tylenol every 4 hours, or ibuprofen every 6 hours for the next 2 days, then on an as-needed basis.  Please increase water, Gatorade, Kool-Aid, juices, etc.  Please have everyone at home wash hands frequently.  Please use your mask until symptoms have resolved.  Wipe down surfaces to prevent spread of this infection.  Please use Claritin-D every 12 hours for congestion and to assist with cough.  Please use Hycodan every 6 hours for cough.  Hycodan may cause drowsiness, and/or lightheadedness.  Please do not drink alcohol, drive a vehicle, operate machinery, or participate in activities requiring concentration when taking this medication.  Please see Ms. Jean RosenthalJackson for follow-up or recheck if not improving.

## 2017-06-25 ENCOUNTER — Other Ambulatory Visit: Payer: Self-pay

## 2017-06-25 ENCOUNTER — Emergency Department (HOSPITAL_COMMUNITY)
Admission: EM | Admit: 2017-06-25 | Discharge: 2017-06-25 | Disposition: A | Discharge status: Home / Self Care | Payer: 59 | Attending: Emergency Medicine | Admitting: Emergency Medicine

## 2017-06-25 ENCOUNTER — Encounter (HOSPITAL_COMMUNITY): Payer: Self-pay | Admitting: Emergency Medicine

## 2017-06-25 DIAGNOSIS — Z79899 Other long term (current) drug therapy: Secondary | ICD-10-CM | POA: Diagnosis not present

## 2017-06-25 DIAGNOSIS — K047 Periapical abscess without sinus: Secondary | ICD-10-CM | POA: Diagnosis not present

## 2017-06-25 DIAGNOSIS — K0889 Other specified disorders of teeth and supporting structures: Secondary | ICD-10-CM

## 2017-06-25 LAB — BASIC METABOLIC PANEL
Anion gap: 12 (ref 5–15)
BUN: 11 mg/dL (ref 6–20)
CHLORIDE: 104 mmol/L (ref 101–111)
CO2: 21 mmol/L — AB (ref 22–32)
CREATININE: 0.72 mg/dL (ref 0.44–1.00)
Calcium: 9.2 mg/dL (ref 8.9–10.3)
GFR calc non Af Amer: >60 mL/min (ref >60)
Glucose, Bld: 107 mg/dL — ABNORMAL HIGH (ref 65–99)
POTASSIUM: 3.4 mmol/L — AB (ref 3.5–5.1)
Sodium: 137 mmol/L (ref 135–145)

## 2017-06-25 LAB — CBC WITH DIFFERENTIAL/PLATELET
BASOS PCT: 0 %
Basophils Absolute: 0 10*3/uL (ref 0.0–0.1)
Eosinophils Absolute: 0 10*3/uL (ref 0.0–0.7)
Eosinophils Relative: 0 %
HEMATOCRIT: 40.1 % (ref 36.0–46.0)
HEMOGLOBIN: 13.2 g/dL (ref 12.0–15.0)
LYMPHS ABS: 1.6 10*3/uL (ref 0.7–4.0)
Lymphocytes Relative: 14 %
MCH: 29.1 pg (ref 26.0–34.0)
MCHC: 32.9 g/dL (ref 30.0–36.0)
MCV: 88.5 fL (ref 78.0–100.0)
MONOS PCT: 6 %
Monocytes Absolute: 0.7 10*3/uL (ref 0.1–1.0)
NEUTROS ABS: 8.8 10*3/uL — AB (ref 1.7–7.7)
NEUTROS PCT: 80 %
Platelets: 210 10*3/uL (ref 150–400)
RBC: 4.53 MIL/uL (ref 3.87–5.11)
RDW: 12.9 % (ref 11.5–15.5)
WBC: 11.1 10*3/uL — ABNORMAL HIGH (ref 4.0–10.5)

## 2017-06-25 MED ORDER — DIPHENHYDRAMINE HCL 50 MG/ML IJ SOLN
12.5000 mg | Freq: Once | INTRAMUSCULAR | Status: AC
  Administered 2017-06-25: 12.5 mg via INTRAVENOUS
  Filled 2017-06-25: qty 1

## 2017-06-25 MED ORDER — ACETAMINOPHEN 325 MG PO TABS: 650.0000 mg | ORAL_TABLET | Freq: Four times a day (QID) | ORAL | 0 refills | Status: DC | PRN

## 2017-06-25 MED ORDER — AMOXICILLIN-POT CLAVULANATE 875-125 MG PO TABS: 1.0000 | ORAL_TABLET | Freq: Two times a day (BID) | ORAL | 0 refills | Status: AC

## 2017-06-25 MED ORDER — METOCLOPRAMIDE HCL 5 MG/ML IJ SOLN
10.0000 mg | Freq: Once | INTRAMUSCULAR | Status: AC
  Administered 2017-06-25: 10 mg via INTRAVENOUS
  Filled 2017-06-25: qty 2

## 2017-06-25 MED ORDER — AMOXICILLIN-POT CLAVULANATE 875-125 MG PO TABS
1.0000 | ORAL_TABLET | Freq: Once | ORAL | Status: AC
  Administered 2017-06-25: 1 via ORAL
  Filled 2017-06-25: qty 1

## 2017-06-25 MED ORDER — IBUPROFEN 800 MG PO TABS: 800.0000 mg | ORAL_TABLET | Freq: Three times a day (TID) | ORAL | 0 refills | Status: DC

## 2017-06-25 MED ORDER — MAGIC MOUTHWASH W/LIDOCAINE: 5.0000 mL | Freq: Once | ORAL | Status: DC

## 2017-06-25 MED ORDER — ONDANSETRON 4 MG PO TBDP: 4.0000 mg | ORAL_TABLET | Freq: Three times a day (TID) | ORAL | 0 refills | Status: DC | PRN

## 2017-06-25 MED ORDER — MAGIC MOUTHWASH
5.0000 mL | Freq: Once | ORAL | Status: AC
  Administered 2017-06-25: 5 mL via ORAL
  Filled 2017-06-25: qty 5

## 2017-06-25 MED ORDER — SODIUM CHLORIDE 0.9 % IV BOLUS
500.0000 mL | Freq: Once | INTRAVENOUS | Status: AC
  Administered 2017-06-25: 500 mL via INTRAVENOUS

## 2017-06-25 MED ORDER — LIDOCAINE VISCOUS 2 % MT SOLN
5.0000 mL | Freq: Once | OROMUCOSAL | Status: AC
  Administered 2017-06-25: 5 mL via OROMUCOSAL
  Filled 2017-06-25: qty 15

## 2017-06-25 MED ORDER — ACETAMINOPHEN 500 MG PO TABS
1000.0000 mg | ORAL_TABLET | Freq: Once | ORAL | Status: AC
  Administered 2017-06-25: 1000 mg via ORAL
  Filled 2017-06-25: qty 2

## 2017-06-25 NOTE — ED Triage Notes (Signed)
Patient c/o left upper pain dental pain x1 week. Patient states woke this morning with chills, body aches, dizziness, headache, and nausea. Patient states she has been taking a friends antibiotic but unsure of the name. Patient tylenol and ibuprofen, last took ibuprofen at 2:30pm today.

## 2017-06-25 NOTE — ED Notes (Signed)
Pt reports bad teeth for awhile with one tooth bothering her more for the pas t week  She took one of her mother's motrin 800 mg at 1400  Here for fever, HA, tooth pain

## 2017-06-25 NOTE — ED Notes (Signed)
JW in to assess

## 2017-06-25 NOTE — Discharge Instructions (Addendum)
As discussed, make sure that you follow-up with a dentist as soon as possible.  You were provided with a list of dentists in the area.  Take your entire course of antibiotics even if you feel better.  Alternate between ibuprofen and Tylenol on schedule for pain and fever.  Drink enough fluids to keep your urine clear and maintain hydration.  Zofran only as needed for nausea/vomiting.  Follow-up with your primary care provider.  Return if symptoms worsen or new concerning symptoms in the meantime.

## 2017-06-25 NOTE — ED Provider Notes (Signed)
[facility] EMERGENCY DEPARTMENT Provider Note   CSN: [id number] Arrival date & time: 06/25/17  1707     History   Chief Complaint Chief Complaint  Patient presents with  . Dental Pain    HPI Mandy Stewart is a 32 y.o. female presenting with 1 week of left upper molar dental pain which was bearable until last night when she started to experience fever, chills, as well as feeling fatigued and nauseous.  She also reports congestion. She denies any vomiting, abdominal pain, diarrhea.  She does reports a mild throbbing headache but  has headaches a lot at baseline nothing new.  No visual disturbances.  She denies any known ill contacts.  No neck pain or stiffness.  She has tried Tylenol and ibuprofen without relief.  Last ibuprofen at 2:30 PM.  HPI  Past Medical History:  Diagnosis Date  . Medical history non-contributory     Patient Active Problem List   Diagnosis Date Noted  . Acute cholecystitis 12/21/2014  . Cholelithiasis 12/21/2014    Past Surgical History:  Procedure Laterality Date  . CHOLECYSTECTOMY N/A 12/22/2014   Procedure: LAPAROSCOPIC CHOLECYSTECTOMY;  Surgeon: Franky Macho, MD;  Location: [facility];  Service: General;  Laterality: N/A;     OB History    Gravida  2   Para      Term      Preterm      AB  2   Living  0     SAB  2   TAB      Ectopic      Multiple      Live Births               Home Medications    Prior to Admission medications   Medication Sig Start Date End Date Taking? Authorizing Provider  acetaminophen (TYLENOL) 325 MG tablet Take 2 tablets (650 mg total) by mouth every 6 (six) hours as needed for mild pain or moderate pain. 06/25/17   Mathews Robinsons B, PA-C  amoxicillin (AMOXIL) 500 MG capsule Take 1 capsule (500 mg total) by mouth 3 (three) times daily. 02/23/16   Cathren Laine, MD  amoxicillin-clavulanate (AUGMENTIN) 875-125 MG tablet Take 1 tablet by mouth 2 (two) times daily for 7 days. 06/25/17 07/02/17  Mathews Robinsons B, PA-C  cetirizine-pseudoephedrine (ZYRTEC-D) 5-120 MG tablet Take 1 tablet by mouth 2 (two) times daily as needed for allergies. 02/23/16   Cathren Laine, MD  cyclobenzaprine (FLEXERIL) 10 MG tablet Take 1 tablet (10 mg total) by mouth 3 (three) times daily as needed. 03/23/17   Triplett, Tammy, PA-C  esomeprazole (NEXIUM) 40 MG capsule Take 40 mg by mouth daily.    [provider]  HYDROcodone-homatropine (HYCODAN) 5-1.5 MG/5ML syrup Take 5 mLs by mouth every 6 (six) hours as needed. 04/20/17   Ivery Quale, PA-C  ibuprofen (ADVIL,MOTRIN) 800 MG tablet Take 1 tablet (800 mg total) by mouth 3 (three) times daily. 06/25/17   Georgiana Shore, PA-C  loratadine-pseudoephedrine (CLARITIN-D 12 HOUR) 5-120 MG tablet Take 1 tablet by mouth 2 (two) times daily. 04/20/17   Ivery Quale, PA-C  montelukast (SINGULAIR) 10 MG tablet Take 10 mg by mouth at bedtime as needed (for allergy relief).     [provider]  ondansetron (ZOFRAN ODT) 4 MG disintegrating tablet Take 1 tablet (4 mg total) by mouth every 8 (eight) hours as needed for nausea or vomiting. 06/25/17   Mathews Robinsons B, PA-C  ondansetron (ZOFRAN) 4 MG  tablet Take 1 tablet (4 mg total) by mouth every 6 (six) hours as needed for nausea or vomiting. 12/22/14   Franky Macho, MD  oxyCODONE-acetaminophen (PERCOCET) 7.5-325 MG tablet Take 1-2 tablets by mouth every 4 (four) hours as needed. 12/22/14   Franky Macho, MD  traMADol (ULTRAM) 50 MG tablet Take 1 tablet (50 mg total) by mouth every 6 (six) hours as needed. 04/28/15   Burgess Amor, PA-C  traMADol (ULTRAM) 50 MG tablet Take 1 tablet (50 mg total) by mouth every 6 (six) hours as needed. 02/23/16   Cathren Laine, MD    Family History No family history on file.  Social History Social History   Tobacco Use  . Smoking status: Never Smoker  . Smokeless tobacco: Never Used  Substance Use Topics  . Alcohol use: Yes    Alcohol/week: 0.6 oz    Types: 1 Cans of  beer per week    Comment: occasionally  . Drug use: No     Allergies   Codeine   Review of Systems Review of Systems  Constitutional: Positive for chills, fatigue and fever.  HENT: Positive for congestion and dental problem. Negative for ear pain, facial swelling, sore throat, trouble swallowing and voice change.   Eyes: Negative for photophobia, redness and visual disturbance.  Respiratory: Negative for cough, choking, chest tightness, shortness of breath, wheezing and stridor.   Cardiovascular: Negative for chest pain, palpitations and leg swelling.  Gastrointestinal: Negative for abdominal distention, abdominal pain, diarrhea, nausea and vomiting.  Genitourinary: Negative for difficulty urinating, dysuria, flank pain and hematuria.  Musculoskeletal: Positive for myalgias. Negative for arthralgias, gait problem, neck pain and neck stiffness.  Skin: Negative for color change, pallor and rash.  Neurological: Positive for light-headedness and headaches. Negative for syncope and weakness.  Psychiatric/Behavioral: Negative for behavioral problems.     Physical Exam Updated Vital Signs BP 136/88 (BP Location: Right Arm)   Pulse (!) 108   Temp 99.1 F (37.3 C) (Temporal)   Resp 16   Ht  (1.549 m)   Wt 90.7 kg (200 lb)   SpO2 94%   BMI 37.79 kg/m   Physical Exam  Constitutional: She is oriented to person, place, and time. She appears well-developed and well-nourished. No distress.  Patient is febrile on arrival at 101.6, tachycardic, she is nontoxic-appearing lying comfortably in bed no acute distress.  HENT:  Head: Normocephalic and atraumatic.  Mouth/Throat: Uvula is midline, oropharynx is clear and moist and mucous membranes are normal. Mucous membranes are not pale and not dry. Oral lesions present. No trismus in the jaw. Abnormal dentition. Dental caries present. No dental abscesses or uvula swelling. No oropharyngeal exudate, posterior oropharyngeal erythema or  tonsillar abscesses. No tonsillar exudate.    Eyes: Pupils are equal, round, and reactive to light. Conjunctivae and EOM are normal. Right eye exhibits no discharge. Left eye exhibits no discharge.  Neck: Normal range of motion. Neck supple.  Negative Brudzinski and Kernig's  Cardiovascular: Normal rate, regular rhythm, normal heart sounds and intact distal pulses.  No murmur heard. Pulmonary/Chest: Effort normal and breath sounds normal. No stridor. No respiratory distress. She has no wheezes. She has no rales.  Abdominal: Soft. She exhibits no distension and no mass. There is no tenderness. There is no rebound and no guarding.  Musculoskeletal: Normal range of motion. She exhibits no edema, tenderness or deformity.  Lymphadenopathy:    She has cervical adenopathy.  Neurological: She is alert and oriented to person, place, and  time. No cranial nerve deficit or sensory deficit. She exhibits normal muscle tone. Coordination normal.  Neurologic Exam:  - Mental status: Patient is alert and cooperative. Fluent speech and words are clear. Coherent thought processes and insight is good. Patient is oriented x 4 to person, place, time and event.  - Cranial nerves:  CN III, IV, VI: pupils equally round, reactive to light both direct and conscensual. Full extra-ocular movement. CN V: motor temporalis and masseter strength intact. CN VII : muscles of facial expression intact. CN X :  midline uvula. XI strength of sternocleidomastoid and trapezius muscles 5/5, XII: tongue is midline when protruded. - Motor: No involuntary movements. Muscle tone and bulk normal throughout. Muscle strength is 5/5 in bilateral shoulder abduction, elbow flexion and extension, grip, hip extension, flexion, leg flexion and extension, ankle dorsiflexion and plantar flexion.  - Sensory: Proprioception, light tough sensation intact in all extremities.  - Cerebellar: rapid alternating movements and point to point movement intact in  upper and lower extremities. Normal stance and gait.  Skin: Skin is warm and dry. No rash noted. She is not diaphoretic. No erythema. No pallor.  Psychiatric: She has a normal mood and affect.  Nursing note and vitals reviewed.    ED Treatments / Results  Labs (all labs ordered are listed, but only abnormal results are displayed) Labs Reviewed  CBC WITH DIFFERENTIAL/PLATELET - Abnormal; Notable for the following components:      Result Value   WBC 11.1 (*)    Neutro Abs 8.8 (*)    All other components within normal limits  BASIC METABOLIC PANEL - Abnormal; Notable for the following components:   Potassium 3.4 (*)    CO2 21 (*)    Glucose, Bld 107 (*)    All other components within normal limits    EKG None  Radiology No results found.  Procedures Procedures (including critical care time)  Medications Ordered in ED Medications  acetaminophen (TYLENOL) tablet 1,000 mg (1,000 mg Oral Given 06/25/17 1922)  amoxicillin-clavulanate (AUGMENTIN) 875-125 MG per tablet 1 tablet (1 tablet Oral Given 06/25/17 1922)  sodium chloride 0.9 % bolus 500 mL (0 mLs Intravenous Stopped 06/25/17 2032)  magic mouthwash (5 mLs Oral Given 06/25/17 1953)    And  lidocaine (XYLOCAINE) 2 % viscous mouth solution 5 mL (5 mLs Mouth/Throat Given 06/25/17 1924)  metoCLOPramide (REGLAN) injection 10 mg (10 mg Intravenous Given 06/25/17 1954)  diphenhydrAMINE (BENADRYL) injection 12.5 mg (12.5 mg Intravenous Given 06/25/17 1941)     Initial Impression / Assessment and Plan / ED Course  I have reviewed the triage vital signs and the nursing notes.  Pertinent labs & imaging results that were available during my care of the patient were reviewed by me and considered in my medical decision making (see chart for details).    Patient presenting with 1 week of left upper molar pain with sudden onset fever, chills, congestion, nausea starting this morning.  Febrile and tachycardic on arrival, otherwise  well-appearing nontoxic. Patient complains of lightheadedness and headache. She has history of chronic headaches and reports nothing new about this headache. Reassuring exam, normal neuro.  No meningeal signs.  Will give headache cocktail and reassess.  Patient with toothache.  No gross abscess.  Exam unconcerning for Ludwig's angina or spread of infection.  Will treat with penicillin and pain medicine.   On reassessment, patient reported significant improvement. No vomiting while in the emergency department, no nausea.  Complete resolution of headache. Labs unremarkable other  than mild leukocytosis of 11.1 and mild hypokalemia at 3.4.  Temperature and heart rate trending down in the department. She is well-appearing.  Will discharge patient home with antibiotics, symptomatic relief and close follow-up with a dentist and primary care provider.  Discussed strict return precautions and advised to return to the emergency department if experiencing any new or worsening symptoms. Instructions were understood and patient agreed with discharge plan.  Final Clinical Impressions(s) / ED Diagnoses   Final diagnoses:  Pain, dental  Dental infection    ED Discharge Orders        Ordered    amoxicillin-clavulanate (AUGMENTIN) 875-125 MG tablet  2 times daily     06/25/17 2100    ibuprofen (ADVIL,MOTRIN) 800 MG tablet  3 times daily     06/25/17 2102    acetaminophen (TYLENOL) 325 MG tablet  Every 6 hours PRN     06/25/17 2102    ondansetron (ZOFRAN ODT) 4 MG disintegrating tablet  Every 8 hours PRN     06/25/17 2106       Gregary Cromer 06/25/17 2121    Eber Hong, MD 07/03/17 [phone]

## 2017-09-12 ENCOUNTER — Other Ambulatory Visit: Payer: Self-pay

## 2017-09-12 ENCOUNTER — Encounter (HOSPITAL_COMMUNITY): Payer: Self-pay | Admitting: *Deleted

## 2017-09-12 ENCOUNTER — Emergency Department (HOSPITAL_COMMUNITY)
Admission: EM | Admit: 2017-09-12 | Discharge: 2017-09-12 | Disposition: A | Discharge status: Home / Self Care | Payer: 59 | Attending: Emergency Medicine | Admitting: Emergency Medicine

## 2017-09-12 DIAGNOSIS — Y929 Unspecified place or not applicable: Secondary | ICD-10-CM | POA: Insufficient documentation

## 2017-09-12 DIAGNOSIS — W57XXXA Bitten or stung by nonvenomous insect and other nonvenomous arthropods, initial encounter: Secondary | ICD-10-CM | POA: Insufficient documentation

## 2017-09-12 DIAGNOSIS — Y939 Activity, unspecified: Secondary | ICD-10-CM | POA: Insufficient documentation

## 2017-09-12 DIAGNOSIS — S90862A Insect bite (nonvenomous), left foot, initial encounter: Secondary | ICD-10-CM | POA: Insufficient documentation

## 2017-09-12 DIAGNOSIS — Y998 Other external cause status: Secondary | ICD-10-CM | POA: Insufficient documentation

## 2017-09-12 DIAGNOSIS — R030 Elevated blood-pressure reading, without diagnosis of hypertension: Secondary | ICD-10-CM | POA: Insufficient documentation

## 2017-09-12 MED ORDER — DOXYCYCLINE HYCLATE 100 MG PO CAPS: 100.0000 mg | ORAL_CAPSULE | Freq: Two times a day (BID) | ORAL | 0 refills | Status: DC

## 2017-09-12 MED ORDER — ONDANSETRON HCL 4 MG PO TABS
4.0000 mg | ORAL_TABLET | Freq: Once | ORAL | Status: AC
  Administered 2017-09-12: 4 mg via ORAL
  Filled 2017-09-12: qty 1

## 2017-09-12 MED ORDER — DOXYCYCLINE HYCLATE 100 MG PO TABS
100.0000 mg | ORAL_TABLET | Freq: Once | ORAL | Status: AC
  Administered 2017-09-12: 100 mg via ORAL
  Filled 2017-09-12: qty 1

## 2017-09-12 NOTE — ED Provider Notes (Signed)
[facility] EMERGENCY DEPARTMENT Provider Note   CSN: [id number] Arrival date & time: 09/12/17  1209     History   Chief Complaint Chief Complaint  Patient presents with  . Tick Bite    HPI Mandy Stewart is a 32 y.o. female.  Patient is a 31 year old female who presents to the emergency department because of a tick bite to the left foot.  Patient states that on July 5 she removed a tick from between her toes.  She noticed a small pustule area there.  She says she did not pay the whole lot of attention, but now she is having problems with feeling weak, nauseated, and occasionally having some diarrhea.  She has been able to eat and drink.  She is been keeping up with her fluids according to the patient.  She says that on yesterday she began to notice some redness in between the third and fourth toe on the left foot.  She noticed a dark spot under the bottom of the toe she became concerned that she may be coming down with a tick related illness.  She presents now for assessment.  There is been no high fever reported.  Is been no unusual rash noted.     Past Medical History:  Diagnosis Date  . Medical history non-contributory     Patient Active Problem List   Diagnosis Date Noted  . Acute cholecystitis 12/21/2014  . Cholelithiasis 12/21/2014    Past Surgical History:  Procedure Laterality Date  . CHOLECYSTECTOMY N/A 12/22/2014   Procedure: LAPAROSCOPIC CHOLECYSTECTOMY;  Surgeon: Franky Macho, MD;  Location: [facility];  Service: General;  Laterality: N/A;     OB History    Gravida  2   Para      Term      Preterm      AB  2   Living  0     SAB  2   TAB      Ectopic      Multiple      Live Births               Home Medications    Prior to Admission medications   Medication Sig Start Date End Date Taking? Authorizing Provider  acetaminophen (TYLENOL) 325 MG tablet Take 2 tablets (650 mg total) by mouth every 6 (six) hours as needed for mild pain or  moderate pain. 06/25/17   Mathews Robinsons B, PA-C  amoxicillin (AMOXIL) 500 MG capsule Take 1 capsule (500 mg total) by mouth 3 (three) times daily. 02/23/16   Cathren Laine, MD  cetirizine-pseudoephedrine (ZYRTEC-D) 5-120 MG tablet Take 1 tablet by mouth 2 (two) times daily as needed for allergies. 02/23/16   Cathren Laine, MD  cyclobenzaprine (FLEXERIL) 10 MG tablet Take 1 tablet (10 mg total) by mouth 3 (three) times daily as needed. 03/23/17   Triplett, Tammy, PA-C  esomeprazole (NEXIUM) 40 MG capsule Take 40 mg by mouth daily.    [provider]  HYDROcodone-homatropine (HYCODAN) 5-1.5 MG/5ML syrup Take 5 mLs by mouth every 6 (six) hours as needed. 04/20/17   Ivery Quale, PA-C  ibuprofen (ADVIL,MOTRIN) 800 MG tablet Take 1 tablet (800 mg total) by mouth 3 (three) times daily. 06/25/17   Georgiana Shore, PA-C  loratadine-pseudoephedrine (CLARITIN-D 12 HOUR) 5-120 MG tablet Take 1 tablet by mouth 2 (two) times daily. 04/20/17   Ivery Quale, PA-C  montelukast (SINGULAIR) 10 MG tablet Take 10 mg by mouth at bedtime as needed (for allergy  relief).     [provider]  ondansetron (ZOFRAN ODT) 4 MG disintegrating tablet Take 1 tablet (4 mg total) by mouth every 8 (eight) hours as needed for nausea or vomiting. 06/25/17   Mathews RobinsonsMitchell, Jessica B, PA-C  ondansetron (ZOFRAN) 4 MG tablet Take 1 tablet (4 mg total) by mouth every 6 (six) hours as needed for nausea or vomiting. 12/22/14   Franky MachoJenkins, Mark, MD  oxyCODONE-acetaminophen (PERCOCET) 7.5-325 MG tablet Take 1-2 tablets by mouth every 4 (four) hours as needed. 12/22/14   Franky MachoJenkins, Mark, MD  traMADol (ULTRAM) 50 MG tablet Take 1 tablet (50 mg total) by mouth every 6 (six) hours as needed. 04/28/15   Burgess AmorIdol, Julie, PA-C  traMADol (ULTRAM) 50 MG tablet Take 1 tablet (50 mg total) by mouth every 6 (six) hours as needed. 02/23/16   Cathren LaineSteinl, Kevin, MD    Family History No family history on file.  Social History Social History   Tobacco  Use  . Smoking status: Never Smoker  . Smokeless tobacco: Never Used  Substance Use Topics  . Alcohol use: Yes    Alcohol/week: 0.6 oz    Types: 1 Cans of beer per week    Comment: occasionally  . Drug use: No     Allergies   Codeine   Review of Systems Review of Systems  Constitutional: Negative for activity change.       All ROS Neg except as noted in HPI  HENT: Negative for nosebleeds.   Eyes: Negative for photophobia and discharge.  Respiratory: Negative for cough, shortness of breath and wheezing.   Cardiovascular: Negative for chest pain and palpitations.  Gastrointestinal: Negative for abdominal pain and blood in stool.  Genitourinary: Negative for dysuria, frequency and hematuria.  Musculoskeletal: Negative for arthralgias, back pain and neck pain.  Skin: Negative.  Negative for rash.  Neurological: Negative for dizziness, seizures and speech difficulty.  Psychiatric/Behavioral: Negative for confusion and hallucinations.     Physical Exam Updated Vital Signs BP (!) 164/4 (BP Location: Right Arm)   Pulse 84   Temp 98.6 F (37 C) (Oral)   Resp 12   Ht 5\' 1"  (1.549 m)   Wt 81.6 kg (180 lb)   SpO2 100%   BMI 34.01 kg/m   Physical Exam  Constitutional: She is oriented to person, place, and time. She appears well-developed and well-nourished.  Non-toxic appearance.  HENT:  Head: Normocephalic.  Right Ear: Tympanic membrane and external ear normal.  Left Ear: Tympanic membrane and external ear normal.  Eyes: Pupils are equal, round, and reactive to light. EOM and lids are normal.  Neck: Normal range of motion. Neck supple. Carotid bruit is not present.  Cardiovascular: Normal rate, regular rhythm, normal heart sounds, intact distal pulses and normal pulses.  Pulmonary/Chest: Breath sounds normal. No respiratory distress.  Abdominal: Soft. Bowel sounds are normal. There is no tenderness. There is no guarding.  Musculoskeletal: Normal range of motion.  There  is full range of motion of the left hip, knee, and ankle.  There is minimal redness between the third and fourth toe of the left foot.  The dorsalis pedis pulse is 2+.  The posterior tibial pulse is 2+.  Capillary refill is less than 2 seconds.  There is no drainage in between any of the toes on the left foot.  There is a dark scab at the plantar surface at the base of the fourth toe.  Patient has full range of motion of all toes.  There  is no red streaks appreciated.  Lymphadenopathy:       Head (right side): No submandibular adenopathy present.       Head (left side): No submandibular adenopathy present.    She has no cervical adenopathy.  Neurological: She is alert and oriented to person, place, and time. She has normal strength. No cranial nerve deficit or sensory deficit.  Skin: Skin is warm and dry.  Psychiatric: She has a normal mood and affect. Her speech is normal.  Nursing note and vitals reviewed.    ED Treatments / Results  Labs (all labs ordered are listed, but only abnormal results are displayed) Labs Reviewed - No data to display  EKG None  Radiology No results found.  Procedures Procedures (including critical care time)  Medications Ordered in ED Medications  doxycycline (VIBRA-TABS) tablet 100 mg (100 mg Oral Given 09/12/17 1448)  ondansetron (ZOFRAN) tablet 4 mg (4 mg Oral Given 09/12/17 1448)     Initial Impression / Assessment and Plan / ED Course  I have reviewed the triage vital signs and the nursing notes.  Pertinent labs & imaging results that were available during my care of the patient were reviewed by me and considered in my medical decision making (see chart for details).       Final Clinical Impressions(s) / ED Diagnoses MDM  Patient has an elevation in her blood pressure.  I have asked her to have this rechecked soon.  There is no evidence of cellulitis involving the foot.  There is no evidence in the history or the examination of a tickborne  illness.  The patient has a darkened spot at the plantar surface of the fourth toe.  There is minimal redness at the dorsal area between the third and fourth toe.  The patient will be treated with doxycycline as a precaution.  I have asked her to use warm Epson salt soaks.  I have asked her to see her primary physician or return to the emergency department if not improving.  Patient is in agreement with this plan.   Final diagnoses:  None    ED Discharge Orders    None       Ivery Quale, PA-C 09/12/17 1508    Donnetta Hutching, MD 09/13/17 1112

## 2017-09-12 NOTE — ED Triage Notes (Signed)
Pt was biten by a tick on 09/01/17 between her toes on her left foot. Pt reports feeling very weak, nauseated and having diarrhea since the tick bite. Pt reports she woke up this morning with her left foot having a red streak on it and itching where the tick was found.

## 2017-09-12 NOTE — Discharge Instructions (Addendum)
Please soak your foot in warm Epson salt soaks for about 10 to 15 minutes daily until this resolves.  Please use doxycycline 2 times daily with food.  Please see your primary physician or return to the emergency department if any signs of advancing infection, problems, or concerns.

## 2018-05-05 ENCOUNTER — Encounter (HOSPITAL_COMMUNITY): Payer: Self-pay | Admitting: Emergency Medicine

## 2018-05-05 ENCOUNTER — Emergency Department (HOSPITAL_COMMUNITY)
Admission: EM | Admit: 2018-05-05 | Discharge: 2018-05-06 | Disposition: A | Discharge status: Home / Self Care | Payer: Self-pay | Attending: Emergency Medicine | Admitting: Emergency Medicine

## 2018-05-05 ENCOUNTER — Other Ambulatory Visit: Payer: Self-pay

## 2018-05-05 DIAGNOSIS — R059 Cough, unspecified: Secondary | ICD-10-CM

## 2018-05-05 DIAGNOSIS — H65193 Other acute nonsuppurative otitis media, bilateral: Secondary | ICD-10-CM

## 2018-05-05 DIAGNOSIS — Z79899 Other long term (current) drug therapy: Secondary | ICD-10-CM | POA: Insufficient documentation

## 2018-05-05 DIAGNOSIS — R42 Dizziness and giddiness: Secondary | ICD-10-CM | POA: Insufficient documentation

## 2018-05-05 DIAGNOSIS — R05 Cough: Secondary | ICD-10-CM | POA: Insufficient documentation

## 2018-05-05 DIAGNOSIS — H748X3 Other specified disorders of middle ear and mastoid, bilateral: Secondary | ICD-10-CM | POA: Insufficient documentation

## 2018-05-05 NOTE — ED Triage Notes (Signed)
Pt c/o cough x 3 weeks and headache x 2 days. States she just feels "off". A&O x 4, ambulatory with steady gait, no weakness noted.

## 2018-05-06 MED ORDER — BENZONATATE 100 MG PO CAPS
100.0000 mg | ORAL_CAPSULE | Freq: Once | ORAL | Status: AC
Start: 2018-05-06 — End: 2018-05-06
  Administered 2018-05-06: 100 mg via ORAL
  Filled 2018-05-06: qty 1

## 2018-05-06 MED ORDER — FLUTICASONE PROPIONATE 50 MCG/ACT NA SUSP: 2.0000 | Freq: Every day | NASAL | 0 refills | Status: DC

## 2018-05-06 MED ORDER — MECLIZINE HCL 25 MG PO TABS
25.0000 mg | ORAL_TABLET | Freq: Once | ORAL | Status: AC
  Administered 2018-05-06: 25 mg via ORAL
  Filled 2018-05-06: qty 1

## 2018-05-06 MED ORDER — ONDANSETRON 4 MG PO TBDP: 4.0000 mg | ORAL_TABLET | Freq: Four times a day (QID) | ORAL | 0 refills | Status: DC | PRN

## 2018-05-06 MED ORDER — BENZONATATE 100 MG PO CAPS: 100.0000 mg | ORAL_CAPSULE | Freq: Three times a day (TID) | ORAL | 0 refills | Status: DC | PRN

## 2018-05-06 MED ORDER — CETIRIZINE HCL 10 MG PO CAPS: 10.0000 mg | ORAL_CAPSULE | Freq: Every day | ORAL | 0 refills | Status: DC

## 2018-05-06 NOTE — ED Provider Notes (Signed)
TIME SEEN: 12:59 AM  CHIEF COMPLAINT: Cough, headache, nausea and vomiting, ear pain, vertigo  HPI: Patient is a 33 year old female with no significant past medical history who presents to the emergency department with cough for the past 3 weeks.  States 3 weeks ago she was diagnosed with an influenza-like illness and had fever.  States symptoms improved after 1 week but she has continued to have a residual dry cough.  No shortness of breath.  States that time she coughs so hard that it causes her to have a headache.  States after her last coughing episode she got very hot and had one episode of vomiting.  She also has had bilateral ear discomfort feeling like her ears are full.  States she feels like the room is moving.  No diarrhea.  No current headache.  No abdominal pain.  Last fever was over 2 weeks ago.  ROS: See HPI Constitutional: no fever  Eyes: no drainage  ENT: no runny nose   Cardiovascular:  no chest pain  Resp: no SOB  GI:  vomiting GU: no dysuria Integumentary: no rash  Allergy: no hives  Musculoskeletal: no leg swelling  Neurological: no slurred speech ROS otherwise negative  PAST MEDICAL HISTORY/PAST SURGICAL HISTORY:  Past Medical History:  Diagnosis Date  . Medical history non-contributory     MEDICATIONS:  Prior to Admission medications   Medication Sig Start Date End Date Taking? Authorizing Provider  acetaminophen (TYLENOL) 325 MG tablet Take 2 tablets (650 mg total) by mouth every 6 (six) hours as needed for mild pain or moderate pain. 06/25/17   Mathews Robinsons B, PA-C  amoxicillin (AMOXIL) 500 MG capsule Take 1 capsule (500 mg total) by mouth 3 (three) times daily. 02/23/16   Cathren Laine, MD  cetirizine-pseudoephedrine (ZYRTEC-D) 5-120 MG tablet Take 1 tablet by mouth 2 (two) times daily as needed for allergies. 02/23/16   Cathren Laine, MD  cyclobenzaprine (FLEXERIL) 10 MG tablet Take 1 tablet (10 mg total) by mouth 3 (three) times daily as needed. 03/23/17    Triplett, Tammy, PA-C  doxycycline (VIBRAMYCIN) 100 MG capsule Take 1 capsule (100 mg total) by mouth 2 (two) times daily. 09/12/17   Ivery Quale, PA-C  esomeprazole (NEXIUM) 40 MG capsule Take 40 mg by mouth daily.    [provider]  HYDROcodone-homatropine (HYCODAN) 5-1.5 MG/5ML syrup Take 5 mLs by mouth every 6 (six) hours as needed. 04/20/17   Ivery Quale, PA-C  ibuprofen (ADVIL,MOTRIN) 800 MG tablet Take 1 tablet (800 mg total) by mouth 3 (three) times daily. 06/25/17   Georgiana Shore, PA-C  loratadine-pseudoephedrine (CLARITIN-D 12 HOUR) 5-120 MG tablet Take 1 tablet by mouth 2 (two) times daily. 04/20/17   Ivery Quale, PA-C  montelukast (SINGULAIR) 10 MG tablet Take 10 mg by mouth at bedtime as needed (for allergy relief).     [provider]  ondansetron (ZOFRAN ODT) 4 MG disintegrating tablet Take 1 tablet (4 mg total) by mouth every 8 (eight) hours as needed for nausea or vomiting. 06/25/17   Mathews Robinsons B, PA-C  ondansetron (ZOFRAN) 4 MG tablet Take 1 tablet (4 mg total) by mouth every 6 (six) hours as needed for nausea or vomiting. 12/22/14   Franky Macho, MD  oxyCODONE-acetaminophen (PERCOCET) 7.5-325 MG tablet Take 1-2 tablets by mouth every 4 (four) hours as needed. 12/22/14   Franky Macho, MD  traMADol (ULTRAM) 50 MG tablet Take 1 tablet (50 mg total) by mouth every 6 (six) hours as needed. 04/28/15  Burgess Amor, PA-C  traMADol (ULTRAM) 50 MG tablet Take 1 tablet (50 mg total) by mouth every 6 (six) hours as needed. 02/23/16   Cathren Laine, MD    ALLERGIES:  Allergies  Allergen Reactions  . Codeine Hives and Nausea And Vomiting    SOCIAL HISTORY:  Social History   Tobacco Use  . Smoking status: Never Smoker  . Smokeless tobacco: Never Used  Substance Use Topics  . Alcohol use: Yes    Alcohol/week: 1.0 standard drinks    Types: 1 Cans of beer per week    Comment: occasionally    FAMILY HISTORY: No family history on  file.  EXAM: BP (!) 109/92 (BP Location: Right Arm)   Pulse 76   Temp (!) 97.5 F (36.4 C) (Oral)   Resp 18   SpO2 100%  CONSTITUTIONAL: Alert and oriented and responds appropriately to questions. Well-appearing; well-nourished HEAD: Normocephalic EYES: Conjunctivae clear, pupils appear equal, EOMI ENT: normal nose; moist mucous membranes; No pharyngeal erythema or petechiae, no tonsillar hypertrophy or exudate, no uvular deviation, no unilateral swelling, no trismus or drooling, no muffled voice, normal phonation, no stridor, no dental caries present, no drainable dental abscess noted, no Ludwig's angina, tongue sits flat in the bottom of the mouth, no angioedema, no facial erythema or warmth, no facial swelling; no pain with movement of the neck.  TMs are clear bilaterally without erythema, purulence, bulging, perforation she does appear to have bilateral effusions.  No cerumen impaction or sign of foreign body in the external auditory canal. No inflammation, erythema or drainage from the external auditory canal. No signs of mastoiditis. No pain with manipulation of the pinna bilaterally. NECK: Supple, no meningismus, no nuchal rigidity, no LAD  CARD: RRR; S1 and S2 appreciated; no murmurs, no clicks, no rubs, no gallops RESP: Normal chest excursion without splinting or tachypnea; breath sounds clear and equal bilaterally; no wheezes, no rhonchi, no rales, no hypoxia or respiratory distress, speaking full sentences ABD/GI: Normal bowel sounds; non-distended; soft, non-tender, no rebound, no guarding, no peritoneal signs, no hepatosplenomegaly BACK:  The back appears normal and is non-tender to palpation, there is no CVA tenderness EXT: Normal ROM in all joints; non-tender to palpation; no edema; normal capillary refill; no cyanosis, no calf tenderness or swelling    SKIN: Normal color for age and race; warm; no rash NEURO: Moves all extremities equally PSYCH: The patient's mood and manner are  appropriate. Grooming and personal hygiene are appropriate.  MEDICAL DECISION MAKING: Patient here with residual cough, bilateral middle ear effusions, vertigo after flulike symptoms.  Lungs are completely clear without hypoxia and she is not febrile today.  I do not think she has pneumonia and I do not think she needs a chest x-ray.  She has bilateral ear effusions but no sign of otitis media or otitis externa.  I do not feel she needs antibiotics.  Recommended symptomatic relief with Zyrtec, Flonase to help with her ear effusions which may help with her vertigo.  We will also discharge with meclizine and Zofran to take as needed.  Recommended alternating Tylenol and Motrin as needed for pain.  She is well-appearing here without signs of meningitis, sepsis, bacteremia.  Patient comfortable with this plan.  Also provided with outpatient follow-up.   At this time, I do not feel there is any life-threatening condition present. I have reviewed and discussed all results (EKG, imaging, lab, urine as appropriate) and exam findings with patient/family. I have reviewed nursing notes and  appropriate previous records.  I feel the patient is safe to be discharged home without further emergent workup and can continue workup as an outpatient as needed. Discussed usual and customary return precautions. Patient/family verbalize understanding and are comfortable with this plan.  Outpatient follow-up has been provided as needed. All questions have been answered.      Cadon Raczka, Layla Maw, DO 05/06/18 0117

## 2018-05-06 NOTE — Discharge Instructions (Addendum)
You may alternate Tylenol 1000 mg every 6 hours as needed for pain and Ibuprofen 800 mg every 8 hours as needed for pain.  Please take Ibuprofen with food. ° °Steps to find a Primary Care Provider (PCP): ° °Call [phone] or [phone] to access "[place] Find a Doctor Service." ° °2.  You may also go on the [facility] website at [email or web address] ° °3.  [facility] also frequently accepts new patients. ° °[facility]  °[address] °[place] [address] °[phone] ° °4.  There are also multiple [facility], [facility] and [facility] practices throughout the [place] that are frequently accepting new patients. You may find a clinic that is close to your home and contact them. ° °[facility] °[email or web address] °[phone] ° °[facility] °[email or web address] ° °[facility] °[email or web address] °[phone] ° °[facility] °[email or web address] °[phone] ° °5.  Local Health Departments also can provide primary care services. ° °[facility]  °[address] °[place] [address] [phone] ° °[facility] °[address] °[place] [address] °[phone] ° °[facility] °[address]  °[place] [address] °[phone] ° ° °

## 2018-10-15 ENCOUNTER — Encounter (HOSPITAL_COMMUNITY): Payer: Self-pay | Admitting: Emergency Medicine

## 2018-10-15 ENCOUNTER — Ambulatory Visit (HOSPITAL_COMMUNITY)
Admission: EM | Admit: 2018-10-15 | Discharge: 2018-10-15 | Disposition: A | Discharge status: Home / Self Care | Payer: Self-pay | Attending: Family Medicine | Admitting: Family Medicine

## 2018-10-15 ENCOUNTER — Other Ambulatory Visit: Payer: Self-pay

## 2018-10-15 DIAGNOSIS — R110 Nausea: Secondary | ICD-10-CM

## 2018-10-15 DIAGNOSIS — S30861A Insect bite (nonvenomous) of abdominal wall, initial encounter: Secondary | ICD-10-CM

## 2018-10-15 DIAGNOSIS — W57XXXA Bitten or stung by nonvenomous insect and other nonvenomous arthropods, initial encounter: Secondary | ICD-10-CM

## 2018-10-15 MED ORDER — DOXYCYCLINE HYCLATE 100 MG PO CAPS: 100.0000 mg | ORAL_CAPSULE | Freq: Two times a day (BID) | ORAL | 0 refills | Status: DC

## 2018-10-15 MED ORDER — ONDANSETRON 4 MG PO TBDP: 4.0000 mg | ORAL_TABLET | Freq: Three times a day (TID) | ORAL | 0 refills | Status: DC | PRN

## 2018-10-15 NOTE — ED Provider Notes (Signed)
Mandy Stewart   [id number] 10/15/18 Arrival Time: 6759  ASSESSMENT & PLAN:  1. Insect bite of abdominal wall, initial encounter   2. Nausea without vomiting     Meds ordered this encounter  Medications  . doxycycline (VIBRAMYCIN) 100 MG capsule    Sig: Take 1 capsule (100 mg total) by mouth 2 (two) times daily.    Dispense:  20 capsule    Refill:  0  . ondansetron (ZOFRAN-ODT) 4 MG disintegrating tablet    Sig: Take 1 tablet (4 mg total) by mouth every 8 (eight) hours as needed for nausea or vomiting.    Dispense:  15 tablet    Refill:  0    Follow-up Information    [place].   Specialty: Urgent Care Why: If not improving over the next few days. Contact information: [address]          Will follow up with PCP or here if worsening or failing to improve as anticipated. Reviewed expectations re: course of current medical issues. Questions answered. Outlined signs and symptoms indicating need for more acute intervention. Patient verbalized understanding. After Visit Summary given.   SUBJECTIVE:  Mandy Stewart is a 34 y.o. female who presents with a skin complaint.   Location: left lateral abdominal wall Onset: gradual Duration: noticed 3-4 d ago; slight enlargement; "feels bigger and more painful" Associated pruritis? none Associated pain? mild to moderate Drainage? No  Known trigger? questions insect bite; no known tick bite New soaps/lotions/topicals/detergents/environmental exposures? No Contacts with similar? No Recent travel? No  Other associated symptoms: none Therapies tried thus far: none Arthralgia or myalgia? none Recent illness? none Fever? none New medications? none No specific aggravating or alleviating factors reported. Does reports feeling very fatigued over the past few days. Mild nausea without emesis. No abdominal pain.  ROS: As per HPI.   OBJECTIVE: T 98.8 HR 82 RR 14 General appearance: alert; no distress Lungs: clear to auscultation bilaterally Heart: regular rate and rhythm Extremities: no edema Skin: warm and dry; left lateral abdominal wall around bra line with approx 1 x 1.5 cm area of firm, thickened skin; overlying erythema; no fluctuance; tender to palpation; no bleeding or drainage Psychological: alert and cooperative; normal mood and affect  Allergies  Allergen Reactions  . Codeine Hives and Nausea And Vomiting    Past Medical History:  Diagnosis Date  . Medical history non-contributory    Social History   Socioeconomic History  . Marital status: Married    Spouse name: Not on file  . Number of children: Not on file  . Years of education: Not on file  . Highest education level: Not on file  Occupational History  . Not on file  Social Needs  . Financial resource strain: Not on file  . Food insecurity    Worry: Not on file    Inability: Not on file  . Transportation needs    Medical: Not on file    Non-medical: Not on file  Tobacco Use  . Smoking status: Never Smoker  . Smokeless tobacco: Never Used  Substance and Sexual Activity  . Alcohol use: Yes    Alcohol/week: 1.0 standard drinks    Types: 1 Cans of beer per week    Comment: occasionally  . Drug use: No  . Sexual activity: Yes    Birth control/protection: None  Lifestyle  . Physical activity    Days per week:  Not on file    Minutes per session: Not on file  . Stress: Not on file  Relationships  . Social Musicianconnections    Talks on phone: Not on file    Gets together: Not on file    Attends religious service: Not on file    Active member of club or organization: Not on file    Attends meetings of clubs or organizations: Not on file    Relationship status: Not on file  . Intimate partner violence    Fear of current or ex partner: Not on file    Emotionally abused: Not on file    Physically abused: Not on file    Forced sexual  activity: Not on file  Other Topics Concern  . Not on file  Social History Narrative  . Not on file    Past Surgical History:  Procedure Laterality Date  . CHOLECYSTECTOMY N/A 12/22/2014   Procedure: LAPAROSCOPIC CHOLECYSTECTOMY;  Surgeon: Franky MachoMark Jenkins, MD;  Location: [facility];  Service: General;  Laterality: Vertis KelchN/A;     Remedios Mckone, MD 10/15/18 1222

## 2018-10-15 NOTE — ED Triage Notes (Signed)
Pt presents to [facility] for assessment of left sided rib mass x  4 days (possible absess), with nausea and "not feeling myself".  States she may have been bitten by something.

## 2019-06-04 ENCOUNTER — Encounter: Payer: Self-pay | Admitting: Obstetrics & Gynecology

## 2019-06-04 ENCOUNTER — Other Ambulatory Visit (HOSPITAL_COMMUNITY)
Admission: RE | Admit: 2019-06-04 | Discharge: 2019-06-04 | Disposition: A | Discharge status: Home / Self Care | Payer: PRIVATE HEALTH INSURANCE | Source: Ambulatory Visit | Attending: Obstetrics & Gynecology | Admitting: Obstetrics & Gynecology

## 2019-06-04 ENCOUNTER — Other Ambulatory Visit: Payer: Self-pay

## 2019-06-04 ENCOUNTER — Ambulatory Visit (INDEPENDENT_AMBULATORY_CARE_PROVIDER_SITE_OTHER): Payer: PRIVATE HEALTH INSURANCE | Admitting: Obstetrics & Gynecology

## 2019-06-04 VITALS — BP 151/96 | HR 99 | Wt 213.0 lb

## 2019-06-04 DIAGNOSIS — Z01411 Encounter for gynecological examination (general) (routine) with abnormal findings: Secondary | ICD-10-CM

## 2019-06-04 DIAGNOSIS — Z01419 Encounter for gynecological examination (general) (routine) without abnormal findings: Secondary | ICD-10-CM

## 2019-06-04 DIAGNOSIS — R87610 Atypical squamous cells of undetermined significance on cytologic smear of cervix (ASC-US): Secondary | ICD-10-CM

## 2019-06-04 DIAGNOSIS — R030 Elevated blood-pressure reading, without diagnosis of hypertension: Secondary | ICD-10-CM | POA: Diagnosis not present

## 2019-06-04 DIAGNOSIS — N96 Recurrent pregnancy loss: Secondary | ICD-10-CM | POA: Diagnosis not present

## 2019-06-04 DIAGNOSIS — Z1151 Encounter for screening for human papillomavirus (HPV): Secondary | ICD-10-CM | POA: Diagnosis not present

## 2019-06-04 DIAGNOSIS — R87810 Cervical high risk human papillomavirus (HPV) DNA test positive: Secondary | ICD-10-CM | POA: Insufficient documentation

## 2019-06-04 DIAGNOSIS — N913 Primary oligomenorrhea: Secondary | ICD-10-CM

## 2019-06-04 MED ORDER — PROMETHAZINE HCL 25 MG PO TABS: 25.0000 mg | ORAL_TABLET | Freq: Four times a day (QID) | ORAL | 2 refills | Status: DC | PRN

## 2019-06-04 MED ORDER — MEDROXYPROGESTERONE ACETATE 10 MG PO TABS: 10.0000 mg | ORAL_TABLET | Freq: Every day | ORAL | 2 refills | Status: DC

## 2019-06-04 NOTE — Patient Instructions (Signed)
Preventive Care 21-33 Years Old, Female Preventive care refers to visits with your health care provider and lifestyle choices that can promote health and wellness. This includes:  A yearly physical exam. This may also be called an annual well check.  Regular dental visits and eye exams.  Immunizations.  Screening for certain conditions.  Healthy lifestyle choices, such as eating a healthy diet, getting regular exercise, not using drugs or products that contain nicotine and tobacco, and limiting alcohol use. What can I expect for my preventive care visit? Physical exam Your health care provider will check your:  Height and weight. This may be used to calculate body mass index (BMI), which tells if you are at a healthy weight.  Heart rate and blood pressure.  Skin for abnormal spots. Counseling Your health care provider may ask you questions about your:  Alcohol, tobacco, and drug use.  Emotional well-being.  Home and relationship well-being.  Sexual activity.  Eating habits.  Work and work environment.  Method of birth control.  Menstrual cycle.  Pregnancy history. What immunizations do I need?  Influenza (flu) vaccine  This is recommended every year. Tetanus, diphtheria, and pertussis (Tdap) vaccine  You may need a Td booster every 10 years. Varicella (chickenpox) vaccine  You may need this if you have not been vaccinated. Human papillomavirus (HPV) vaccine  If recommended by your health care provider, you may need three doses over 6 months. Measles, mumps, and rubella (MMR) vaccine  You may need at least one dose of MMR. You may also need a second dose. Meningococcal conjugate (MenACWY) vaccine  One dose is recommended if you are age 19-21 years and a first-year college student living in a residence hall, or if you have one of several medical conditions. You may also need additional booster doses. Pneumococcal conjugate (PCV13) vaccine  You may need  this if you have certain conditions and were not previously vaccinated. Pneumococcal polysaccharide (PPSV23) vaccine  You may need one or two doses if you smoke cigarettes or if you have certain conditions. Hepatitis A vaccine  You may need this if you have certain conditions or if you travel or work in places where you may be exposed to hepatitis A. Hepatitis B vaccine  You may need this if you have certain conditions or if you travel or work in places where you may be exposed to hepatitis B. Haemophilus influenzae type b (Hib) vaccine  You may need this if you have certain conditions. You may receive vaccines as individual doses or as more than one vaccine together in one shot (combination vaccines). Talk with your health care provider about the risks and benefits of combination vaccines. What tests do I need?  Blood tests  Lipid and cholesterol levels. These may be checked every 5 years starting at age 20.  Hepatitis C test.  Hepatitis B test. Screening  Diabetes screening. This is done by checking your blood sugar (glucose) after you have not eaten for a while (fasting).  Sexually transmitted disease (STD) testing.  BRCA-related cancer screening. This may be done if you have a family history of breast, ovarian, tubal, or peritoneal cancers.  Pelvic exam and Pap test. This may be done every 3 years starting at age 21. Starting at age 30, this may be done every 5 years if you have a Pap test in combination with an HPV test. Talk with your health care provider about your test results, treatment options, and if necessary, the need for more tests.   Follow these instructions at home: Eating and drinking   Eat a diet that includes fresh fruits and vegetables, whole grains, lean protein, and low-fat dairy.  Take vitamin and mineral supplements as recommended by your health care provider.  Do not drink alcohol if: ? Your health care provider tells you not to drink. ? You are  pregnant, may be pregnant, or are planning to become pregnant.  If you drink alcohol: ? Limit how much you have to 0-1 drink a day. ? Be aware of how much alcohol is in your drink. In the [place] one drink equals one 12 oz bottle of beer (355 mL), one 5 oz glass of wine (148 mL), or one 1 oz glass of hard liquor (44 mL). Lifestyle  Take daily care of your teeth and gums.  Stay active. Exercise for at least 30 minutes on 5 or more days each week.  Do not use any products that contain nicotine or tobacco, such as cigarettes, e-cigarettes, and chewing tobacco. If you need help quitting, ask your health care provider.  If you are sexually active, practice safe sex. Use a condom or other form of birth control (contraception) in order to prevent pregnancy and STIs (sexually transmitted infections). If you plan to become pregnant, see your health care provider for a preconception visit. What's next?  Visit your health care provider once a year for a well check visit.  Ask your health care provider how often you should have your eyes and teeth checked.  Stay up to date on all vaccines. This information is not intended to replace advice given to you by your health care provider. Make sure you discuss any questions you have with your health care provider. Document Revised: 10/26/2017 Document Reviewed: 10/26/2017 Elsevier Patient Education  2020 [employer].

## 2019-06-04 NOTE — Progress Notes (Signed)
GYNECOLOGY ANNUAL PREVENTATIVE CARE ENCOUNTER NOTE  History:     Mandy Stewart is a 34 y.o. G39P0030 female here for a routine annual gynecologic exam.  Current complaints: not having periods.  Last period was summer 2020 and was light, the one before that was 3 years ago. Negative home UPTs.  No abnormal hair growth, acne, nipple discharge.  Has gained about 20 pounds in the last year, but has always been around 170-180 lb. History of heavy periods in past, tried different hormonal preparations but Depo Provera helped. Came off Depo Provera over 3 years ago. Wants to get pregnant, has been trying for two years. History of three early SABs since age 29, last one was in 2013.    Denies abnormal vaginal, discharge, pelvic pain, problems with intercourse or other gynecologic concerns.    Gynecologic History No LMP recorded. (Menstrual status: Irregular Periods). Contraception: none Last Pap: 2016. Results were: normal  Obstetric History OB History  Gravida Para Term Preterm AB Living  3       3 0  SAB TAB Ectopic Multiple Live Births  3            # Outcome Date GA Lbr Len/2nd Weight Sex Delivery Anes PTL Lv  3 SAB           2 SAB           1 SAB             Past Medical History:  Diagnosis Date  . Medical history non-contributory     Past Surgical History:  Procedure Laterality Date  . CHOLECYSTECTOMY N/A 12/22/2014   Procedure: LAPAROSCOPIC CHOLECYSTECTOMY;  Surgeon: Aviva Signs, MD;  Location: [facility];  Service: General;  Laterality: N/A;    Current Outpatient Medications on File Prior to Visit  Medication Sig Dispense Refill  . Cetirizine HCl (ZYRTEC ALLERGY) 10 MG CAPS Take 1 capsule (10 mg total) by mouth daily. 30 capsule 0  . ibuprofen (ADVIL,MOTRIN) 800 MG tablet Take 1 tablet (800 mg total) by mouth 3 (three) times daily. 21 tablet 0  . ondansetron (ZOFRAN-ODT) 4 MG disintegrating tablet Take 1 tablet (4 mg total) by mouth every 8 (eight) hours as needed for nausea or  vomiting. 15 tablet 0  . acetaminophen (TYLENOL) 325 MG tablet Take 2 tablets (650 mg total) by mouth every 6 (six) hours as needed for mild pain or moderate pain. 30 tablet 0  . cetirizine-pseudoephedrine (ZYRTEC-D) 5-120 MG tablet Take 1 tablet by mouth 2 (two) times daily as needed for allergies. 20 tablet 0  . cyclobenzaprine (FLEXERIL) 10 MG tablet Take 1 tablet (10 mg total) by mouth 3 (three) times daily as needed. 21 tablet 0  . doxycycline (VIBRAMYCIN) 100 MG capsule Take 1 capsule (100 mg total) by mouth 2 (two) times daily. 20 capsule 0  . esomeprazole (NEXIUM) 40 MG capsule Take 40 mg by mouth daily.    . fluticasone (FLONASE) 50 MCG/ACT nasal spray Place 2 sprays into both nostrils daily. 16 g 0  . [DISCONTINUED] montelukast (SINGULAIR) 10 MG tablet Take 10 mg by mouth at bedtime as needed (for allergy relief).      No current facility-administered medications on file prior to visit.    Allergies  Allergen Reactions  . Codeine Hives and Nausea And Vomiting    Social History:  reports that she has never smoked. She has never used smokeless tobacco. She reports current alcohol use of about 1.0 standard drinks of  alcohol per week. She reports that she does not use drugs.  History reviewed. No pertinent family history.  The following portions of the patient's history were reviewed and updated as appropriate: allergies, current medications, past family history, past medical history, past social history, past surgical history and problem list.  Review of Systems Pertinent items noted in HPI and remainder of comprehensive ROS otherwise negative.  Physical Exam:  BP (!) 151/96   Pulse 99   Wt 213 lb (96.6 kg)   BMI 40.25 kg/m  CONSTITUTIONAL: Well-developed, well-nourished female in no acute distress.  HENT:  Normocephalic, atraumatic, External right and left ear normal. Oropharynx is clear and moist EYES: Conjunctivae and EOM are normal. Pupils are equal, round, and reactive  to light. No scleral icterus.  NECK: Normal range of motion, supple, no masses.  Normal thyroid.  SKIN: Skin is warm and dry. No rash noted. Not diaphoretic. No erythema. No pallor. MUSCULOSKELETAL: Normal range of motion. No tenderness.  No cyanosis, clubbing, or edema.   NEUROLOGIC: Alert and oriented to person, place, and time. Normal reflexes, muscle tone coordination. No cranial nerve deficit noted. PSYCHIATRIC: Normal mood and affect. Normal behavior. Normal judgment and thought content. CARDIOVASCULAR: Normal heart rate noted, regular rhythm RESPIRATORY: Clear to auscultation bilaterally. Effort and breath sounds normal, no problems with respiration noted. BREASTS: Symmetric in size. No masses, tenderness, skin changes, nipple drainage, or lymphadenopathy bilaterally.  Performed in the presence of a chaperone. ABDOMEN: Soft, obese, no distention appreciated.  No tenderness, rebound or guarding.  PELVIC: Normal appearing external genitalia and urethral meatus; normal appearing vaginal mucosa and cervix.  Normal appearing discharge.  Pap smear obtained.  Unable to palpate uterus or adnexa fully secondary to habitus.  Performed in the presence of a chaperone.   Assessment and Plan:    1. Primary oligomenorrhea Patient's irregular menses are most likely of an anovulatory etiology.  Differential diagnoses include PCOS, thyroid dysfunction, elevated prolactin dysfunction, stress, significant weight loss, exercise or anything can disturb the hypothalamic-pituitary-ovarian axis.  Will test HCG, HgA1C, FSH (for ovarian reserve), PRL, TSH, free and total testosterone (markers for hyperandrogenic state).  Will also order pelvic ultrasound to examine her ovaries and uterus; also to rule out any structural abnormalities. Will follow up results and manage accordingly.  Provera challenge to be done, will see results.  Recommended weight loss which helps with restoring ovulatory cycles, decreases glucose  intolerance with improvement of metabolic risk, improves fertility/pregnancy rates and helps with overall health.  Even modest weight loss (5 to 10 percent reduction in body weight) in women may result in these effects.  If patient meets criteria for PCOS, will recommend Metformin which can also help in restoring ovulatory cycles. - TSH+Prl+TestT+TestF+17OHP - Hemoglobin A1c - Follicle stimulating hormone - Beta hCG quant (ref lab) - US PELVIC COMPLETE WITH TRANSVAGINAL; Future - promethazine (PHENERGAN) 25 MG tablet; Take 1 tablet (25 mg total) by mouth every 6 (six) hours as needed for nausea or vomiting (migraines).  Dispense: 30 tablet; Refill: 2 - medroxyPROGESTERone (PROVERA) 10 MG tablet; Take 1 tablet (10 mg total) by mouth daily. 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 - OB Complications Profile  3. Well woman exam with routine gynecological exam - Comprehensive metabolic panel - VITAMIN D 25 Hydroxy (Vit-D Deficiency, Fractures) - Hepatitis B surface antigen - Hepatitis C antibody - HIV Antibody (routine  testing w rflx) - RPR - Cytology - PAP Will follow up results of pap smear, results and manage accordingly. Routine preventative health maintenance measures emphasized. Please refer to After Visit Summary for other counseling recommendations.       Jaynie Collins, MD, FACOG Obstetrician & Gynecologist, [facility] for [employer], [facility]

## 2019-06-05 ENCOUNTER — Encounter (HOSPITAL_BASED_OUTPATIENT_CLINIC_OR_DEPARTMENT_OTHER): Payer: Self-pay

## 2019-06-05 ENCOUNTER — Emergency Department (HOSPITAL_BASED_OUTPATIENT_CLINIC_OR_DEPARTMENT_OTHER)
Admission: EM | Admit: 2019-06-05 | Discharge: 2019-06-05 | Disposition: A | Discharge status: Home / Self Care | Payer: PRIVATE HEALTH INSURANCE | Attending: Emergency Medicine | Admitting: Emergency Medicine

## 2019-06-05 ENCOUNTER — Other Ambulatory Visit: Payer: Self-pay

## 2019-06-05 DIAGNOSIS — Z79899 Other long term (current) drug therapy: Secondary | ICD-10-CM | POA: Diagnosis not present

## 2019-06-05 DIAGNOSIS — K0889 Other specified disorders of teeth and supporting structures: Secondary | ICD-10-CM | POA: Diagnosis present

## 2019-06-05 MED ORDER — NAPROXEN 500 MG PO TABS: 500.0000 mg | ORAL_TABLET | Freq: Two times a day (BID) | ORAL | 0 refills | Status: DC

## 2019-06-05 MED ORDER — PENICILLIN V POTASSIUM 500 MG PO TABS: 500.0000 mg | ORAL_TABLET | Freq: Three times a day (TID) | ORAL | 0 refills | Status: AC

## 2019-06-05 NOTE — ED Provider Notes (Signed)
[facility] EMERGENCY DEPARTMENT Provider Note   CSN: [id number] Arrival date & time: 06/05/19  2111    History Chief Complaint  Patient presents with  . Dental Pain    Mandy Stewart is a 34 y.o. female with past medical history who presents for evaluation of dental pain.  Left upper dental pain x1 week.  Has a draining pocket to her right upper dentition.  She is not followed by dentistry.  No facial swelling.  No intraoral swelling.  Denies fever, chills, nausea, vomiting, drooling, dysphagia, trismus, facial swelling, facial pain or neck pain.  Denies aggravating alleviating factors.  She is tolerating p.o. intake at home without difficulties.  Rates her pain a 7/10.  History obtained from patient and past medical record.  No interpreter is used.  HPI     Past Medical History:  Diagnosis Date  . Medical history non-contributory     Patient Active Problem List   Diagnosis Date Noted  . Acute cholecystitis 12/21/2014  . Cholelithiasis 12/21/2014    Past Surgical History:  Procedure Laterality Date  . CHOLECYSTECTOMY N/A 12/22/2014   Procedure: LAPAROSCOPIC CHOLECYSTECTOMY;  Surgeon: Franky Macho, MD;  Location: [facility];  Service: General;  Laterality: N/A;     OB History    Gravida  3   Para      Term      Preterm      AB  3   Living  0     SAB  3   TAB      Ectopic      Multiple      Live Births              No family history on file.  Social History   Tobacco Use  . Smoking status: Never Smoker  . Smokeless tobacco: Never Used  Substance Use Topics  . Alcohol use: Yes    Alcohol/week: 1.0 standard drinks    Types: 1 Cans of beer per week    Comment: occasionally  . Drug use: No    Home Medications Prior to Admission medications   Medication Sig Start Date End Date Taking? Authorizing Provider  acetaminophen (TYLENOL) 325 MG tablet Take 2 tablets (650 mg total) by mouth every 6 (six) hours as needed for mild pain or moderate  pain. 06/25/17   Georgiana Shore, PA-C  Cetirizine HCl (ZYRTEC ALLERGY) 10 MG CAPS Take 1 capsule (10 mg total) by mouth daily. 05/06/18   Ward, Layla Maw, DO  cetirizine-pseudoephedrine (ZYRTEC-D) 5-120 MG tablet Take 1 tablet by mouth 2 (two) times daily as needed for allergies. 02/23/16   Cathren Laine, MD  cyclobenzaprine (FLEXERIL) 10 MG tablet Take 1 tablet (10 mg total) by mouth 3 (three) times daily as needed. 03/23/17   Triplett, Tammy, PA-C  doxycycline (VIBRAMYCIN) 100 MG capsule Take 1 capsule (100 mg total) by mouth 2 (two) times daily. 10/15/18   Mardella Layman, MD  esomeprazole (NEXIUM) 40 MG capsule Take 40 mg by mouth daily.    [provider]  fluticasone (FLONASE) 50 MCG/ACT nasal spray Place 2 sprays into both nostrils daily. 05/06/18   Ward, Layla Maw, DO  ibuprofen (ADVIL,MOTRIN) 800 MG tablet Take 1 tablet (800 mg total) by mouth 3 (three) times daily. 06/25/17   Georgiana Shore, PA-C  medroxyPROGESTERone (PROVERA) 10 MG tablet Take 1 tablet (10 mg total) by mouth daily. Use for ten days 06/04/19   Anyanwu, Jethro Bastos, MD  naproxen (NAPROSYN) 500 MG  tablet Take 1 tablet (500 mg total) by mouth 2 (two) times daily. 06/05/19   Fady Stamps A, PA-C  ondansetron (ZOFRAN-ODT) 4 MG disintegrating tablet Take 1 tablet (4 mg total) by mouth every 8 (eight) hours as needed for nausea or vomiting. 10/15/18   Mardella Layman, MD  penicillin v potassium (VEETID) 500 MG tablet Take 1 tablet (500 mg total) by mouth 3 (three) times daily for 7 days. 06/05/19 06/12/19  Henna Derderian A, PA-C  promethazine (PHENERGAN) 25 MG tablet Take 1 tablet (25 mg total) by mouth every 6 (six) hours as needed for nausea or vomiting (migraines). 06/04/19   Anyanwu, Jethro Bastos, MD  montelukast (SINGULAIR) 10 MG tablet Take 10 mg by mouth at bedtime as needed (for allergy relief).   10/15/18  [provider]    Allergies    Codeine  Review of Systems   Review of Systems  Constitutional: Negative.     HENT: Positive for dental problem.   Respiratory: Negative.   Cardiovascular: Negative.   Gastrointestinal: Negative.   Genitourinary: Negative.   Musculoskeletal: Negative.   Skin: Negative.   All other systems reviewed and are negative.   Physical Exam Updated Vital Signs BP (!) 180/111 (BP Location: Left Arm)   Pulse 84   Temp 98.2 F (36.8 C) (Oral)   Resp 16   Ht 5' 1.5" (1.562 m)   Wt 96 kg   SpO2 100%   BMI 39.35 kg/m   Physical Exam Vitals and nursing note reviewed.  Constitutional:      General: She is not in acute distress.    Appearance: She is well-developed. She is not ill-appearing, toxic-appearing or diaphoretic.  HENT:     Head: Normocephalic and atraumatic.     Jaw: There is normal jaw occlusion.     Comments: No facial swelling    Nose: Nose normal.     Mouth/Throat:     Lips: Pink.     Mouth: Mucous membranes are moist.     Dentition: Abnormal dentition. Dental tenderness, gingival swelling and dental abscesses present.     Pharynx: Oropharynx is clear. Uvula midline.     Tonsils: No tonsillar exudate or tonsillar abscesses. 0 on the right. 0 on the left.     Comments: Gingival erythema with draining fluid collection to left upper dentition.  No intraoral swelling.  Tongue midline.  No sublingual swelling.  No drooling, dysphagia or trismus. Eyes:     Pupils: Pupils are equal, round, and reactive to light.  Cardiovascular:     Rate and Rhythm: Normal rate.     Pulses: Normal pulses.     Heart sounds: Normal heart sounds.  Pulmonary:     Effort: Pulmonary effort is normal. No respiratory distress.     Breath sounds: Normal breath sounds. No stridor.  Abdominal:     General: Bowel sounds are normal. There is no distension.     Tenderness: There is no abdominal tenderness. There is no guarding.  Musculoskeletal:        General: Normal range of motion.     Cervical back: Normal range of motion.  Skin:    General: Skin is warm and dry.   Neurological:     Mental Status: She is alert.     ED Results / Procedures / Treatments   Labs (all labs ordered are listed, but only abnormal results are displayed) Labs Reviewed - No data to display  EKG None  Radiology No results found.  Procedures Procedures (including critical care time)  Medications Ordered in ED Medications - No data to display  ED Course  I have reviewed the triage vital signs and the nursing notes.  Pertinent labs & imaging results that were available during my care of the patient were reviewed by me and considered in my medical decision making (see chart for details).  Patient presents for evaluation of left-sided dental pain x1 week.  She has no facial swelling.  No drooling, dysphagia or trismus.  She is tolerating p.o. intake at home without difficulty.  She does have some gingival erythema and swelling to her left upper dentition.  She has some mild fluid drainage.  Discussed antibiotics and warm compresses at home versus further drainage here in the emergency department.  She declined further drainage of her likely periapical abscess.  She does not appear septic or ill.  Will DC home with anti-inflammatories, dentistry follow-up and antibiotics.  She does have hypertension here in the emergency department however denies headache, lightheadedness, dizziness, neck pain, neck stiffness, nausea, vomiting or chest pain.  Low suspicion for hypertensive urgency or emergency.  She will follow-up outpatient with her PCP for this.  Discussed return precautions.  The patient has been appropriately medically screened and/or stabilized in the ED. I have low suspicion for any other emergent medical condition which would require further screening, evaluation or treatment in the ED or require inpatient management.  Patient is hemodynamically stable and in no acute distress.  Patient able to ambulate in department prior to ED.  Evaluation does not show acute pathology  that would require ongoing or additional emergent interventions while in the emergency department or further inpatient treatment.  I have discussed the diagnosis with the patient and answered all questions.  Pain is been managed while in the emergency department and patient has no further complaints prior to discharge.  Patient is comfortable with plan discussed in room and is stable for discharge at this time.  I have discussed strict return precautions for returning to the emergency department.  Patient was encouraged to follow-up with PCP/specialist refer to at discharge.    MDM Rules/Calculators/A&P                       Final Clinical Impression(s) / ED Diagnoses Final diagnoses:  Pain, dental    Rx / DC Orders ED Discharge Orders         Ordered    penicillin v potassium (VEETID) 500 MG tablet  3 times daily     06/05/19 2141    naproxen (NAPROSYN) 500 MG tablet  2 times daily     06/05/19 2141           Clementina Mareno A, PA-C 06/05/19 2141    Veryl Speak, MD 06/05/19 2314

## 2019-06-05 NOTE — Discharge Instructions (Signed)
Take the anti-inflammatories and apply warm compress to your face.  Take the antibiotics.  Follow-up with dentistry.  Your pain should get better

## 2019-06-05 NOTE — ED Triage Notes (Signed)
Pt c/o left upper toothache x 1 week with a "pus pocket"-NAD-steady gait

## 2019-06-10 LAB — CYTOLOGY - PAP
Chlamydia: NEGATIVE
Comment: NEGATIVE
Comment: NEGATIVE
Comment: NEGATIVE
Comment: NEGATIVE
Comment: NEGATIVE
Comment: NORMAL
Diagnosis: UNDETERMINED — AB
HPV 16: NEGATIVE
HPV 18 / 45: NEGATIVE
High risk HPV: POSITIVE — AB
Neisseria Gonorrhea: NEGATIVE
Trichomonas: NEGATIVE

## 2019-06-11 ENCOUNTER — Ambulatory Visit
Admission: RE | Admit: 2019-06-11 | Discharge: 2019-06-11 | Disposition: A | Discharge status: Home / Self Care | Payer: PRIVATE HEALTH INSURANCE | Source: Ambulatory Visit | Attending: Obstetrics & Gynecology | Admitting: Obstetrics & Gynecology

## 2019-06-11 ENCOUNTER — Telehealth: Payer: Self-pay | Admitting: *Deleted

## 2019-06-11 ENCOUNTER — Encounter: Payer: Self-pay | Admitting: Obstetrics & Gynecology

## 2019-06-11 DIAGNOSIS — N913 Primary oligomenorrhea: Secondary | ICD-10-CM

## 2019-06-11 NOTE — Telephone Encounter (Signed)
Attempted to call pt, voice mail was full and unable to leave a message. Will try again this afternoon.

## 2019-06-11 NOTE — Telephone Encounter (Signed)
Called pt and informed her of her pap results and need for colposcopy. Pt verbalizes and understands. Will get colpo scheduled.

## 2019-06-11 NOTE — Telephone Encounter (Signed)
-----   Message from Ugonna A Anyanwu, MD sent at 06/11/2019  8:46 AM EDT ----- Please schedule patient for colposcopy for ASCUS +HPV pap done on 06/04/2019. Please call to inform patient of results and need for appointment.  

## 2019-06-11 NOTE — Telephone Encounter (Signed)
-----   Message from Tereso Newcomer, MD sent at 06/11/2019  8:46 AM EDT ----- Please schedule patient for colposcopy for ASCUS +HPV pap done on 06/04/2019. Please call to inform patient of results and need for appointment.

## 2019-06-12 ENCOUNTER — Encounter: Payer: Self-pay | Admitting: Obstetrics & Gynecology

## 2019-06-12 ENCOUNTER — Telehealth: Payer: Self-pay | Admitting: *Deleted

## 2019-06-12 DIAGNOSIS — R9389 Abnormal findings on diagnostic imaging of other specified body structures: Secondary | ICD-10-CM | POA: Insufficient documentation

## 2019-06-12 NOTE — Telephone Encounter (Signed)
Pt informed of ultrasound results and recommendations from Dr A. Pt verbalizes and understands.

## 2019-06-12 NOTE — Telephone Encounter (Signed)
-----   Message from Tereso Newcomer, MD sent at 06/12/2019  8:31 AM EDT ----- Patient needs endometrial biopsy given abnormally thickened and heterogenous endometrium seen on ultrasound.  Need to evaluate for abnormal cells. Can do at same time as colposcopy.  Please call to inform patient of results and recommendations.  Jaynie Collins, MD

## 2019-06-12 NOTE — Progress Notes (Signed)
Patient needs endometrial biopsy given abnormally thickened and heterogenous endometrium seen on ultrasound.  Need to evaluate for abnormal cells. Can do at same time as colposcopy.  Please call to inform patient of results and recommendations.  Jaynie Collins, MD

## 2019-06-12 NOTE — Telephone Encounter (Signed)
Attempted to call pt, voice mail full. Will try again later today.

## 2019-06-12 NOTE — Telephone Encounter (Signed)
-----   Message from Ugonna A Anyanwu, MD sent at 06/12/2019  8:31 AM EDT ----- Patient needs endometrial biopsy given abnormally thickened and heterogenous endometrium seen on ultrasound.  Need to evaluate for abnormal cells. Can do at same time as colposcopy.  Please call to inform patient of results and recommendations.  Ugonna Anyanwu, MD 

## 2019-07-16 ENCOUNTER — Encounter: Payer: Self-pay | Admitting: Obstetrics & Gynecology

## 2019-07-16 ENCOUNTER — Other Ambulatory Visit (HOSPITAL_COMMUNITY)
Admission: RE | Admit: 2019-07-16 | Discharge: 2019-07-16 | Disposition: A | Discharge status: Home / Self Care | Payer: PRIVATE HEALTH INSURANCE | Source: Ambulatory Visit | Attending: Obstetrics & Gynecology | Admitting: Obstetrics & Gynecology

## 2019-07-16 ENCOUNTER — Other Ambulatory Visit: Payer: Self-pay

## 2019-07-16 ENCOUNTER — Ambulatory Visit (INDEPENDENT_AMBULATORY_CARE_PROVIDER_SITE_OTHER): Payer: PRIVATE HEALTH INSURANCE | Admitting: Obstetrics & Gynecology

## 2019-07-16 VITALS — BP 149/100 | HR 84

## 2019-07-16 DIAGNOSIS — R9389 Abnormal findings on diagnostic imaging of other specified body structures: Secondary | ICD-10-CM | POA: Diagnosis present

## 2019-07-16 DIAGNOSIS — Z3202 Encounter for pregnancy test, result negative: Secondary | ICD-10-CM | POA: Diagnosis not present

## 2019-07-16 DIAGNOSIS — N840 Polyp of corpus uteri: Secondary | ICD-10-CM

## 2019-07-16 DIAGNOSIS — R87610 Atypical squamous cells of undetermined significance on cytologic smear of cervix (ASC-US): Secondary | ICD-10-CM

## 2019-07-16 DIAGNOSIS — N870 Mild cervical dysplasia: Secondary | ICD-10-CM

## 2019-07-16 DIAGNOSIS — Z7185 Encounter for immunization safety counseling: Secondary | ICD-10-CM

## 2019-07-16 DIAGNOSIS — R87810 Cervical high risk human papillomavirus (HPV) DNA test positive: Secondary | ICD-10-CM

## 2019-07-16 LAB — POCT URINE PREGNANCY: Preg Test, Ur: NEGATIVE

## 2019-07-16 NOTE — Progress Notes (Signed)
   GYNECOLOGY OFFICE PROCEDURE NOTE  35 y.o. 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  Also had ultrasound on 06/11/2019 that showed abnormally thickened 18 mm endometrium that was mildly heterogeneous in echogenicity with scattered areas of cystic change.  Discussed need for evaluation and surveillance for possible cervical and endometrial abnormalities with colposcopy, endocervical curettings and endometrial biopsy.  All questions answered. Risks of the biopsy including cramping, bleeding, infection, uterine perforation, inadequate specimen and need for additional procedures were discussed. The patient states she understands and agrees to undergo procedures today.  Patient given informed consent, signed copy in the chart, time out was performed.  Urine HCG was negative. Placed in lithotomy position.  Cervix viewed with speculum and colposcope after application of acetic acid.  Colposcopy adequate? Yes. Acetowhite lesion(s) noted at 5 o'clock; corresponding biopsy obtained.   Endocervical curetting specimen obtained. For the endometrial biopsy, the cervix was prepped with Betadine.  The 3 mm pipelle was introduced into the endometrial cavity without difficulty to a depth of 8 cm, and a moderate amount of tissue was obtained after one pass. Patient was very anxious and screaming during procedure, unable to do any more passes.  All specimens were labeled and sent to pathology. All instruments were removed from the patient's vagina. Minimal bleeding from the cervix was noted. Patient was given post procedure instructions.  Will follow up pathology and manage accordingly; patient will be contacted with results and recommendations.  Routine preventative health maintenance measures emphasized, counseled about HPV vaccine series.   Jaynie Collins, MD, FACOG Obstetrician & Gynecologist, [facility] for [employer], [facility]

## 2019-07-16 NOTE — Patient Instructions (Addendum)
ENDOMETRIAL BIOPSY AND COLPOSCOPY POST-PROCEDURE INSTRUCTIONS  1. You may take Ibuprofen, Naproxen or Tylenol for pain if needed.  Cramping should resolve within in 24 hours.  2. You may have a small amount of spotting.  You should wear a mini pad for the next few days.  3. You may have intercourse after 24 hours.  4. You need to call if you have any pelvic pain, fever, heavy bleeding or foul smelling vaginal discharge.  5. Shower or bathe as normal  6. We will call you within one week with results and schedule follow-up appointment if needed.    HPV (Human Papillomavirus) Vaccine: What You Need to Know 1. Why get vaccinated? HPV (Human papillomavirus) vaccine can prevent infection with some types of human papillomavirus. HPV infections can cause certain types of cancers including:  cervical, vaginal and vulvar cancers in women,  penile cancer in men, and  anal cancers in both men and women. HPV vaccine prevents infection from the HPV types that cause over 90% of these cancers. HPV is spread through intimate skin-to-skin or sexual contact. HPV infections are so common that nearly all men and women will get at least one type of HPV at some time in their lives. Most HPV infections go away by themselves within 2 years. But sometimes HPV infections will last longer and can cause cancers later in life. 2. HPV vaccine HPV vaccine is routinely recommended for adolescents at 67 or 33 years of age to ensure they are protected before they are exposed to the virus. HPV vaccine may be given beginning at age 47 years, and as late as age 55 years. Most people older than 26 years will not benefit from HPV vaccination. Talk with your health care provider if you want more information. Most children who get the first dose before 35 years of age need 2 doses of HPV vaccine. Anyone who gets the first dose on or after 33 years of age, and younger people with certain immunocompromising conditions, need 3  doses. Your health care provider can give you more information. HPV vaccine may be given at the same time as other vaccines. 3. Talk with your health care provider Tell your vaccine provider if the person getting the vaccine:  Has had an allergic reaction after a previous dose of HPV vaccine, or has any severe, life-threatening allergies.  Is pregnant. In some cases, your health care provider may decide to postpone HPV vaccination to a future visit. People with minor illnesses, such as a cold, may be vaccinated. People who are moderately or severely ill should usually wait until they recover before getting HPV vaccine. Your health care provider can give you more information. 4. Risks of a vaccine reaction  Soreness, redness, or swelling where the shot is given can happen after HPV vaccine.  Fever or headache can happen after HPV vaccine. People sometimes faint after medical procedures, including vaccination. Tell your provider if you feel dizzy or have vision changes or ringing in the ears. As with any medicine, there is a very remote chance of a vaccine causing a severe allergic reaction, other serious injury, or death. 5. What if there is a serious problem? An allergic reaction could occur after the vaccinated person leaves the clinic. If you see signs of a severe allergic reaction (hives, swelling of the face and throat, difficulty breathing, a fast heartbeat, dizziness, or weakness), call 9-1-1 and get the person to the nearest hospital. For other signs that concern you, call your health  care provider. Adverse reactions should be reported to the Vaccine Adverse Event Reporting System (VAERS). Your health care provider will usually file this report, or you can do it yourself. Visit the VAERS website at www.vaers.LAgents.no or call (236) 828-0286. VAERS is only for reporting reactions, and VAERS staff do not give medical advice. 6. The National Vaccine Injury Compensation Program The [employer]  Vaccine Injury Compensation Program (VICP) is a federal program that was created to compensate people who may have been injured by certain vaccines. Visit the VICP website at SpiritualWord.at or call (346)789-6303 to learn about the program and about filing a claim. There is a time limit to file a claim for compensation. 7. How can I learn more?  Ask your health care provider.  Call your local or state health department.  Contact the Centers for Disease Control and Prevention (CDC): ? Call (217) 665-5437 (1-800-CDC-INFO) or ? Visit CDC's website at PicCapture.uy Vaccine Information Statement HPV Vaccine (12/27/2017) This information is not intended to replace advice given to you by your health care provider. Make sure you discuss any questions you have with your health care provider. Document Revised: 06/05/2018 Document Reviewed: 09/26/2017 Elsevier Patient Education  2020 [employer].

## 2019-07-18 ENCOUNTER — Telehealth: Payer: Self-pay | Admitting: *Deleted

## 2019-07-18 LAB — SURGICAL PATHOLOGY

## 2019-07-18 NOTE — Telephone Encounter (Signed)
-----   Message from Tereso Newcomer, MD sent at 07/18/2019  9:26 AM EDT ----- CIN I on colposcopy biopsy with negative endocervical curettings.  Will repeat pap and HPV test in 12 months. Benign endometrial biopsy showed a benign polyp, no current intervention needed unless she has increased bleeding (currently not a concern).  Please call to inform patient of results and recommendations. Results were also released to MyChart and patient was given recommendations as indicated.  FINAL MICROSCOPIC DIAGNOSIS:   A. CERVIX, 5 O'CLOCK, BIOPSY:  - Low grade squamous intraepithelial lesion, CIN-I (mild dysplasia).   B. ENDOCERVIX, CURETTAGE:  - Benign endocervical glandular epithelium.   C. ENDOMETRIUM, BIOPSY:  - Benign endometrioid-type polyp. Endometrium with progestin effect.

## 2019-07-18 NOTE — Telephone Encounter (Signed)
Pt informed of results and recommendations from Dr Macon Large regarding her colpo and EMB. Pt verbalizes and understands

## 2020-04-07 ENCOUNTER — Other Ambulatory Visit: Payer: Self-pay | Admitting: Obstetrics & Gynecology

## 2020-04-07 DIAGNOSIS — N913 Primary oligomenorrhea: Secondary | ICD-10-CM

## 2020-04-23 ENCOUNTER — Encounter: Payer: Self-pay | Admitting: Radiology

## 2020-05-04 ENCOUNTER — Encounter (HOSPITAL_COMMUNITY): Payer: Self-pay

## 2020-05-04 ENCOUNTER — Ambulatory Visit (HOSPITAL_COMMUNITY)
Admission: EM | Admit: 2020-05-04 | Discharge: 2020-05-04 | Disposition: A | Discharge status: Home / Self Care | Payer: PRIVATE HEALTH INSURANCE | Attending: Family Medicine | Admitting: Family Medicine

## 2020-05-04 ENCOUNTER — Other Ambulatory Visit: Payer: Self-pay

## 2020-05-04 DIAGNOSIS — R109 Unspecified abdominal pain: Secondary | ICD-10-CM | POA: Diagnosis not present

## 2020-05-04 DIAGNOSIS — Z3202 Encounter for pregnancy test, result negative: Secondary | ICD-10-CM

## 2020-05-04 DIAGNOSIS — R112 Nausea with vomiting, unspecified: Secondary | ICD-10-CM | POA: Diagnosis not present

## 2020-05-04 DIAGNOSIS — R110 Nausea: Secondary | ICD-10-CM

## 2020-05-04 DIAGNOSIS — R197 Diarrhea, unspecified: Secondary | ICD-10-CM

## 2020-05-04 LAB — POCT URINALYSIS DIPSTICK, ED / UC
Bilirubin Urine: NEGATIVE
Glucose, UA: NEGATIVE mg/dL
Hgb urine dipstick: NEGATIVE
Ketones, ur: NEGATIVE mg/dL
Leukocytes,Ua: NEGATIVE
Nitrite: NEGATIVE
Protein, ur: NEGATIVE mg/dL
Specific Gravity, Urine: 1.02 (ref 1.005–1.030)
Urobilinogen, UA: 0.2 mg/dL (ref 0.0–1.0)
pH: 7 (ref 5.0–8.0)

## 2020-05-04 LAB — POC URINE PREG, ED: Preg Test, Ur: NEGATIVE

## 2020-05-04 MED ORDER — ONDANSETRON 4 MG PO TBDP: 4.0000 mg | ORAL_TABLET | Freq: Three times a day (TID) | ORAL | 0 refills | Status: DC | PRN

## 2020-05-04 NOTE — ED Triage Notes (Signed)
Pt presents with nausea, and cramping from the lower abdomen that radiates to the lower back x 1 day. Pt states the cramps radiates from the lower back to the shoulders. Pt states her muscles are aching.

## 2020-05-04 NOTE — ED Provider Notes (Signed)
[facility]    CSN: [id number] Arrival date & time: 05/04/20  1703      History   Chief Complaint Chief Complaint  Patient presents with  . Abdominal Pain  . Back Pain  . Nausea    HPI Mandy Stewart is a 35 y.o. female.   Patient here today with 2-day history of nausea, dry heaving, persistent diarrhea, lower abdominal cramping.  She also states that she has been having generalized body aches though unaware of any fevers, sweats, chills.  Denies chest pain, shortness of breath, cough, urinary symptoms, severe abdominal pain.  So far trying some Phenergan she had at home with mild relief of nausea symptoms.  Nephew recently had a GI virus and she has been around nephew's mother recently.  Unknown last menstrual period, irregular periods and she does not recall.  Not on any birth control.     Past Medical History:  Diagnosis Date  . Medical history non-contributory     Patient Active Problem List   Diagnosis Date Noted  . Thickened endometrium 06/12/2019  . 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 CHOLECYSTECTOMY N/A 12/22/2014   Procedure: LAPAROSCOPIC CHOLECYSTECTOMY;  Surgeon: Franky Macho, MD;  Location: [facility];  Service: General;  Laterality: N/A;    OB History    Gravida  3   Para      Term      Preterm      AB  3   Living  0     SAB  3   IAB      Ectopic      Multiple      Live Births               Home Medications    Prior to Admission medications   Medication Sig Start Date End Date Taking? Authorizing Provider  ondansetron (ZOFRAN ODT) 4 MG disintegrating tablet Take 1 tablet (4 mg total) by mouth every 8 (eight) hours as needed for nausea or vomiting. 05/04/20  Yes Particia Nearing, PA-C  acetaminophen (TYLENOL) 325 MG tablet Take 2 tablets (650 mg total) by mouth every 6 (six) hours as needed for mild pain or moderate pain. 06/25/17    Georgiana Shore, PA-C  Cetirizine HCl (ZYRTEC ALLERGY) 10 MG CAPS Take 1 capsule (10 mg total) by mouth daily. 05/06/18   Ward, Layla Maw, DO  cetirizine-pseudoephedrine (ZYRTEC-D) 5-120 MG tablet Take 1 tablet by mouth 2 (two) times daily as needed for allergies. 02/23/16   Cathren Laine, MD  cyclobenzaprine (FLEXERIL) 10 MG tablet Take 1 tablet (10 mg total) by mouth 3 (three) times daily as needed. 03/23/17   Triplett, Tammy, PA-C  doxycycline (VIBRAMYCIN) 100 MG capsule Take 1 capsule (100 mg total) by mouth 2 (two) times daily. 10/15/18   Mardella Layman, MD  esomeprazole (NEXIUM) 40 MG capsule Take 40 mg by mouth daily.    [provider]  fluticasone (FLONASE) 50 MCG/ACT nasal spray Place 2 sprays into both nostrils daily. 05/06/18   Ward, Layla Maw, DO  ibuprofen (ADVIL,MOTRIN) 800 MG tablet Take 1 tablet (800 mg total) by mouth 3 (three) times daily. 06/25/17   Georgiana Shore, PA-C  medroxyPROGESTERone (PROVERA) 10 MG tablet Take 1 tablet (10 mg total) by mouth daily. Use for ten days 06/04/19   Anyanwu, Jethro Bastos, MD  naproxen (NAPROSYN) 500 MG tablet Take 1 tablet (500  mg total) by mouth 2 (two) times daily. 06/05/19   Henderly, Britni A, PA-C  ondansetron (ZOFRAN-ODT) 4 MG disintegrating tablet Take 1 tablet (4 mg total) by mouth every 8 (eight) hours as needed for nausea or vomiting. 10/15/18   Mardella Layman, MD  promethazine (PHENERGAN) 25 MG tablet TAKE 1 TABLET BY MOUTH EVERY 6 HOURS AS NEEDED FOR NAUSEA OR VOMITING (MIGRAINES) 04/08/20   Anyanwu, Jethro Bastos, MD  montelukast (SINGULAIR) 10 MG tablet Take 10 mg by mouth at bedtime as needed (for allergy relief).   10/15/18  [provider]    Family History History reviewed. No pertinent family history.  Social History Social History   Tobacco Use  . Smoking status: Never Smoker  . Smokeless tobacco: Never Used  Vaping Use  . Vaping Use: Never used  Substance Use Topics  . Alcohol use: Yes    Alcohol/week: 1.0  standard drink    Types: 1 Cans of beer per week    Comment: occasionally  . Drug use: No     Allergies   Codeine   Review of Systems Review of Systems Per HPI  Physical Exam Triage Vital Signs ED Triage Vitals  Enc Vitals Group     BP 05/04/20 1850 (!) 145/95     Pulse Rate 05/04/20 1848 (!) 103     Resp 05/04/20 1848 17     Temp 05/04/20 1850 98.9 F (37.2 C)     Temp Source 05/04/20 1850 Oral     SpO2 05/04/20 1848 100 %     Weight --      Height --      Head Circumference --      Peak Flow --      Pain Score 05/04/20 1848 5     Pain Loc --      Pain Edu? --      Excl. in GC? --    No data found.  Updated Vital Signs BP (!) 145/95 (BP Location: Right Arm)   Pulse (!) 103   Temp 98.9 F (37.2 C) (Oral)   Resp 17   LMP  (LMP Unknown)   SpO2 100%   Visual Acuity Right Eye Distance:   Left Eye Distance:   Bilateral Distance:    Right Eye Near:   Left Eye Near:    Bilateral Near:     Physical Exam Vitals and nursing note reviewed.  Constitutional:      Appearance: Normal appearance. She is well-developed. She is not ill-appearing.  HENT:     Head: Atraumatic.     Mouth/Throat:     Mouth: Mucous membranes are moist.     Pharynx: Oropharynx is clear.  Eyes:     Extraocular Movements: Extraocular movements intact.     Conjunctiva/sclera: Conjunctivae normal.  Cardiovascular:     Rate and Rhythm: Normal rate and regular rhythm.     Heart sounds: Normal heart sounds.  Pulmonary:     Effort: Pulmonary effort is normal.     Breath sounds: Normal breath sounds.  Abdominal:     General: Bowel sounds are normal. There is no distension.     Palpations: Abdomen is soft. There is no mass.     Tenderness: There is abdominal tenderness. There is no right CVA tenderness, left CVA tenderness, guarding or rebound.     Comments: Mild diffuse tenderness to palpation across abdomen  Musculoskeletal:        General: Normal range of motion.  Cervical back:  Normal range of motion and neck supple.  Skin:    General: Skin is warm and dry.     Findings: No erythema or rash.  Neurological:     Mental Status: She is alert and oriented to person, place, and time.     Sensory: No sensory deficit.  Psychiatric:        Mood and Affect: Mood normal.        Thought Content: Thought content normal.        Judgment: Judgment normal.      UC Treatments / Results  Labs (all labs ordered are listed, but only abnormal results are displayed) Labs Reviewed  POCT URINALYSIS DIPSTICK, ED / UC  POC URINE PREG, ED    EKG   Radiology No results found.  Procedures Procedures (including critical care time)  Medications Ordered in UC Medications - No data to display  Initial Impression / Assessment and Plan / UC Course  I have reviewed the triage vital signs and the nursing notes.  Pertinent labs & imaging results that were available during my care of the patient were reviewed by me and considered in my medical decision making (see chart for details).     Mildly hypertensive and tachycardic at triage, likely secondary to illness.  UA benign, urine pregnant negative.  Will treat with Zofran in addition to her home Phenergan as needed, push fluids, brat diet, rest.  Work note given.  Suspect viral illness causing her GI symptoms today.  Go to ED for acutely worsening symptoms.  Final Clinical Impressions(s) / UC Diagnoses   Final diagnoses:  Nausea vomiting and diarrhea   Discharge Instructions   None    ED Prescriptions    Medication Sig Dispense Auth. Provider   ondansetron (ZOFRAN ODT) 4 MG disintegrating tablet Take 1 tablet (4 mg total) by mouth every 8 (eight) hours as needed for nausea or vomiting. 20 tablet Particia Nearing, [place]     PDMP not reviewed this encounter.   Particia Nearing, [place] 05/04/20 2029

## 2020-06-13 ENCOUNTER — Other Ambulatory Visit: Payer: Self-pay

## 2020-06-13 ENCOUNTER — Ambulatory Visit (HOSPITAL_COMMUNITY)
Admission: EM | Admit: 2020-06-13 | Discharge: 2020-06-13 | Disposition: A | Discharge status: Home / Self Care | Payer: Self-pay | Attending: Physician Assistant | Admitting: Physician Assistant

## 2020-06-13 ENCOUNTER — Encounter (HOSPITAL_COMMUNITY): Payer: Self-pay | Admitting: *Deleted

## 2020-06-13 DIAGNOSIS — K047 Periapical abscess without sinus: Secondary | ICD-10-CM

## 2020-06-13 DIAGNOSIS — K0889 Other specified disorders of teeth and supporting structures: Secondary | ICD-10-CM

## 2020-06-13 MED ORDER — AMOXICILLIN-POT CLAVULANATE 875-125 MG PO TABS: 1.0000 | ORAL_TABLET | Freq: Two times a day (BID) | ORAL | 0 refills | Status: AC

## 2020-06-13 MED ORDER — KETOROLAC TROMETHAMINE 30 MG/ML IJ SOLN
30.0000 mg | Freq: Once | INTRAMUSCULAR | Status: AC
  Administered 2020-06-13: 30 mg via INTRAMUSCULAR

## 2020-06-13 MED ORDER — KETOROLAC TROMETHAMINE 30 MG/ML IJ SOLN
INTRAMUSCULAR | Status: AC
  Filled 2020-06-13: qty 1

## 2020-06-13 NOTE — ED Triage Notes (Signed)
PT reports 2 weeks of Lt sided dental pain.

## 2020-06-13 NOTE — ED Provider Notes (Signed)
[facility]    CSN: [id number] Arrival date & time: 06/13/20  1345      History   Chief Complaint Chief Complaint  Patient presents with  . Dental Pain    HPI Mandy Stewart is a 35 y.o. female.   Pt complains of right upper and left upper dental pain that became worse about 2 weeks ago.  She has a broken tooth on both sides.  She currently does not have insurance, reports she will have insurance in 2 months and will be establishing care with dentist after that.  Has told she needs teeth extracted in the past.  She denies fever, chills, n/v/d.  She has taken ibuprofen and tylenol with temporary relief.       Past Medical History:  Diagnosis Date  . Medical history non-contributory     Patient Active Problem List   Diagnosis Date Noted  . Thickened endometrium 06/12/2019  . 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 CHOLECYSTECTOMY N/A 12/22/2014   Procedure: LAPAROSCOPIC CHOLECYSTECTOMY;  Surgeon: Franky Macho, MD;  Location: [facility];  Service: General;  Laterality: N/A;    OB History    Gravida  3   Para      Term      Preterm      AB  3   Living  0     SAB  3   IAB      Ectopic      Multiple      Live Births               Home Medications    Prior to Admission medications   Medication Sig Start Date End Date Taking? Authorizing Provider  acetaminophen (TYLENOL) 325 MG tablet Take 2 tablets (650 mg total) by mouth every 6 (six) hours as needed for mild pain or moderate pain. 06/25/17   Georgiana Shore, PA-C  Cetirizine HCl (ZYRTEC ALLERGY) 10 MG CAPS Take 1 capsule (10 mg total) by mouth daily. 05/06/18   Ward, Layla Maw, DO  cetirizine-pseudoephedrine (ZYRTEC-D) 5-120 MG tablet Take 1 tablet by mouth 2 (two) times daily as needed for allergies. 02/23/16   Cathren Laine, MD  cyclobenzaprine (FLEXERIL) 10 MG tablet Take 1 tablet (10 mg total) by  mouth 3 (three) times daily as needed. 03/23/17   Triplett, Tammy, PA-C  doxycycline (VIBRAMYCIN) 100 MG capsule Take 1 capsule (100 mg total) by mouth 2 (two) times daily. 10/15/18   Mardella Layman, MD  esomeprazole (NEXIUM) 40 MG capsule Take 40 mg by mouth daily.    [provider]  fluticasone (FLONASE) 50 MCG/ACT nasal spray Place 2 sprays into both nostrils daily. 05/06/18   Ward, Layla Maw, DO  ibuprofen (ADVIL,MOTRIN) 800 MG tablet Take 1 tablet (800 mg total) by mouth 3 (three) times daily. 06/25/17   Georgiana Shore, PA-C  medroxyPROGESTERone (PROVERA) 10 MG tablet Take 1 tablet (10 mg total) by mouth daily. Use for ten days 06/04/19   Anyanwu, Jethro Bastos, MD  naproxen (NAPROSYN) 500 MG tablet Take 1 tablet (500 mg total) by mouth 2 (two) times daily. 06/05/19   Henderly, Britni A, PA-C  ondansetron (ZOFRAN ODT) 4 MG disintegrating tablet Take 1 tablet (4 mg total) by mouth every 8 (eight) hours as needed for nausea or vomiting. 05/04/20   Particia Nearing, PA-C  ondansetron (ZOFRAN-ODT) 4 MG disintegrating tablet Take 1 tablet (  4 mg total) by mouth every 8 (eight) hours as needed for nausea or vomiting. 10/15/18   Mardella Layman, MD  promethazine (PHENERGAN) 25 MG tablet TAKE 1 TABLET BY MOUTH EVERY 6 HOURS AS NEEDED FOR NAUSEA OR VOMITING (MIGRAINES) 04/08/20   Anyanwu, Jethro Bastos, MD  montelukast (SINGULAIR) 10 MG tablet Take 10 mg by mouth at bedtime as needed (for allergy relief).   10/15/18  [provider]    Family History History reviewed. No pertinent family history.  Social History Social History   Tobacco Use  . Smoking status: Never Smoker  . Smokeless tobacco: Never Used  Vaping Use  . Vaping Use: Never used  Substance Use Topics  . Alcohol use: Yes    Alcohol/week: 1.0 standard drink    Types: 1 Cans of beer per week    Comment: occasionally  . Drug use: No     Allergies   Codeine   Review of Systems Review of Systems  Constitutional: Negative  for chills and fever.  HENT: Positive for dental problem. Negative for ear pain and sore throat.   Eyes: Negative for pain and visual disturbance.  Respiratory: Negative for cough and shortness of breath.   Cardiovascular: Negative for chest pain and palpitations.  Gastrointestinal: Negative for abdominal pain and vomiting.  Genitourinary: Negative for dysuria and hematuria.  Musculoskeletal: Negative for arthralgias and back pain.  Skin: Negative for color change and rash.  Neurological: Negative for seizures and syncope.  All other systems reviewed and are negative.    Physical Exam Triage Vital Signs ED Triage Vitals  Enc Vitals Group     BP 06/13/20 1433 (!) 156/93     Pulse Rate 06/13/20 1433 86     Resp 06/13/20 1433 18     Temp 06/13/20 1433 98.6 F (37 C)     Temp Source 06/13/20 1433 Oral     SpO2 06/13/20 1433 96 %     Weight --      Height --      Head Circumference --      Peak Flow --      Pain Score 06/13/20 1430 7     Pain Loc --      Pain Edu? --      Excl. in GC? --    No data found.  Updated Vital Signs BP (!) 156/93 (BP Location: Right Arm)   Pulse 86   Temp 98.6 F (37 C) (Oral)   Resp 18   LMP  (LMP Unknown)   SpO2 96%   Visual Acuity Right Eye Distance:   Left Eye Distance:   Bilateral Distance:    Right Eye Near:   Left Eye Near:    Bilateral Near:     Physical Exam Vitals and nursing note reviewed.  Constitutional:      General: She is not in acute distress.    Appearance: She is well-developed.  HENT:     Head: Normocephalic and atraumatic.     Mouth/Throat:     Dentition: Abnormal dentition. Dental tenderness and dental caries present.   Eyes:     Conjunctiva/sclera: Conjunctivae normal.  Cardiovascular:     Rate and Rhythm: Normal rate and regular rhythm.     Heart sounds: No murmur heard.   Pulmonary:     Effort: Pulmonary effort is normal. No respiratory distress.     Breath sounds: Normal breath sounds.   Abdominal:     Palpations: Abdomen is soft.  Tenderness: There is no abdominal tenderness.  Musculoskeletal:     Cervical back: Neck supple.  Skin:    General: Skin is warm and dry.  Neurological:     Mental Status: She is alert.      UC Treatments / Results  Labs (all labs ordered are listed, but only abnormal results are displayed) Labs Reviewed - No data to display  EKG   Radiology No results found.  Procedures Procedures (including critical care time)  Medications Ordered in UC Medications - No data to display  Initial Impression / Assessment and Plan / UC Course  I have reviewed the triage vital signs and the nursing notes.  Pertinent labs & imaging results that were available during my care of the patient were reviewed by me and considered in my medical decision making (see chart for details).     Will treat for dental abscess, Augmentin prescribed. Toradol given in clinic today.  Information given regarding low cost dentist in the area.  Return precautions discussed.  Final Clinical Impressions(s) / UC Diagnoses   Final diagnoses:  None   Discharge Instructions   None    ED Prescriptions    None     PDMP not reviewed this encounter.   Jodell Cipro, PA-C 06/13/20 1452

## 2020-06-13 NOTE — Discharge Instructions (Addendum)
Take medication as prescribed Continue with tylenol as needed Follow up with dentist

## 2020-07-31 ENCOUNTER — Encounter (HOSPITAL_COMMUNITY): Payer: Self-pay

## 2020-07-31 ENCOUNTER — Other Ambulatory Visit: Payer: Self-pay

## 2020-07-31 ENCOUNTER — Ambulatory Visit (HOSPITAL_COMMUNITY)
Admission: EM | Admit: 2020-07-31 | Discharge: 2020-07-31 | Disposition: A | Discharge status: Home / Self Care | Payer: Self-pay | Attending: Internal Medicine | Admitting: Internal Medicine

## 2020-07-31 DIAGNOSIS — N3001 Acute cystitis with hematuria: Secondary | ICD-10-CM

## 2020-07-31 LAB — POCT URINALYSIS DIPSTICK, ED / UC
Glucose, UA: 100 mg/dL — AB
Nitrite: POSITIVE — AB
Protein, ur: 30 mg/dL — AB
Specific Gravity, Urine: >=1.03 (ref 1.005–1.030)
Urobilinogen, UA: 1 mg/dL (ref 0.0–1.0)
pH: 6 (ref 5.0–8.0)

## 2020-07-31 LAB — POC URINE PREG, ED: Preg Test, Ur: NEGATIVE

## 2020-07-31 LAB — CBG MONITORING, ED: Glucose-Capillary: 137 mg/dL — ABNORMAL HIGH (ref 70–99)

## 2020-07-31 MED ORDER — NITROFURANTOIN MONOHYD MACRO 100 MG PO CAPS: 100.0000 mg | ORAL_CAPSULE | Freq: Two times a day (BID) | ORAL | 0 refills | Status: DC

## 2020-07-31 NOTE — ED Triage Notes (Signed)
Pt presents with pain with urination, lower abdominal pain, and back pain X 5 days.

## 2020-07-31 NOTE — ED Provider Notes (Addendum)
[facility]    CSN: [id number] Arrival date & time: 07/31/20  1623      History   Chief Complaint Chief Complaint  Patient presents with  . UTI    HPI Mandy Stewart is a 35 y.o. female.   HPI  UTI: Pt presents with dysuria, lower abdominal pain and back pain for the past 5 days.  She states that initially she tried Azo which did help with symptoms but recently this has not been very helpful.  She has had mild nausea but denies any vomiting, fever or chills.  She also denies any significant abdominal or pelvic pain.  She is unsure when her last menstrual cycle was as she has a history of irregular menstrual cycles. Of note she is non-fasting   Past Medical History:  Diagnosis Date  . Medical history non-contributory     Patient Active Problem List   Diagnosis Date Noted  . Thickened endometrium 06/12/2019  . 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 CHOLECYSTECTOMY N/A 12/22/2014   Procedure: LAPAROSCOPIC CHOLECYSTECTOMY;  Surgeon: Franky Macho, MD;  Location: [facility];  Service: General;  Laterality: N/A;    OB History    Gravida  3   Para      Term      Preterm      AB  3   Living  0     SAB  3   IAB      Ectopic      Multiple      Live Births               Home Medications    Prior to Admission medications   Medication Sig Start Date End Date Taking? Authorizing Provider  acetaminophen (TYLENOL) 325 MG tablet Take 2 tablets (650 mg total) by mouth every 6 (six) hours as needed for mild pain or moderate pain. 06/25/17   Georgiana Shore, PA-C  Cetirizine HCl (ZYRTEC ALLERGY) 10 MG CAPS Take 1 capsule (10 mg total) by mouth daily. 05/06/18   Ward, Layla Maw, DO  cetirizine-pseudoephedrine (ZYRTEC-D) 5-120 MG tablet Take 1 tablet by mouth 2 (two) times daily as needed for allergies. 02/23/16   Cathren Laine, MD  cyclobenzaprine (FLEXERIL) 10 MG tablet  Take 1 tablet (10 mg total) by mouth 3 (three) times daily as needed. 03/23/17   Triplett, Tammy, PA-C  esomeprazole (NEXIUM) 40 MG capsule Take 40 mg by mouth daily.    [provider]  fluticasone (FLONASE) 50 MCG/ACT nasal spray Place 2 sprays into both nostrils daily. 05/06/18   Ward, Layla Maw, DO  ibuprofen (ADVIL,MOTRIN) 800 MG tablet Take 1 tablet (800 mg total) by mouth 3 (three) times daily. 06/25/17   Georgiana Shore, PA-C  medroxyPROGESTERone (PROVERA) 10 MG tablet Take 1 tablet (10 mg total) by mouth daily. Use for ten days 06/04/19   Anyanwu, Jethro Bastos, MD  naproxen (NAPROSYN) 500 MG tablet Take 1 tablet (500 mg total) by mouth 2 (two) times daily. 06/05/19   Henderly, Britni A, PA-C  ondansetron (ZOFRAN ODT) 4 MG disintegrating tablet Take 1 tablet (4 mg total) by mouth every 8 (eight) hours as needed for nausea or vomiting. 05/04/20   Particia Nearing, PA-C  ondansetron (ZOFRAN-ODT) 4 MG disintegrating tablet Take 1 tablet (4 mg total) by mouth every 8 (eight) hours as needed for nausea or vomiting. 10/15/18   Hagler,  Arlys John, MD  promethazine (PHENERGAN) 25 MG tablet TAKE 1 TABLET BY MOUTH EVERY 6 HOURS AS NEEDED FOR NAUSEA OR VOMITING (MIGRAINES) 04/08/20   Anyanwu, Jethro Bastos, MD  montelukast (SINGULAIR) 10 MG tablet Take 10 mg by mouth at bedtime as needed (for allergy relief).   10/15/18  [provider]    Family History History reviewed. No pertinent family history.  Social History Social History   Tobacco Use  . Smoking status: Never Smoker  . Smokeless tobacco: Never Used  Vaping Use  . Vaping Use: Never used  Substance Use Topics  . Alcohol use: Yes    Alcohol/week: 1.0 standard drink    Types: 1 Cans of beer per week    Comment: occasionally  . Drug use: No     Allergies   Codeine   Review of Systems Review of Systems  As stated above in HPI Physical Exam Triage Vital Signs ED Triage Vitals  Enc Vitals Group     BP 07/31/20 1727 138/88      Pulse Rate 07/31/20 1727 100     Resp 07/31/20 1727 17     Temp 07/31/20 1727 97.7 F (36.5 C)     Temp Source 07/31/20 1727 Oral     SpO2 07/31/20 1727 100 %     Weight --      Height --      Head Circumference --      Peak Flow --      Pain Score 07/31/20 1729 6     Pain Loc --      Pain Edu? --      Excl. in GC? --    No data found.  Updated Vital Signs BP 138/88 (BP Location: Right Arm)   Pulse 100   Temp 97.7 F (36.5 C) (Oral)   Resp 17   SpO2 100%   Visual Acuity Right Eye Distance:   Left Eye Distance:   Bilateral Distance:    Right Eye Near:   Left Eye Near:    Bilateral Near:     Physical Exam Vitals and nursing note reviewed.  Constitutional:      General: She is not in acute distress.    Appearance: Normal appearance. She is not ill-appearing, toxic-appearing or diaphoretic.  Cardiovascular:     Rate and Rhythm: Normal rate and regular rhythm.     Heart sounds: Normal heart sounds.  Pulmonary:     Effort: Pulmonary effort is normal.     Breath sounds: Normal breath sounds.  Chest:  Breasts:     Right: Axillary adenopathy present.     Left: Axillary adenopathy present.    Abdominal:     General: There is no distension.     Palpations: Abdomen is soft. There is no mass.     Tenderness: There is no abdominal tenderness. There is no right CVA tenderness, left CVA tenderness, guarding or rebound.     Hernia: No hernia is present.  Lymphadenopathy:     Upper Body:     Right upper body: Axillary adenopathy present.     Left upper body: Axillary adenopathy present.  Skin:    General: Skin is warm.  Neurological:     Mental Status: She is alert and oriented to person, place, and time.      UC Treatments / Results  Labs (all labs ordered are listed, but only abnormal results are displayed) Labs Reviewed  POCT URINALYSIS DIPSTICK, ED / UC  POC URINE PREG, ED  EKG   Radiology No results found.  Procedures Procedures (including  critical care time)  Medications Ordered in UC Medications - No data to display  Initial Impression / Assessment and Plan / UC Course  I have reviewed the triage vital signs and the nursing notes.  Pertinent labs & imaging results that were available during my care of the patient were reviewed by me and considered in my medical decision making (see chart for details).     New. Acute cystitis with hematuria.  Treating with Macrobid to help prevent further systemic complications.  Checking glucose value as well given her glucose finding on UA which may be related to her Azo use if her POC glucose is within reasonable limits considering she is non-fasting.  We discussed red flag signs and symptoms along with hydration with water.    Final Clinical Impressions(s) / UC Diagnoses   Final diagnoses:  None   Discharge Instructions   None    ED Prescriptions    None     PDMP not reviewed this encounter.   Rushie Chestnut, Cordelia Poche 07/31/20 1828    Rushie Chestnut, PA-C 07/31/20 1839    Rushie Chestnut, PA-C 07/31/20 [phone]

## 2020-10-09 ENCOUNTER — Ambulatory Visit (HOSPITAL_COMMUNITY)
Admission: EM | Admit: 2020-10-09 | Discharge: 2020-10-09 | Disposition: A | Discharge status: Home / Self Care | Payer: No Typology Code available for payment source | Attending: Student | Admitting: Student

## 2020-10-09 ENCOUNTER — Encounter (HOSPITAL_COMMUNITY): Payer: Self-pay

## 2020-10-09 ENCOUNTER — Other Ambulatory Visit: Payer: Self-pay

## 2020-10-09 DIAGNOSIS — R030 Elevated blood-pressure reading, without diagnosis of hypertension: Secondary | ICD-10-CM

## 2020-10-09 DIAGNOSIS — I498 Other specified cardiac arrhythmias: Secondary | ICD-10-CM | POA: Diagnosis not present

## 2020-10-09 DIAGNOSIS — H60391 Other infective otitis externa, right ear: Secondary | ICD-10-CM

## 2020-10-09 MED ORDER — AMOXICILLIN 875 MG PO TABS: 875.0000 mg | ORAL_TABLET | Freq: Two times a day (BID) | ORAL | 0 refills | Status: AC

## 2020-10-09 MED ORDER — OFLOXACIN 0.3 % OT SOLN: 2.0000 [drp] | Freq: Two times a day (BID) | OTIC | 0 refills | Status: AC

## 2020-10-09 NOTE — ED Triage Notes (Signed)
Pt c/o right ear pain that's been going on for about a week, states it feels its swimmers ear. Pt states she is having difficulty hearing from ear and denies dizziness.

## 2020-10-09 NOTE — ED Provider Notes (Signed)
[facility]    CSN: [id number] Arrival date & time: 10/09/20  1718      History   Chief Complaint Chief Complaint  Patient presents with   Otalgia    HPI Mandy Stewart is a 35 y.o. female presenting with R ear pain x1 week, feels like swimmer's ear. Muffled hearing. Long history swimmer's ear. Medical history otherwise noncontributory. Does endorse recent swimming.  Denies recent URI, allergic rhinitis, fever/chills, cough, sore throat.  Has tried some over-the-counter swimmer's ear drops with minimal relief.  Denies dizziness, tinnitus.  Also with "sensation of heart fluttering" intermittently for approximately 1 week. Denies triggers or instigating factors. Denies current fluttering.  Unable to quantify how long this lasts when it happens.  Denies chest pain, shortness of breath, dizziness, weakness.  Denies history of cardiac issues.  HPI  Past Medical History:  Diagnosis Date   Medical history non-contributory     Patient Active Problem List   Diagnosis Date Noted   Thickened endometrium 06/12/2019   ASCUS with positive high risk HPV cervical pap on 06/04/2019 06/04/2019    Past Surgical History:  Procedure Laterality Date   CHOLECYSTECTOMY N/A 12/22/2014   Procedure: LAPAROSCOPIC CHOLECYSTECTOMY;  Surgeon: Franky Macho, MD;  Location: [facility];  Service: General;  Laterality: N/A;    OB History     Gravida  3   Para      Term      Preterm      AB  3   Living  0      SAB  3   IAB      Ectopic      Multiple      Live Births               Home Medications    Prior to Admission medications   Medication Sig Start Date End Date Taking? Authorizing Provider  amoxicillin (AMOXIL) 875 MG tablet Take 1 tablet (875 mg total) by mouth 2 (two) times daily for 7 days. 10/09/20 10/16/20 Yes Rhys Martini, PA-C  ofloxacin (FLOXIN) 0.3 % OTIC solution Place 2 drops into the right ear 2 (two) times daily for 7 days. 10/09/20 10/16/20 Yes Rhys Martini, PA-C  acetaminophen (TYLENOL) 325 MG tablet Take 2 tablets (650 mg total) by mouth every 6 (six) hours as needed for mild pain or moderate pain. 06/25/17   Georgiana Shore, PA-C  Cetirizine HCl (ZYRTEC ALLERGY) 10 MG CAPS Take 1 capsule (10 mg total) by mouth daily. 05/06/18   Ward, Layla Maw, DO  cetirizine-pseudoephedrine (ZYRTEC-D) 5-120 MG tablet Take 1 tablet by mouth 2 (two) times daily as needed for allergies. 02/23/16   Cathren Laine, MD  cyclobenzaprine (FLEXERIL) 10 MG tablet Take 1 tablet (10 mg total) by mouth 3 (three) times daily as needed. 03/23/17   Triplett, Tammy, PA-C  esomeprazole (NEXIUM) 40 MG capsule Take 40 mg by mouth daily.    [provider]  fluticasone (FLONASE) 50 MCG/ACT nasal spray Place 2 sprays into both nostrils daily. 05/06/18   Ward, Layla Maw, DO  ibuprofen (ADVIL,MOTRIN) 800 MG tablet Take 1 tablet (800 mg total) by mouth 3 (three) times daily. 06/25/17   Georgiana Shore, PA-C  medroxyPROGESTERone (PROVERA) 10 MG tablet Take 1 tablet (10 mg total) by mouth daily. Use for ten days 06/04/19   Anyanwu, Jethro Bastos, MD  naproxen (NAPROSYN) 500 MG tablet Take 1 tablet (500 mg total) by mouth 2 (two) times daily. 06/05/19  Henderly, Britni A, PA-C  nitrofurantoin, macrocrystal-monohydrate, (MACROBID) 100 MG capsule Take 1 capsule (100 mg total) by mouth 2 (two) times daily. 07/31/20   Rushie Chestnut, PA-C  ondansetron (ZOFRAN ODT) 4 MG disintegrating tablet Take 1 tablet (4 mg total) by mouth every 8 (eight) hours as needed for nausea or vomiting. 05/04/20   Particia Nearing, PA-C  ondansetron (ZOFRAN-ODT) 4 MG disintegrating tablet Take 1 tablet (4 mg total) by mouth every 8 (eight) hours as needed for nausea or vomiting. 10/15/18   Mardella Layman, MD  promethazine (PHENERGAN) 25 MG tablet TAKE 1 TABLET BY MOUTH EVERY 6 HOURS AS NEEDED FOR NAUSEA OR VOMITING (MIGRAINES) 04/08/20   Anyanwu, Jethro Bastos, MD  montelukast (SINGULAIR) 10 MG tablet Take 10 mg by  mouth at bedtime as needed (for allergy relief).   10/15/18  [provider]    Family History History reviewed. No pertinent family history.  Social History Social History   Tobacco Use   Smoking status: Never   Smokeless tobacco: Never  Vaping Use   Vaping Use: Never used  Substance Use Topics   Alcohol use: Yes    Alcohol/week: 1.0 standard drink    Types: 1 Cans of beer per week    Comment: occasionally   Drug use: No     Allergies   Codeine   Review of Systems Review of Systems  Constitutional:  Negative for appetite change, chills and fever.  HENT:  Positive for ear pain. Negative for congestion, rhinorrhea, sinus pressure, sinus pain and sore throat.   Eyes:  Negative for redness and visual disturbance.  Respiratory:  Negative for cough, chest tightness, shortness of breath and wheezing.   Cardiovascular:  Negative for chest pain and palpitations.  Gastrointestinal:  Negative for abdominal pain, constipation, diarrhea, nausea and vomiting.  Genitourinary:  Negative for dysuria, frequency and urgency.  Musculoskeletal:  Negative for myalgias.  Neurological:  Negative for dizziness, weakness and headaches.  Psychiatric/Behavioral:  Negative for confusion.   All other systems reviewed and are negative.   Physical Exam Triage Vital Signs ED Triage Vitals  Enc Vitals Group     BP 10/09/20 1820 (!) 144/106     Pulse Rate 10/09/20 1820 80     Resp 10/09/20 1820 18     Temp 10/09/20 1820 98.4 F (36.9 C)     Temp src --      SpO2 10/09/20 1820 97 %     Weight --      Height --      Head Circumference --      Peak Flow --      Pain Score 10/09/20 1816 5     Pain Loc --      Pain Edu? --      Excl. in GC? --    No data found.  Updated Vital Signs BP (!) 144/106 (BP Location: Right Arm)   Pulse 80   Temp 98.4 F (36.9 C)   Resp 18   LMP  (LMP Unknown)   SpO2 97%   Visual Acuity Right Eye Distance:   Left Eye Distance:   Bilateral  Distance:    Right Eye Near:   Left Eye Near:    Bilateral Near:     Physical Exam Vitals reviewed.  Constitutional:      General: She is not in acute distress.    Appearance: Normal appearance. She is not ill-appearing or diaphoretic.  HENT:     Head: Normocephalic and  atraumatic.     Right Ear: Ear canal and external ear normal. Drainage, swelling and tenderness present. No middle ear effusion. There is no impacted cerumen. Tympanic membrane is erythematous. Tympanic membrane is not perforated, retracted or bulging.     Left Ear: Tympanic membrane, ear canal and external ear normal. No tenderness.  No middle ear effusion. There is no impacted cerumen. Tympanic membrane is not perforated, erythematous, retracted or bulging.     Ears:     Comments: R canal lined with exudate     Nose: Nose normal. No congestion.     Mouth/Throat:     Mouth: Mucous membranes are moist.     Pharynx: Uvula midline. No oropharyngeal exudate or posterior oropharyngeal erythema.  Eyes:     Extraocular Movements: Extraocular movements intact.     Pupils: Pupils are equal, round, and reactive to light.  Cardiovascular:     Rate and Rhythm: Normal rate and regular rhythm.     Pulses:          Radial pulses are 2+ on the right side and 2+ on the left side.     Heart sounds: Normal heart sounds.  Pulmonary:     Effort: Pulmonary effort is normal.     Breath sounds: Normal breath sounds. No decreased breath sounds, wheezing, rhonchi or rales.  Abdominal:     Palpations: Abdomen is soft.     Tenderness: There is no abdominal tenderness. There is no guarding or rebound.  Musculoskeletal:     Right lower leg: No edema.     Left lower leg: No edema.  Lymphadenopathy:     Cervical: Cervical adenopathy present.     Right cervical: Superficial cervical adenopathy present.     Left cervical: No superficial cervical adenopathy.  Skin:    General: Skin is warm.     Capillary Refill: Capillary refill takes less  than 2 seconds.  Neurological:     General: No focal deficit present.     Mental Status: She is alert and oriented to person, place, and time.  Psychiatric:        Mood and Affect: Mood normal.        Behavior: Behavior normal.        Thought Content: Thought content normal.        Judgment: Judgment normal.     UC Treatments / Results  Labs (all labs ordered are listed, but only abnormal results are displayed) Labs Reviewed - No data to display  EKG   Radiology No results found.  Procedures Procedures (including critical care time)  Medications Ordered in UC Medications - No data to display  Initial Impression / Assessment and Plan / UC Course  I have reviewed the triage vital signs and the nursing notes.  Pertinent labs & imaging results that were available during my care of the patient were reviewed by me and considered in my medical decision making (see chart for details).     This patient is a very pleasant 35 y.o. year old female presenting with R external otitis and sensation of heart fluttering. Amoxicillin, ofloxacin. States she is not pregnant or breastfeeding. EKG NSR, no prior EKG for comparison. Check BP at home. F/u with PCP. Strict ED return precautions discussed. Patient verbalizes understanding and agreement.    Final Clinical Impressions(s) / UC Diagnoses   Final diagnoses:  Infective otitis externa, right     Discharge Instructions      -Start the antibiotic-Amoxicillin, 1 pill every  12 hours for 7 days.  You can take this with food like with breakfast and dinner. -Ofloxacin drops twice daily x7 days -Keep the ear dry x7 days -Follow-up with your primary care if you continue to experience any cardiac symptoms; head to the emergency department if your heart fluttering is getting worse, or new symptoms like chest pain, shortness of breath, dizziness, weakness.      ED Prescriptions     Medication Sig Dispense Auth. Provider   amoxicillin  (AMOXIL) 875 MG tablet Take 1 tablet (875 mg total) by mouth 2 (two) times daily for 7 days. 14 tablet Rhys Martini, PA-C   ofloxacin (FLOXIN) 0.3 % OTIC solution Place 2 drops into the right ear 2 (two) times daily for 7 days. 5 mL Rhys Martini, PA-C      PDMP not reviewed this encounter.   Rhys Martini, PA-C 10/09/20 1924

## 2020-10-09 NOTE — Discharge Instructions (Addendum)
-  Start the antibiotic-Amoxicillin, 1 pill every 12 hours for 7 days.  You can take this with food like with breakfast and dinner. -Ofloxacin drops twice daily x7 days -Keep the ear dry x7 days -Follow-up with your primary care if you continue to experience any cardiac symptoms; head to the emergency department if your heart fluttering is getting worse, or new symptoms like chest pain, shortness of breath, dizziness, weakness.

## 2020-10-19 ENCOUNTER — Other Ambulatory Visit: Payer: Self-pay

## 2020-10-19 ENCOUNTER — Encounter (HOSPITAL_COMMUNITY): Payer: Self-pay

## 2020-10-19 ENCOUNTER — Ambulatory Visit (HOSPITAL_COMMUNITY)
Admission: EM | Admit: 2020-10-19 | Discharge: 2020-10-19 | Disposition: A | Discharge status: Home / Self Care | Payer: No Typology Code available for payment source | Attending: Physician Assistant | Admitting: Physician Assistant

## 2020-10-19 DIAGNOSIS — R059 Cough, unspecified: Secondary | ICD-10-CM

## 2020-10-19 DIAGNOSIS — R52 Pain, unspecified: Secondary | ICD-10-CM

## 2020-10-19 DIAGNOSIS — R509 Fever, unspecified: Secondary | ICD-10-CM

## 2020-10-19 DIAGNOSIS — J069 Acute upper respiratory infection, unspecified: Secondary | ICD-10-CM | POA: Diagnosis present

## 2020-10-19 DIAGNOSIS — U071 COVID-19: Secondary | ICD-10-CM | POA: Insufficient documentation

## 2020-10-19 MED ORDER — ACETAMINOPHEN 325 MG PO TABS
650.0000 mg | ORAL_TABLET | Freq: Once | ORAL | Status: AC
  Administered 2020-10-19: 650 mg via ORAL

## 2020-10-19 MED ORDER — PREDNISONE 20 MG PO TABS: 40.0000 mg | ORAL_TABLET | Freq: Every day | ORAL | 0 refills | Status: DC

## 2020-10-19 MED ORDER — PROMETHAZINE-DM 6.25-15 MG/5ML PO SYRP: 5.0000 mL | ORAL_SOLUTION | Freq: Two times a day (BID) | ORAL | 0 refills | Status: DC | PRN

## 2020-10-19 MED ORDER — ALBUTEROL SULFATE HFA 108 (90 BASE) MCG/ACT IN AERS
INHALATION_SPRAY | RESPIRATORY_TRACT | Status: AC
  Filled 2020-10-19: qty 6.7

## 2020-10-19 MED ORDER — ALBUTEROL SULFATE HFA 108 (90 BASE) MCG/ACT IN AERS
2.0000 | INHALATION_SPRAY | Freq: Once | RESPIRATORY_TRACT | Status: AC
  Administered 2020-10-19: 2 via RESPIRATORY_TRACT

## 2020-10-19 MED ORDER — ACETAMINOPHEN 325 MG PO TABS
ORAL_TABLET | ORAL | Status: AC
  Filled 2020-10-19: qty 2

## 2020-10-19 NOTE — ED Provider Notes (Signed)
[facility]    CSN: [id number] Arrival date & time: 10/19/20  1805      History   Chief Complaint Chief Complaint  Patient presents with   Chills   Headache   Generalized Body Aches   URI    HPI Mandy Stewart is a 35 y.o. female.   Patient presents today with a 1 day history of URI symptoms.  Reports nonproductive cough, chills, fever, headache, body aches, diarrhea, shortness of breath.  Denies any chest pain, nausea, vomiting, abdominal pain, dizziness, syncope.  She has tried over-the-counter medications including ibuprofen 800 mg approximately 2 hours ago as well as over-the-counter multisymptom medication with last dose at around 2:30 PM.  She did take an at-home COVID test believes this was negative.  She denies any known sick contacts.  She does report recently being treated for ear infection approximately 2 to 3 weeks ago and completed course of antibiotics and denies additional antibiotic since that time.  She does report some shortness of breath and reports having asthma as a child but has not required albuterol inhaler in adulthood.  She has not had COVID-19 vaccinations or flu shots.  No concern for pregnancy.   Past Medical History:  Diagnosis Date   Medical history non-contributory     Patient Active Problem List   Diagnosis Date Noted   Thickened endometrium 06/12/2019   ASCUS with positive high risk HPV cervical pap on 06/04/2019 06/04/2019    Past Surgical History:  Procedure Laterality Date   CHOLECYSTECTOMY N/A 12/22/2014   Procedure: LAPAROSCOPIC CHOLECYSTECTOMY;  Surgeon: Franky Macho, MD;  Location: [facility];  Service: General;  Laterality: N/A;    OB History     Gravida  3   Para      Term      Preterm      AB  3   Living  0      SAB  3   IAB      Ectopic      Multiple      Live Births               Home Medications    Prior to Admission medications   Medication Sig Start Date End Date Taking? Authorizing  Provider  predniSONE (DELTASONE) 20 MG tablet Take 2 tablets (40 mg total) by mouth daily. 10/19/20  Yes Tomorrow Dehaas K, PA-C  promethazine-dextromethorphan (PROMETHAZINE-DM) 6.25-15 MG/5ML syrup Take 5 mLs by mouth 2 (two) times daily as needed for cough. 10/19/20  Yes Cannie Muckle, Noberto Retort, PA-C  acetaminophen (TYLENOL) 325 MG tablet Take 2 tablets (650 mg total) by mouth every 6 (six) hours as needed for mild pain or moderate pain. 06/25/17   Georgiana Shore, PA-C  Cetirizine HCl (ZYRTEC ALLERGY) 10 MG CAPS Take 1 capsule (10 mg total) by mouth daily. 05/06/18   Ward, Layla Maw, DO  cetirizine-pseudoephedrine (ZYRTEC-D) 5-120 MG tablet Take 1 tablet by mouth 2 (two) times daily as needed for allergies. 02/23/16   Cathren Laine, MD  cyclobenzaprine (FLEXERIL) 10 MG tablet Take 1 tablet (10 mg total) by mouth 3 (three) times daily as needed. 03/23/17   Triplett, Tammy, PA-C  esomeprazole (NEXIUM) 40 MG capsule Take 40 mg by mouth daily.    [provider]  fluticasone (FLONASE) 50 MCG/ACT nasal spray Place 2 sprays into both nostrils daily. 05/06/18   Ward, Layla Maw, DO  ibuprofen (ADVIL,MOTRIN) 800 MG tablet Take 1 tablet (800 mg total) by mouth 3 (three)  times daily. 06/25/17   Georgiana Shore, PA-C  medroxyPROGESTERone (PROVERA) 10 MG tablet Take 1 tablet (10 mg total) by mouth daily. Use for ten days 06/04/19   Anyanwu, Jethro Bastos, MD  naproxen (NAPROSYN) 500 MG tablet Take 1 tablet (500 mg total) by mouth 2 (two) times daily. 06/05/19   Henderly, Britni A, PA-C  nitrofurantoin, macrocrystal-monohydrate, (MACROBID) 100 MG capsule Take 1 capsule (100 mg total) by mouth 2 (two) times daily. 07/31/20   Rushie Chestnut, PA-C  ondansetron (ZOFRAN ODT) 4 MG disintegrating tablet Take 1 tablet (4 mg total) by mouth every 8 (eight) hours as needed for nausea or vomiting. 05/04/20   Particia Nearing, PA-C  ondansetron (ZOFRAN-ODT) 4 MG disintegrating tablet Take 1 tablet (4 mg total) by mouth every 8  (eight) hours as needed for nausea or vomiting. 10/15/18   Mardella Layman, MD  promethazine (PHENERGAN) 25 MG tablet TAKE 1 TABLET BY MOUTH EVERY 6 HOURS AS NEEDED FOR NAUSEA OR VOMITING (MIGRAINES) 04/08/20   Anyanwu, Jethro Bastos, MD  montelukast (SINGULAIR) 10 MG tablet Take 10 mg by mouth at bedtime as needed (for allergy relief).   10/15/18  [provider]    Family History History reviewed. No pertinent family history.  Social History Social History   Tobacco Use   Smoking status: Never   Smokeless tobacco: Never  Vaping Use   Vaping Use: Never used  Substance Use Topics   Alcohol use: Yes    Alcohol/week: 1.0 standard drink    Types: 1 Cans of beer per week    Comment: occasionally   Drug use: No     Allergies   Codeine   Review of Systems Review of Systems  Constitutional:  Positive for activity change, fatigue and fever. Negative for appetite change.  HENT:  Positive for congestion, postnasal drip, rhinorrhea and sore throat. Negative for sinus pressure and sneezing.   Respiratory:  Positive for cough and shortness of breath.   Cardiovascular:  Negative for chest pain.  Gastrointestinal:  Positive for diarrhea. Negative for abdominal pain, nausea and vomiting.  Musculoskeletal:  Positive for arthralgias and myalgias.  Neurological:  Positive for headaches. Negative for dizziness and light-headedness.    Physical Exam Triage Vital Signs ED Triage Vitals  Enc Vitals Group     BP 10/19/20 1834 114/75     Pulse Rate 10/19/20 1834 (!) 128     Resp 10/19/20 1834 20     Temp 10/19/20 1834 (!) 102.9 F (39.4 C)     Temp Source 10/19/20 1834 Oral     SpO2 10/19/20 1834 96 %     Weight --      Height --      Head Circumference --      Peak Flow --      Pain Score 10/19/20 1833 8     Pain Loc --      Pain Edu? --      Excl. in GC? --    No data found.  Updated Vital Signs BP 124/79 (BP Location: Right Arm)   Pulse (!) 126   Temp (!) 102.4 F (39.1 C)  (Oral)   Resp 20   LMP  (LMP Unknown)   SpO2 96%   Visual Acuity Right Eye Distance:   Left Eye Distance:   Bilateral Distance:    Right Eye Near:   Left Eye Near:    Bilateral Near:     Physical Exam Vitals reviewed.  Constitutional:  General: She is awake. She is not in acute distress.    Appearance: Normal appearance. She is normal weight. She is not ill-appearing.     Comments: Very pleasant female appears stated age lying on exam room table no acute distress covered in blanket obviously uncomfortable  HENT:     Head: Normocephalic and atraumatic.     Right Ear: External ear normal.     Left Ear: External ear normal.     Nose:     Right Sinus: No maxillary sinus tenderness or frontal sinus tenderness.     Left Sinus: No maxillary sinus tenderness or frontal sinus tenderness.     Mouth/Throat:     Pharynx: Uvula midline. Posterior oropharyngeal erythema present. No oropharyngeal exudate.  Cardiovascular:     Rate and Rhythm: Regular rhythm. Tachycardia present.     Heart sounds: Normal heart sounds, S1 normal and S2 normal. No murmur heard. Pulmonary:     Effort: Pulmonary effort is normal.     Breath sounds: Normal breath sounds. No wheezing, rhonchi or rales.     Comments: Reactive cough with deep breathing resolved with in clinic albuterol. Lymphadenopathy:     Head:     Right side of head: No submental, submandibular or tonsillar adenopathy.     Left side of head: No submental, submandibular or tonsillar adenopathy.     Cervical: No cervical adenopathy.  Psychiatric:        Behavior: Behavior is cooperative.     UC Treatments / Results  Labs (all labs ordered are listed, but only abnormal results are displayed) Labs Reviewed  SARS CORONAVIRUS 2 (TAT 6-24 HRS)    EKG   Radiology No results found.  Procedures Procedures (including critical care time)  Medications Ordered in UC Medications  albuterol (VENTOLIN HFA) 108 (90 Base) MCG/ACT inhaler  2 puff (2 puffs Inhalation Given 10/19/20 1903)  acetaminophen (TYLENOL) tablet 650 mg (650 mg Oral Given 10/19/20 1904)    Initial Impression / Assessment and Plan / UC Course  I have reviewed the triage vital signs and the nursing notes.  Pertinent labs & imaging results that were available during my care of the patient were reviewed by me and considered in my medical decision making (see chart for details).      Discussed likely viral etiology given short duration of symptoms.  Patient was given an additional 650 mg of Tylenol with mild improvement of fever.  She was encouraged to alternate Tylenol ibuprofen at home to help manage pain and fever.  She was given albuterol in clinic with improvement of cough/shortness of breath symptoms.  She can use this every 4-6 hours as needed but discussed that she is requiring regular use of this medication she is here reevaluated.  COVID test is pending.  She was given work excuse note with current CDC return to work guidelines.  Recommended she use over-the-counter medications for symptom relief.  Encouraged her to rest and drink plenty of fluid.  She was prescribed Promethazine DM to be used for cough with instruction not to drive or drink alcohol while taking this as it can cause drowsiness.  She was prescribed prednisone 40 mg for 4 days with instruction to take NSAIDs with this medication due to risk of GI bleeding.  Discussed alarm symptoms that warrant emergent evaluation.  Strict return precautions given to which patient expressed understanding.  Final Clinical Impressions(s) / UC Diagnoses   Final diagnoses:  Upper respiratory tract infection, unspecified type  Cough  Body aches  Fever, unspecified     Discharge Instructions      I suspect you have COVID.  We will contact you with your COVID results if they are positive.  Please monitor your MyChart for this information.  Use albuterol as needed every 4-6 hours for shortness of breath and  cough.  I have also called in prednisone and like you to take 40 mg in the morning for 4 days.  Do not take NSAIDs including aspirin, ibuprofen/Advil, naproxen/Aleve with this medication as it can cause stomach bleeding.  I have also called in Promethazine DM for cough.  This will make you sleepy do not drive or drink alcohol while taking it.  You can use Tylenol, Mucinex, Flonase for additional symptom relief.  Make sure you are drinking plenty of fluid.  If you have any worsening symptoms you need to return for reevaluation as we discussed.     ED Prescriptions     Medication Sig Dispense Auth. Provider   predniSONE (DELTASONE) 20 MG tablet Take 2 tablets (40 mg total) by mouth daily. 8 tablet Marilee Ditommaso K, PA-C   promethazine-dextromethorphan (PROMETHAZINE-DM) 6.25-15 MG/5ML syrup Take 5 mLs by mouth 2 (two) times daily as needed for cough. 118 mL Ethaniel Garfield K, PA-C      PDMP not reviewed this encounter.   Jeani HawkingRaspet, Anaiah Mcmannis K, PA-C 10/19/20 1934

## 2020-10-19 NOTE — ED Notes (Signed)
Pt took  ibuprofen 2 hours ago and tylenol cold & sinus at about 2:30 pm

## 2020-10-19 NOTE — Discharge Instructions (Addendum)
I suspect you have COVID.  We will contact you with your COVID results if they are positive.  Please monitor your MyChart for this information.  Use albuterol as needed every 4-6 hours for shortness of breath and cough.  I have also called in prednisone and like you to take 40 mg in the morning for 4 days.  Do not take NSAIDs including aspirin, ibuprofen/Advil, naproxen/Aleve with this medication as it can cause stomach bleeding.  I have also called in Promethazine DM for cough.  This will make you sleepy do not drive or drink alcohol while taking it.  You can use Tylenol, Mucinex, Flonase for additional symptom relief.  Make sure you are drinking plenty of fluid.  If you have any worsening symptoms you need to return for reevaluation as we discussed.

## 2020-10-19 NOTE — ED Triage Notes (Signed)
Pt presents with non productive cough, congestion, chills, headache, and generalized body aches since waking up this morning.  Pt states her at home covid test she took today was negative.

## 2020-10-20 ENCOUNTER — Telehealth (HOSPITAL_COMMUNITY): Payer: Self-pay | Admitting: Emergency Medicine

## 2020-10-20 LAB — SARS CORONAVIRUS 2 (TAT 6-24 HRS): SARS Coronavirus 2: POSITIVE — AB

## 2020-10-20 MED ORDER — ONDANSETRON 4 MG PO TBDP: 4.0000 mg | ORAL_TABLET | Freq: Three times a day (TID) | ORAL | 0 refills | Status: DC | PRN

## 2020-10-20 NOTE — Telephone Encounter (Signed)
Patient COVID+, needs something for nausea, Zofran per [facility], APP

## 2020-11-20 ENCOUNTER — Ambulatory Visit: Payer: Self-pay | Admitting: Nurse Practitioner

## 2020-12-02 ENCOUNTER — Ambulatory Visit: Payer: Self-pay | Admitting: Nurse Practitioner

## 2020-12-04 ENCOUNTER — Encounter: Payer: Self-pay | Admitting: Nurse Practitioner

## 2020-12-04 ENCOUNTER — Ambulatory Visit (INDEPENDENT_AMBULATORY_CARE_PROVIDER_SITE_OTHER): Payer: No Typology Code available for payment source | Admitting: Nurse Practitioner

## 2020-12-04 ENCOUNTER — Other Ambulatory Visit: Payer: Self-pay

## 2020-12-04 VITALS — BP 155/97 | HR 89 | Ht 61.0 in | Wt 200.0 lb

## 2020-12-04 DIAGNOSIS — F339 Major depressive disorder, recurrent, unspecified: Secondary | ICD-10-CM

## 2020-12-04 DIAGNOSIS — N913 Primary oligomenorrhea: Secondary | ICD-10-CM | POA: Diagnosis not present

## 2020-12-04 DIAGNOSIS — F419 Anxiety disorder, unspecified: Secondary | ICD-10-CM | POA: Diagnosis not present

## 2020-12-04 DIAGNOSIS — I10 Essential (primary) hypertension: Secondary | ICD-10-CM | POA: Diagnosis not present

## 2020-12-04 MED ORDER — PROMETHAZINE HCL 25 MG PO TABS: ORAL_TABLET | ORAL | 2 refills | Status: DC

## 2020-12-04 MED ORDER — VALSARTAN 80 MG PO TABS: 80.0000 mg | ORAL_TABLET | Freq: Every day | ORAL | 0 refills | Status: DC

## 2020-12-04 MED ORDER — FLUOXETINE HCL 10 MG PO TABS: 10.0000 mg | ORAL_TABLET | Freq: Every day | ORAL | 0 refills | Status: DC

## 2020-12-04 NOTE — Assessment & Plan Note (Signed)
Ongoing. New diagnosis for patient. States she realized she hasn't been herself and it was time to seek help.  Will start Prozac 10mg daily.  Side effects and benefits of medication discussed with patient during visit today.  Follow up in 1 month for reevaluation.  

## 2020-12-04 NOTE — Assessment & Plan Note (Signed)
Chronic. Patient has been told that she has high blood pressure in the past.  She has never been on medication.  Will start Valsartan 80mg  daily. Side effects and benefits of medication discussed with patient during visit today.  Return to clinic in 1 month for reevaluation.

## 2020-12-04 NOTE — Progress Notes (Signed)
BP (!) 155/97   Pulse 89   Ht 5\' 1"  (1.549 m)   Wt 200 lb (90.7 kg)   LMP 03/31/2018   BMI 37.79 kg/m    Subjective:    Patient ID: Mandy Stewart, female    DOB: August 03, 1985, 35 y.o.   MRN: [id number]  HPI: Mandy Stewart is a 35 y.o. female  Chief Complaint  Patient presents with   Establish Care    Concerned about blood pressure, thyroid and glucose levels   Patient presents to clinic to establish care with new PCP.  Patient reports a history of Hypertension, Migraines.  Patient states she hasn't been diagnosed with depression or anxiety but does feel like she has them.  Patient states she would like have her thyroid checked as well as her glucose.   Patient denies a history of: Hypertension, Elevated Cholesterol, Diabetes, Thyroid problems, Depression, Anxiety, Neurological problems, and Abdominal problems.   Patient states she has had thoughts that if she went to sleep and didn't want to wake up.  She states she does not have a plan and doesn't plan to act on anything.  She lost her mom two years ago and that has been very hard.   Flowsheet Row Office Visit from 12/04/2020 in [facility]  PHQ-9 Total Score 15      GAD 7 : Generalized Anxiety Score 12/04/2020  Nervous, Anxious, on Edge 1  Control/stop worrying 1  Worry too much - different things 2  Trouble relaxing 0  Restless 0  Easily annoyed or irritable 2  Afraid - awful might happen 1  Total GAD 7 Score 7  Anxiety Difficulty Very difficult      Denies HA, CP, SOB, dizziness, palpitations, visual changes, and lower extremity swelling.   Active Ambulatory Problems    Diagnosis Date Noted   ASCUS with positive high risk HPV cervical pap on 06/04/2019 06/04/2019   Thickened endometrium 06/12/2019   Anxiety 12/04/2020   Depression, recurrent (HCC) 12/04/2020   Primary hypertension 12/04/2020   Resolved Ambulatory Problems    Diagnosis Date Noted   Acute cholecystitis 12/21/2014   Cholelithiasis  12/21/2014   Past Medical History:  Diagnosis Date   Medical history non-contributory    Past Surgical History:  Procedure Laterality Date   CHOLECYSTECTOMY N/A 12/22/2014   Procedure: LAPAROSCOPIC CHOLECYSTECTOMY;  Surgeon: 12/24/2014, MD;  Location: [facility];  Service: General;  Laterality: N/A;   Family History  Problem Relation Age of Onset   Asthma Mother    Depression Mother    Anxiety disorder Mother    Heart disease Mother    COPD Mother    Bipolar disorder Mother    Hyperlipidemia Father    Diabetes Father    Diabetes Sister      Review of Systems  Eyes:  Negative for visual disturbance.  Respiratory:  Negative for cough, chest tightness and shortness of breath.   Cardiovascular:  Negative for chest pain, palpitations and leg swelling.  Neurological:  Negative for dizziness and headaches.  Psychiatric/Behavioral:  Positive for dysphoric mood and suicidal ideas. The patient is nervous/anxious.    Per HPI unless specifically indicated above     Objective:    BP (!) 155/97   Pulse 89   Ht 5\' 1"  (1.549 m)   Wt 200 lb (90.7 kg)   LMP 03/31/2018   BMI 37.79 kg/m   Wt Readings from Last 3 Encounters:  12/04/20 200 lb (90.7 kg)  06/05/19 211 lb 11.2  oz (96 kg)  06/04/19 213 lb (96.6 kg)    Physical Exam Vitals and nursing note reviewed.  Constitutional:      General: She is not in acute distress.    Appearance: Normal appearance. She is obese. She is not ill-appearing, toxic-appearing or diaphoretic.  HENT:     Head: Normocephalic.     Right Ear: External ear normal.     Left Ear: External ear normal.     Nose: Nose normal.     Mouth/Throat:     Mouth: Mucous membranes are moist.     Pharynx: Oropharynx is clear.  Eyes:     General:        Right eye: No discharge.        Left eye: No discharge.     Extraocular Movements: Extraocular movements intact.     Conjunctiva/sclera: Conjunctivae normal.     Pupils: Pupils are equal, round, and reactive to  light.  Cardiovascular:     Rate and Rhythm: Normal rate and regular rhythm.     Heart sounds: No murmur heard. Pulmonary:     Effort: Pulmonary effort is normal. No respiratory distress.     Breath sounds: Normal breath sounds. No wheezing or rales.  Musculoskeletal:     Cervical back: Normal range of motion and neck supple.  Skin:    General: Skin is warm and dry.     Capillary Refill: Capillary refill takes less than 2 seconds.  Neurological:     General: No focal deficit present.     Mental Status: She is alert and oriented to person, place, and time. Mental status is at baseline.  Psychiatric:        Mood and Affect: Mood normal.        Behavior: Behavior normal.        Thought Content: Thought content normal.        Judgment: Judgment normal.    Results for orders placed or performed during the hospital encounter of 10/19/20  SARS CORONAVIRUS 2 (TAT 6-24 HRS) Nasopharyngeal Nasopharyngeal Swab   Specimen: Nasopharyngeal Swab  Result Value Ref Range   SARS Coronavirus 2 POSITIVE (A) NEGATIVE      Assessment & Plan:   Problem List Items Addressed This Visit       Cardiovascular and Mediastinum   Primary hypertension    Chronic. Patient has been told that she has high blood pressure in the past.  She has never been on medication.  Will start Valsartan 80mg  daily. Side effects and benefits of medication discussed with patient during visit today.  Return to clinic in 1 month for reevaluation.      Relevant Medications   valsartan (DIOVAN) 80 MG tablet     Other   Anxiety    Ongoing. New diagnosis for patient. States she realized she hasn't been herself and it was time to seek help.  Will start Prozac 10mg  daily.  Side effects and benefits of medication discussed with patient during visit today.  Follow up in 1 month for reevaluation.       Relevant Medications   FLUoxetine (PROZAC) 10 MG tablet   Other Relevant Orders   Ambulatory referral to Psychology    Depression, recurrent (HCC) - Primary    Ongoing. New diagnosis for patient. States she realized she hasn't been herself and it was time to seek help.  Will start Prozac 10mg  daily.  Side effects and benefits of medication discussed with patient during visit today.  Follow  up in 1 month for reevaluation.       Relevant Medications   FLUoxetine (PROZAC) 10 MG tablet   Other Relevant Orders   Ambulatory referral to Psychology   Other Visit Diagnoses     Primary oligomenorrhea       Relevant Medications   promethazine (PHENERGAN) 25 MG tablet        Follow up plan: Return in about 1 month (around 01/04/2021) for Physical and Fasting labs, and med follow up.

## 2020-12-04 NOTE — Assessment & Plan Note (Signed)
Ongoing. New diagnosis for patient. States she realized she hasn't been herself and it was time to seek help.  Will start Prozac 10mg  daily.  Side effects and benefits of medication discussed with patient during visit today.  Follow up in 1 month for reevaluation.

## 2020-12-26 ENCOUNTER — Other Ambulatory Visit: Payer: Self-pay | Admitting: Nurse Practitioner

## 2020-12-26 NOTE — Telephone Encounter (Signed)
Requested medication (s) are due for refill today: yes  Requested medication (s) are on the active medication list: yes  Last refill:  12/04/20 #30  Future visit scheduled: yes  Notes to clinic:  overdue lab work   Requested Prescriptions  Pending Prescriptions Disp Refills   valsartan (DIOVAN) 80 MG tablet [Pharmacy Med Name: VALSARTAN 80 MG TABLET] 30 tablet 0    Sig: TAKE 1 TABLET BY MOUTH EVERY DAY     Cardiovascular:  Angiotensin Receptor Blockers Failed - 12/26/2020 11:01 AM      Failed - Cr in normal range and within 180 days    Creatinine, Ser  Date Value Ref Range Status  06/25/2017 0.72 0.44 - 1.00 mg/dL Final          Failed - K in normal range and within 180 days    Potassium  Date Value Ref Range Status  06/25/2017 3.4 (L) 3.5 - 5.1 mmol/L Final          Failed - Last BP in normal range    BP Readings from Last 1 Encounters:  12/04/20 (!) 155/97          Passed - Patient is not pregnant      Passed - Valid encounter within last 6 months    Recent Outpatient Visits           3 weeks ago Depression, recurrent (HCC)   [facility] Larae Grooms, NP       Future Appointments             In 1 week Larae Grooms, NP [facility]

## 2020-12-30 ENCOUNTER — Other Ambulatory Visit: Payer: Self-pay | Admitting: Nurse Practitioner

## 2020-12-30 NOTE — Telephone Encounter (Signed)
Requested Prescriptions  Pending Prescriptions Disp Refills  . FLUoxetine (PROZAC) 10 MG tablet [Pharmacy Med Name: FLUOXETINE HCL 10 MG TABLET] 30 tablet 0    Sig: TAKE 1 TABLET BY MOUTH EVERY DAY     Psychiatry:  Antidepressants - SSRI Passed - 12/30/2020  1:25 AM      Passed - Completed PHQ-2 or PHQ-9 in the last 360 days      Passed - Valid encounter within last 6 months    Recent Outpatient Visits          3 weeks ago Depression, recurrent (HCC)   [facility] Larae Grooms, NP      Future Appointments            In 2 days McElwee, Jake Church, NP [facility], [facility]

## 2020-12-30 NOTE — Progress Notes (Signed)
BP 120/78 (BP Location: Left Arm, Patient Position: Sitting)   Pulse 71   Temp 98.6 F (37 C) (Oral)   Ht 5' 0.98" (1.549 m)   Wt 198 lb 9.6 oz (90.1 kg)   LMP  (LMP Unknown)   SpO2 97%   BMI 37.54 kg/m    Subjective:    Patient ID: Mandy Stewart, female    DOB: [birth year], 35 y.o.   MRN: [id number]  CC: Chief Complaint  Patient presents with   Annual Exam    HPI: Mandy Stewart is a 35 y.o. female presenting on 01/01/2021 for comprehensive medical examination. Current medical complaints include: follow up on hypertension, anxiety, and depression  HYPERTENSION  Hypertension status: better  Satisfied with current treatment? yes Duration of hypertension: months BP monitoring frequency:  a few times a week BP range: 130s/80s BP medication side effects:  no Medication compliance: excellent compliance Previous BP meds: Valsartan Aspirin: no Recurrent headaches: no Visual changes: no Palpitations: no Dyspnea: no Chest pain: no Lower extremity edema: no Dizzy/lightheaded: no  DEPRESSION/ANXIETY  Started fluoxetine 6 weeks ago. Symptoms improved, but still having some anxiety. She denies any side effects to the medications. She would like to increase the fluoxetine dose.   She currently lives with: nieces Menopausal Symptoms: no  Depression Screen done today and results listed below:  Depression screen [facility] 2/9 01/01/2021 12/04/2020  Decreased Interest 2 1  Down, Depressed, Hopeless 2 2  PHQ - 2 Score 4 3  Altered sleeping 0 2  Tired, decreased energy 1 2  Change in appetite 1 2  Feeling bad or failure about yourself  0 3  Trouble concentrating 0 1  Moving slowly or fidgety/restless 0 1  Suicidal thoughts 0 1  PHQ-9 Score 6 15   GAD 7 : Generalized Anxiety Score 01/01/2021 12/04/2020  Nervous, Anxious, on Edge 2 1  Control/stop worrying 2 1  Worry too much - different things 2 2  Trouble relaxing 1 0  Restless 0 0  Easily annoyed or irritable 2 2  Afraid - awful  might happen 3 1  Total GAD 7 Score 12 7  Anxiety Difficulty Somewhat difficult Very difficult    The patient does not have a history of falls. I did not complete a risk assessment for falls. A plan of care for falls was not documented.   Past Medical History:  Past Medical History:  Diagnosis Date   Medical history non-contributory     Surgical History:  Past Surgical History:  Procedure Laterality Date   CHOLECYSTECTOMY N/A 12/22/2014   Procedure: LAPAROSCOPIC CHOLECYSTECTOMY;  Surgeon: Franky Macho, MD;  Location: [facility];  Service: General;  Laterality: N/A;    Medications:  Current Outpatient Medications on File Prior to Visit  Medication Sig   acetaminophen (TYLENOL) 325 MG tablet Take 2 tablets (650 mg total) by mouth every 6 (six) hours as needed for mild pain or moderate pain.   Cetirizine HCl (ZYRTEC ALLERGY) 10 MG CAPS Take 1 capsule (10 mg total) by mouth daily.   cetirizine-pseudoephedrine (ZYRTEC-D) 5-120 MG tablet Take 1 tablet by mouth 2 (two) times daily as needed for allergies.   fluticasone (FLONASE) 50 MCG/ACT nasal spray Place 2 sprays into both nostrils daily.   ibuprofen (ADVIL,MOTRIN) 800 MG tablet Take 1 tablet (800 mg total) by mouth 3 (three) times daily.   naproxen (NAPROSYN) 500 MG tablet Take 1 tablet (500 mg total) by mouth 2 (two) times daily.   promethazine (PHENERGAN) 25 MG  tablet TAKE 1 TABLET BY MOUTH EVERY 6 HOURS AS NEEDED FOR NAUSEA OR VOMITING (MIGRAINES)   valsartan (DIOVAN) 80 MG tablet TAKE 1 TABLET BY MOUTH EVERY DAY   [DISCONTINUED] montelukast (SINGULAIR) 10 MG tablet Take 10 mg by mouth at bedtime as needed (for allergy relief).    No current facility-administered medications on file prior to visit.    Allergies:  Allergies  Allergen Reactions   Codeine Hives and Nausea And Vomiting    Social History:  Social History   Socioeconomic History   Marital status: Married    Spouse name: Not on file   Number of children: Not on  file   Years of education: Not on file   Highest education level: Not on file  Occupational History   Not on file  Tobacco Use   Smoking status: Never   Smokeless tobacco: Never  Vaping Use   Vaping Use: Never used  Substance and Sexual Activity   Alcohol use: Yes    Alcohol/week: 1.0 standard drink    Types: 1 Cans of beer per week    Comment: occasionally   Drug use: No   Sexual activity: Yes    Birth control/protection: None, Condom  Other Topics Concern   Not on file  Social History Narrative   Not on file   Social Determinants of Health   Financial Resource Strain: Not on file  Food Insecurity: Not on file  Transportation Needs: Not on file  Physical Activity: Not on file  Stress: Not on file  Social Connections: Not on file  Intimate Partner Violence: Not on file   Social History   Tobacco Use  Smoking Status Never  Smokeless Tobacco Never   Social History   Substance and Sexual Activity  Alcohol Use Yes   Alcohol/week: 1.0 standard drink   Types: 1 Cans of beer per week   Comment: occasionally    Family History:  Family History  Problem Relation Age of Onset   Asthma Mother    Depression Mother    Anxiety disorder Mother    Heart disease Mother    COPD Mother    Bipolar disorder Mother    Hyperlipidemia Father    Diabetes Father    Diabetes Sister     Past medical history, surgical history, medications, allergies, family history and social history reviewed with patient today and changes made to appropriate areas of the chart.   Review of Systems  Constitutional:  Positive for malaise/fatigue.  HENT:  Positive for ear pain.   Eyes: Negative.   Respiratory: Negative.    Cardiovascular: Negative.   Gastrointestinal: Negative.   Genitourinary:  Negative for dysuria and hematuria.       Irregular menstrual cycles  Musculoskeletal: Negative.   Skin: Negative.   Neurological: Negative.   Psychiatric/Behavioral:  The patient is  nervous/anxious.   All other ROS negative except what is listed above and in the HPI.      Objective:    BP 120/78 (BP Location: Left Arm, Patient Position: Sitting)   Pulse 71   Temp 98.6 F (37 C) (Oral)   Ht 5' 0.98" (1.549 m)   Wt 198 lb 9.6 oz (90.1 kg)   LMP  (LMP Unknown)   SpO2 97%   BMI 37.54 kg/m   Wt Readings from Last 3 Encounters:  01/01/21 198 lb 9.6 oz (90.1 kg)  12/04/20 200 lb (90.7 kg)  06/05/19 211 lb 11.2 oz (96 kg)    Physical Exam Vitals  and nursing note reviewed.  Constitutional:      General: She is not in acute distress.    Appearance: Normal appearance.  HENT:     Head: Normocephalic and atraumatic.     Right Ear: Tympanic membrane, ear canal and external ear normal.     Left Ear: Tympanic membrane, ear canal and external ear normal.     Nose: Nose normal.     Mouth/Throat:     Mouth: Mucous membranes are moist.     Pharynx: Oropharynx is clear.  Eyes:     Conjunctiva/sclera: Conjunctivae normal.  Cardiovascular:     Rate and Rhythm: Normal rate and regular rhythm.     Pulses: Normal pulses.     Heart sounds: Normal heart sounds.  Pulmonary:     Effort: Pulmonary effort is normal.     Breath sounds: Normal breath sounds.  Abdominal:     General: Bowel sounds are normal.     Palpations: Abdomen is soft.     Tenderness: There is no abdominal tenderness.  Musculoskeletal:        General: Normal range of motion.     Cervical back: Normal range of motion.     Right lower leg: No edema.     Left lower leg: No edema.     Comments: Strength 5/5 in bilateral upper and lower extremities   Skin:    General: Skin is warm and dry.  Neurological:     General: No focal deficit present.     Mental Status: She is alert and oriented to person, place, and time.     Cranial Nerves: No cranial nerve deficit.     Coordination: Coordination normal.     Gait: Gait normal.  Psychiatric:        Mood and Affect: Mood normal.        Behavior: Behavior  normal.        Thought Content: Thought content normal.        Judgment: Judgment normal.    Results for orders placed or performed during the hospital encounter of 10/19/20  SARS CORONAVIRUS 2 (TAT 6-24 HRS) Nasopharyngeal Nasopharyngeal Swab   Specimen: Nasopharyngeal Swab  Result Value Ref Range   SARS Coronavirus 2 POSITIVE (A) NEGATIVE      Assessment & Plan:   Problem List Items Addressed This Visit       Cardiovascular and Mediastinum   Primary hypertension    Blood pressure controlled with starting valsartan 80mg  daily. Will continue this medication. Check CMP, CBC today. Follow up in 4-6 weeks.       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 Recommended repeat pap with HPV in 1 year. Pap done today.       Relevant Orders   Cytology - PAP   Thickened endometrium    07/16/19 had endometrial biopsy showed benign polyp. She states that she would like her cycles to be regulated. She has taken the pill in the past and would like to re-start this today. Prescription for loesterin FE sent to the pharmacy. Continue to monitor blood pressure at home. Follow up in 4-6 weeks.       Anxiety    Ongoing, improving with starting prozac, but feels like it could be better. Will increase  prozac to 20mg  daily. Currently denies SI/HI. Follow up in 4-6 weeks.       Relevant Medications   FLUoxetine (PROZAC) 20 MG capsule   Depression, recurrent ([place])    Ongoing, improving with starting prozac, but feels like it could be better. Will increase prozac to 20mg  daily. Currently denies SI/HI. Follow up in 4-6 weeks.       Relevant Medications   FLUoxetine (PROZAC) 20 MG capsule   Other Visit Diagnoses     Routine general medical examination at a health care facility    -  Primary   Health maintenance  reviewed and updated. Pap today. Check CMP, CBC, TSH. Declined flu vaccine.    Relevant Orders   CBC with Differential/Platelet   Comprehensive metabolic panel   TSH   Cytology - PAP   Encounter for lipid screening for cardiovascular disease       Lipid panel today   Relevant Orders   Lipid Panel w/o Chol/HDL Ratio   Encounter for hepatitis C screening test for low risk patient       Hepatitis C screen today   Relevant Orders   Hepatitis C antibody   Elevated fasting blood sugar       Will check A1C and treat based on results.    Relevant Orders   Bayer DCA Hb A1c Waived        Follow up plan: Return in about 4 weeks (around 01/29/2021) for 4-6 weeks for anxiety.   LABORATORY TESTING:  - Pap smear: pap done  IMMUNIZATIONS:   - Tdap: Tetanus vaccination status reviewed: last tetanus booster within 10 years. (2 years ago per patient) - Influenza: Refused - Pneumovax: Not applicable - Prevnar: Not applicable - HPV: Not applicable - Zostavax vaccine: Not applicable  SCREENING: -Mammogram: Not applicable  - Colonoscopy: Not applicable  - Bone Density: Not applicable  -Hearing Test: Not applicable  -Spirometry: Not applicable   PATIENT COUNSELING:   Advised to take 1 mg of folate supplement per day if capable of pregnancy.   Sexuality: Discussed sexually transmitted diseases, partner selection, use of condoms, avoidance of unintended pregnancy  and contraceptive alternatives.   Advised to avoid cigarette smoking.  I discussed with the patient that most people either abstain from alcohol or drink within safe limits (<=14/week and <=4 drinks/occasion for males, <=7/weeks and <= 3 drinks/occasion for females) and that the risk for alcohol disorders and other health effects rises proportionally with the number of drinks per week and how often a drinker exceeds daily limits.  Discussed cessation/primary prevention of drug use and availability of treatment for abuse.   Diet:  Encouraged to adjust caloric intake to maintain  or achieve ideal body weight, to reduce intake of dietary saturated fat and total fat, to limit sodium intake by avoiding high sodium foods and not adding table salt, and to maintain adequate dietary potassium and calcium preferably from fresh fruits, vegetables, and low-fat dairy products.    stressed the importance of regular exercise  Injury prevention: Discussed safety belts, safety helmets, smoke detector, smoking near bedding or upholstery.   Dental health: Discussed importance of regular tooth brushing, flossing, and dental visits.    NEXT PREVENTATIVE PHYSICAL DUE IN 1 YEAR. Return in about 4 weeks (around 01/29/2021) for 4-6 weeks for anxiety.

## 2021-01-01 ENCOUNTER — Other Ambulatory Visit (HOSPITAL_COMMUNITY)
Admission: RE | Admit: 2021-01-01 | Discharge: 2021-01-01 | Disposition: A | Discharge status: Home / Self Care | Payer: No Typology Code available for payment source | Source: Ambulatory Visit | Attending: Nurse Practitioner | Admitting: Nurse Practitioner

## 2021-01-01 ENCOUNTER — Other Ambulatory Visit: Payer: Self-pay

## 2021-01-01 ENCOUNTER — Ambulatory Visit (INDEPENDENT_AMBULATORY_CARE_PROVIDER_SITE_OTHER): Payer: No Typology Code available for payment source | Admitting: Nurse Practitioner

## 2021-01-01 ENCOUNTER — Encounter: Payer: Self-pay | Admitting: Nurse Practitioner

## 2021-01-01 VITALS — BP 120/78 | HR 71 | Temp 98.6°F | Ht 60.98 in | Wt 198.6 lb

## 2021-01-01 DIAGNOSIS — R9389 Abnormal findings on diagnostic imaging of other specified body structures: Secondary | ICD-10-CM

## 2021-01-01 DIAGNOSIS — Z0000 Encounter for general adult medical examination without abnormal findings: Secondary | ICD-10-CM | POA: Insufficient documentation

## 2021-01-01 DIAGNOSIS — Z13220 Encounter for screening for lipoid disorders: Secondary | ICD-10-CM

## 2021-01-01 DIAGNOSIS — F339 Major depressive disorder, recurrent, unspecified: Secondary | ICD-10-CM | POA: Diagnosis not present

## 2021-01-01 DIAGNOSIS — Z1159 Encounter for screening for other viral diseases: Secondary | ICD-10-CM | POA: Diagnosis not present

## 2021-01-01 DIAGNOSIS — R87810 Cervical high risk human papillomavirus (HPV) DNA test positive: Secondary | ICD-10-CM | POA: Diagnosis present

## 2021-01-01 DIAGNOSIS — R87610 Atypical squamous cells of undetermined significance on cytologic smear of cervix (ASC-US): Secondary | ICD-10-CM | POA: Diagnosis present

## 2021-01-01 DIAGNOSIS — I10 Essential (primary) hypertension: Secondary | ICD-10-CM

## 2021-01-01 DIAGNOSIS — Z136 Encounter for screening for cardiovascular disorders: Secondary | ICD-10-CM

## 2021-01-01 DIAGNOSIS — Z114 Encounter for screening for human immunodeficiency virus [HIV]: Secondary | ICD-10-CM

## 2021-01-01 DIAGNOSIS — R7301 Impaired fasting glucose: Secondary | ICD-10-CM

## 2021-01-01 DIAGNOSIS — F419 Anxiety disorder, unspecified: Secondary | ICD-10-CM

## 2021-01-01 LAB — BAYER DCA HB A1C WAIVED: HB A1C (BAYER DCA - WAIVED): 6.1 % — ABNORMAL HIGH (ref 4.8–5.6)

## 2021-01-01 MED ORDER — FLUOXETINE HCL 20 MG PO CAPS: 20.0000 mg | ORAL_CAPSULE | Freq: Every day | ORAL | 0 refills | Status: DC

## 2021-01-01 MED ORDER — NORETHIN ACE-ETH ESTRAD-FE 1-20 MG-MCG(24) PO TABS: 1.0000 | ORAL_TABLET | Freq: Every day | ORAL | 11 refills | Status: DC

## 2021-01-01 NOTE — Assessment & Plan Note (Signed)
Ongoing, improving with starting prozac, but feels like it could be better. Will increase prozac to 20mg  daily. Currently denies SI/HI. Follow up in 4-6 weeks.

## 2021-01-01 NOTE — Assessment & Plan Note (Signed)
Reviewed GYN notes, had colposcopy on 07/16/19 which showed CIN I. Recommended repeat pap with HPV in 1 year. Pap done today.

## 2021-01-01 NOTE — Assessment & Plan Note (Signed)
07/16/19 had endometrial biopsy showed benign polyp. She states that she would like her cycles to be regulated. She has taken the pill in the past and would like to re-start this today. Prescription for loesterin FE sent to the pharmacy. Continue to monitor blood pressure at home. Follow up in 4-6 weeks.

## 2021-01-01 NOTE — Assessment & Plan Note (Signed)
Blood pressure controlled with starting valsartan 80mg  daily. Will continue this medication. Check CMP, CBC today. Follow up in 4-6 weeks.

## 2021-01-01 NOTE — Patient Instructions (Signed)
Cotton with a few drops of mineral oil and let sit in your ears for 20 minutes, once a week

## 2021-01-02 LAB — CBC WITH DIFFERENTIAL/PLATELET
Basophils Absolute: 0 10*3/uL (ref 0.0–0.2)
Basos: 0 %
EOS (ABSOLUTE): 0.1 10*3/uL (ref 0.0–0.4)
Eos: 1 %
Hematocrit: 42.9 % (ref 34.0–46.6)
Hemoglobin: 14.2 g/dL (ref 11.1–15.9)
Immature Grans (Abs): 0 10*3/uL (ref 0.0–0.1)
Immature Granulocytes: 0 %
Lymphocytes Absolute: 1.9 10*3/uL (ref 0.7–3.1)
Lymphs: 41 %
MCH: 29.6 pg (ref 26.6–33.0)
MCHC: 33.1 g/dL (ref 31.5–35.7)
MCV: 90 fL (ref 79–97)
Monocytes Absolute: 0.4 10*3/uL (ref 0.1–0.9)
Monocytes: 9 %
Neutrophils Absolute: 2.2 10*3/uL (ref 1.4–7.0)
Neutrophils: 49 %
Platelets: 254 10*3/uL (ref 150–450)
RBC: 4.79 x10E6/uL (ref 3.77–5.28)
RDW: 13.2 % (ref 11.7–15.4)
WBC: 4.6 10*3/uL (ref 3.4–10.8)

## 2021-01-02 LAB — COMPREHENSIVE METABOLIC PANEL
ALT: 22 IU/L (ref 0–32)
AST: 21 IU/L (ref 0–40)
Albumin/Globulin Ratio: 1.6 (ref 1.2–2.2)
Albumin: 4.4 g/dL (ref 3.8–4.8)
Alkaline Phosphatase: 68 IU/L (ref 44–121)
BUN/Creatinine Ratio: 19 (ref 9–23)
BUN: 13 mg/dL (ref 6–20)
Bilirubin Total: 0.3 mg/dL (ref 0.0–1.2)
CO2: 24 mmol/L (ref 20–29)
Calcium: 9.6 mg/dL (ref 8.7–10.2)
Chloride: 104 mmol/L (ref 96–106)
Creatinine, Ser: 0.7 mg/dL (ref 0.57–1.00)
Globulin, Total: 2.8 g/dL (ref 1.5–4.5)
Glucose: 109 mg/dL — ABNORMAL HIGH (ref 70–99)
Potassium: 4.1 mmol/L (ref 3.5–5.2)
Sodium: 140 mmol/L (ref 134–144)
Total Protein: 7.2 g/dL (ref 6.0–8.5)
eGFR: 116 mL/min/{1.73_m2} (ref >59)

## 2021-01-02 LAB — HEPATITIS C ANTIBODY: Hep C Virus Ab: <0.1 s/co ratio (ref 0.0–0.9)

## 2021-01-02 LAB — TSH: TSH: 1.07 u[IU]/mL (ref 0.450–4.500)

## 2021-01-02 LAB — LIPID PANEL W/O CHOL/HDL RATIO
Cholesterol, Total: 184 mg/dL (ref 100–199)
HDL: 47 mg/dL (ref >39)
LDL Chol Calc (NIH): 106 mg/dL — ABNORMAL HIGH (ref 0–99)
Triglycerides: 179 mg/dL — ABNORMAL HIGH (ref 0–149)
VLDL Cholesterol Cal: 31 mg/dL (ref 5–40)

## 2021-01-02 MED ORDER — METFORMIN HCL 500 MG PO TABS: 500.0000 mg | ORAL_TABLET | Freq: Two times a day (BID) | ORAL | 1 refills | Status: DC

## 2021-01-02 NOTE — Addendum Note (Signed)
Addended by: Rodman Pickle A on: 01/02/2021 07:28 PM   Modules accepted: Orders

## 2021-01-08 ENCOUNTER — Encounter: Payer: No Typology Code available for payment source | Admitting: Nurse Practitioner

## 2021-01-08 LAB — CYTOLOGY - PAP
Adequacy: ABSENT
Comment: NEGATIVE
Diagnosis: UNDETERMINED — AB
High risk HPV: NEGATIVE

## 2021-01-18 ENCOUNTER — Encounter: Payer: Self-pay | Admitting: Nurse Practitioner

## 2021-01-23 ENCOUNTER — Other Ambulatory Visit: Payer: Self-pay | Admitting: Nurse Practitioner

## 2021-01-24 NOTE — Telephone Encounter (Signed)
Requested Prescriptions  Pending Prescriptions Disp Refills  . valsartan (DIOVAN) 80 MG tablet [Pharmacy Med Name: VALSARTAN 80 MG TABLET] 30 tablet 0    Sig: TAKE 1 TABLET BY MOUTH EVERY DAY     Cardiovascular:  Angiotensin Receptor Blockers Passed - 01/23/2021  5:57 PM      Passed - Cr in normal range and within 180 days    Creatinine, Ser  Date Value Ref Range Status  01/01/2021 0.70 0.57 - 1.00 mg/dL Final         Passed - K in normal range and within 180 days    Potassium  Date Value Ref Range Status  01/01/2021 4.1 3.5 - 5.2 mmol/L Final         Passed - Patient is not pregnant      Passed - Last BP in normal range    BP Readings from Last 1 Encounters:  01/01/21 120/78         Passed - Valid encounter within last 6 months    Recent Outpatient Visits          3 weeks ago Routine general medical examination at a health care facility   [facility], Lauren A, NP   1 month ago Depression, recurrent (HCC)   [facility] Larae Grooms, NP      Future Appointments            In 2 weeks Larae Grooms, NP [facility]

## 2021-02-08 ENCOUNTER — Ambulatory Visit (INDEPENDENT_AMBULATORY_CARE_PROVIDER_SITE_OTHER): Payer: No Typology Code available for payment source | Admitting: Nurse Practitioner

## 2021-02-08 ENCOUNTER — Other Ambulatory Visit: Payer: Self-pay

## 2021-02-08 ENCOUNTER — Encounter: Payer: Self-pay | Admitting: Nurse Practitioner

## 2021-02-08 VITALS — BP 119/79 | HR 89 | Temp 99.0°F | Wt 190.6 lb

## 2021-02-08 DIAGNOSIS — F339 Major depressive disorder, recurrent, unspecified: Secondary | ICD-10-CM

## 2021-02-08 DIAGNOSIS — R300 Dysuria: Secondary | ICD-10-CM | POA: Diagnosis not present

## 2021-02-08 DIAGNOSIS — N3001 Acute cystitis with hematuria: Secondary | ICD-10-CM

## 2021-02-08 MED ORDER — METRONIDAZOLE 500 MG PO TABS: 500.0000 mg | ORAL_TABLET | Freq: Two times a day (BID) | ORAL | 0 refills | Status: AC

## 2021-02-08 MED ORDER — TRAZODONE HCL 50 MG PO TABS: 25.0000 mg | ORAL_TABLET | Freq: Every evening | ORAL | 0 refills | Status: DC | PRN

## 2021-02-08 MED ORDER — LORAZEPAM 0.5 MG PO TABS: 0.5000 mg | ORAL_TABLET | Freq: Two times a day (BID) | ORAL | 0 refills | Status: DC | PRN

## 2021-02-08 MED ORDER — NITROFURANTOIN MONOHYD MACRO 100 MG PO CAPS: 100.0000 mg | ORAL_CAPSULE | Freq: Two times a day (BID) | ORAL | 0 refills | Status: DC

## 2021-02-08 NOTE — Progress Notes (Addendum)
BP 119/79   Pulse 89   Temp 99 F (37.2 C) (Oral)   Wt 190 lb 9.6 oz (86.5 kg)   SpO2 98%   BMI 36.03 kg/m    Subjective:    Patient ID: Mandy Stewart, female    DOB: Mar 01, 1985, 35 y.o.   MRN: [id number]  HPI: Mandy Stewart is a 35 y.o. female  Chief Complaint  Patient presents with   Urinary Tract Infection   URINARY SYMPTOMS Dysuria: yes Urinary frequency: yes Urgency: yes Small volume voids: yes Symptom severity: no Urinary incontinence: no Foul odor:  smells different Hematuria: no Abdominal pain: no Back pain: no Suprapubic pain/pressure: yes Flank pain: no Fever:  no Vomiting: yes- but due to different situation Relief with cranberry juice: no Relief with pyridium: yes Status: stable Previous urinary tract infection: no Recurrent urinary tract infection: no Sexual activity: No sexually active/monogomous/practicing safe sex History of sexually transmitted disease: no  DEPRESSION/ANXIETY Patient states she was doing better then this past week.  She states the Prozac was increased at the last visit which really helped her.  Patient states she is having family problems which has caused her to depression exacerbated.  States she is also having panic attacks. She has an appt with a grief counselor on December 22. Denies SI.  [place] Office Visit from 02/08/2021 in [place]  PHQ-9 Total Score 20      GAD 7 : Generalized Anxiety Score 02/08/2021 01/01/2021 12/04/2020  Nervous, Anxious, on Edge 2 2 1   Control/stop worrying 2 2 1   Worry too much - different things 1 2 2   Trouble relaxing 1 1 0  Restless 1 0 0  Easily annoyed or irritable 1 2 2   Afraid - awful might happen 2 3 1   Total GAD 7 Score 10 12 7   Anxiety Difficulty Somewhat difficult Somewhat difficult Very difficult     Relevant past medical, surgical, family and social history reviewed and updated as indicated. Interim medical history since our last visit reviewed. Allergies  and medications reviewed and updated.  Review of Systems  Constitutional:  Negative for fever.  Gastrointestinal:  Positive for abdominal pain. Negative for vomiting.  Genitourinary:  Positive for decreased urine volume, dysuria, frequency and urgency. Negative for flank pain and hematuria.  Musculoskeletal:  Negative for back pain.  Psychiatric/Behavioral:  Positive for dysphoric mood. Negative for suicidal ideas. The patient is nervous/anxious.    Per HPI unless specifically indicated above     Objective:    BP 119/79   Pulse 89   Temp 99 F (37.2 C) (Oral)   Wt 190 lb 9.6 oz (86.5 kg)   SpO2 98%   BMI 36.03 kg/m   Wt Readings from Last 3 Encounters:  02/08/21 190 lb 9.6 oz (86.5 kg)  01/01/21 198 lb 9.6 oz (90.1 kg)  12/04/20 200 lb (90.7 kg)    Physical Exam Vitals and nursing note reviewed.  Constitutional:      General: She is not in acute distress.    Appearance: Normal appearance. She is normal weight. She is not ill-appearing, toxic-appearing or diaphoretic.  HENT:     Head: Normocephalic.     Right Ear: External ear normal.     Left Ear: External ear normal.     Nose: Nose normal.     Mouth/Throat:     Mouth: Mucous membranes are moist.     Pharynx: Oropharynx is clear.  Eyes:     General:  Right eye: No discharge.        Left eye: No discharge.     Extraocular Movements: Extraocular movements intact.     Conjunctiva/sclera: Conjunctivae normal.     Pupils: Pupils are equal, round, and reactive to light.  Cardiovascular:     Rate and Rhythm: Normal rate and regular rhythm.     Heart sounds: No murmur heard. Pulmonary:     Effort: Pulmonary effort is normal. No respiratory distress.     Breath sounds: Normal breath sounds. No wheezing or rales.  Abdominal:     General: Abdomen is flat. Bowel sounds are normal. There is no distension.     Palpations: Abdomen is soft.     Tenderness: There is abdominal tenderness. There is no right CVA tenderness,  left CVA tenderness or guarding.  Musculoskeletal:     Cervical back: Normal range of motion and neck supple.  Skin:    General: Skin is warm and dry.     Capillary Refill: Capillary refill takes less than 2 seconds.  Neurological:     General: No focal deficit present.     Mental Status: She is alert and oriented to person, place, and time. Mental status is at baseline.  Psychiatric:        Mood and Affect: Mood normal.        Behavior: Behavior normal.        Thought Content: Thought content normal.        Judgment: Judgment normal.    Results for orders placed or performed in visit on 01/01/21  CBC with Differential/Platelet  Result Value Ref Range   WBC 4.6 3.4 - 10.8 x10E3/uL   RBC 4.79 3.77 - 5.28 x10E6/uL   Hemoglobin 14.2 11.1 - 15.9 g/dL   Hematocrit 42.9 34.0 - 46.6 %   MCV 90 79 - 97 fL   MCH 29.6 26.6 - 33.0 pg   MCHC 33.1 31.5 - 35.7 g/dL   RDW 13.2 11.7 - 15.4 %   Platelets 254 150 - 450 x10E3/uL   Neutrophils 49 Not Estab. %   Lymphs 41 Not Estab. %   Monocytes 9 Not Estab. %   Eos 1 Not Estab. %   Basos 0 Not Estab. %   Neutrophils Absolute 2.2 1.4 - 7.0 x10E3/uL   Lymphocytes Absolute 1.9 0.7 - 3.1 x10E3/uL   Monocytes Absolute 0.4 0.1 - 0.9 x10E3/uL   EOS (ABSOLUTE) 0.1 0.0 - 0.4 x10E3/uL   Basophils Absolute 0.0 0.0 - 0.2 x10E3/uL   Immature Granulocytes 0 Not Estab. %   Immature Grans (Abs) 0.0 0.0 - 0.1 x10E3/uL  Comprehensive metabolic panel  Result Value Ref Range   Glucose 109 (H) 70 - 99 mg/dL   BUN 13 6 - 20 mg/dL   Creatinine, Ser 0.70 0.57 - 1.00 mg/dL   eGFR 116 >59 mL/min/1.73   BUN/Creatinine Ratio 19 9 - 23   Sodium 140 134 - 144 mmol/L   Potassium 4.1 3.5 - 5.2 mmol/L   Chloride 104 96 - 106 mmol/L   CO2 24 20 - 29 mmol/L   Calcium 9.6 8.7 - 10.2 mg/dL   Total Protein 7.2 6.0 - 8.5 g/dL   Albumin 4.4 3.8 - 4.8 g/dL   Globulin, Total 2.8 1.5 - 4.5 g/dL   Albumin/Globulin Ratio 1.6 1.2 - 2.2   Bilirubin Total 0.3 0.0 - 1.2 mg/dL    Alkaline Phosphatase 68 44 - 121 IU/L   AST 21 0 - 40 IU/L  ALT 22 0 - 32 IU/L  Lipid Panel w/o Chol/HDL Ratio  Result Value Ref Range   Cholesterol, Total 184 100 - 199 mg/dL   Triglycerides 179 (H) 0 - 149 mg/dL   HDL 47 >39 mg/dL   VLDL Cholesterol Cal 31 5 - 40 mg/dL   LDL Chol Calc (NIH) 106 (H) 0 - 99 mg/dL  TSH  Result Value Ref Range   TSH 1.070 0.450 - 4.500 uIU/mL  Hepatitis C antibody  Result Value Ref Range   Hep C Virus Ab <0.1 0.0 - 0.9 s/co ratio  Bayer DCA Hb A1c Waived  Result Value Ref Range   HB A1C (BAYER DCA - WAIVED) 6.1 (H) 4.8 - 5.6 %  Cytology - PAP  Result Value Ref Range   High risk HPV Negative    Adequacy      Satisfactory for evaluation; transformation zone component ABSENT.   Diagnosis (A)     - Atypical squamous cells of undetermined significance (ASC-US)   Comment Normal Reference Range HPV - Negative       Assessment & Plan:   Problem List Items Addressed This Visit       Other   Depression, recurrent ([place])    Chronic. Was improved, but her sister was murdered 10 days ago.  She is caring for her nephew.  Will give trazodone due to difficulty sleeping.  Will also give Ativan for panic attacks. Advised not to use with Trazodone due to both causing increased sedation. Side effects and benefits of medication discussed.  This is only a short term solution.  Will follow up in 2 weeks to see how patient is doing.  Patient understands and agrees with the plan of care.       Relevant Medications   traZODone (DESYREL) 50 MG tablet   LORazepam (ATIVAN) 0.5 MG tablet   Other Visit Diagnoses     Acute cystitis with hematuria    -  Primary   Complete course of antibiotics. UA showed 3+ leuks. Urine sent for culutre. Complete course of antibiotics. Follow up if symptoms worsen or fail to improve.   Relevant Orders   Urine Culture   Dysuria       Relevant Orders   Urinalysis, Routine w reflex microscopic        Follow up plan: Return in 2  weeks (on 02/22/2021), or if symptoms worsen or fail to improve, for Depression/Anxiety FU.

## 2021-02-08 NOTE — Assessment & Plan Note (Signed)
Chronic. Was improved, but her sister was murdered 10 days ago.  She is caring for her nephew.  Will give trazodone due to difficulty sleeping.  Will also give Ativan for panic attacks. Advised not to use with Trazodone due to both causing increased sedation. Side effects and benefits of medication discussed.  This is only a short term solution.  Will follow up in 2 weeks to see how patient is doing.  Patient understands and agrees with the plan of care.

## 2021-02-09 LAB — URINALYSIS, ROUTINE W REFLEX MICROSCOPIC
Bilirubin, UA: NEGATIVE
Glucose, UA: NEGATIVE
Ketones, UA: NEGATIVE
Nitrite, UA: NEGATIVE
Specific Gravity, UA: >1.03 — ABNORMAL HIGH (ref 1.005–1.030)
Urobilinogen, Ur: 0.2 mg/dL (ref 0.2–1.0)
pH, UA: 5.5 (ref 5.0–7.5)

## 2021-02-09 LAB — MICROSCOPIC EXAMINATION

## 2021-02-09 NOTE — Progress Notes (Signed)
Results discussed with patient during visit.

## 2021-02-11 LAB — URINE CULTURE: Organism ID, Bacteria: NO GROWTH

## 2021-02-12 ENCOUNTER — Ambulatory Visit: Payer: No Typology Code available for payment source | Admitting: Nurse Practitioner

## 2021-02-12 NOTE — Progress Notes (Signed)
Hi Mandy Stewart. Your urine culture did not grow any bacteria. Complete the course of antibiotics you were. Let me know if your symptoms do not improve.

## 2021-02-15 ENCOUNTER — Encounter: Payer: Self-pay | Admitting: Nurse Practitioner

## 2021-02-15 MED ORDER — METRONIDAZOLE 0.75 % VA GEL: 1.0000 | Freq: Two times a day (BID) | VAGINAL | 0 refills | Status: AC

## 2021-02-15 NOTE — Telephone Encounter (Signed)
Spoke with patient on the phone.

## 2021-02-25 ENCOUNTER — Other Ambulatory Visit: Payer: Self-pay | Admitting: Nurse Practitioner

## 2021-02-25 ENCOUNTER — Ambulatory Visit: Payer: No Typology Code available for payment source | Admitting: Nurse Practitioner

## 2021-02-25 NOTE — Telephone Encounter (Signed)
Requested Prescriptions  Pending Prescriptions Disp Refills   metFORMIN (GLUCOPHAGE) 500 MG tablet [Pharmacy Med Name: METFORMIN HCL 500 MG TABLET] 60 tablet 1    Sig: TAKE 1 TABLET BY MOUTH 2 TIMES DAILY WITH A MEAL.     Endocrinology:  Diabetes - Biguanides Passed - 02/25/2021  1:40 AM      Passed - Cr in normal range and within 360 days    Creatinine, Ser  Date Value Ref Range Status  01/01/2021 0.70 0.57 - 1.00 mg/dL Final         Passed - HBA1C is between 0 and 7.9 and within 180 days    HB A1C (BAYER DCA - WAIVED)  Date Value Ref Range Status  01/01/2021 6.1 (H) 4.8 - 5.6 % Final    Comment:             Prediabetes: 5.7 - 6.4          Diabetes: >6.4          Glycemic control for adults with diabetes: <7.0          Passed - eGFR in normal range and within 360 days    GFR calc Af Amer  Date Value Ref Range Status  06/25/2017 >60 >60 mL/min Final    Comment:    (NOTE) The eGFR has been calculated using the CKD EPI equation. This calculation has not been validated in all clinical situations. eGFR's persistently <60 mL/min signify possible Chronic Kidney Disease.    GFR calc non Af Amer  Date Value Ref Range Status  06/25/2017 >60 >60 mL/min Final   eGFR  Date Value Ref Range Status  01/01/2021 116 >59 mL/min/1.73 Final         Passed - Valid encounter within last 6 months    Recent Outpatient Visits          2 weeks ago Acute cystitis with hematuria   [facility] Jon Billings, NP   1 month ago Routine general medical examination at a health care facility   [place], NP   2 months ago Depression, recurrent ([place])   [place], [name], NP      Future Appointments            Today Jon Billings, NP [facility], [place]

## 2021-02-25 NOTE — Progress Notes (Deleted)
There were no vitals taken for this visit.   Subjective:    Patient ID: Mandy Stewart, female    DOB: 1985-03-31, 35 y.o.   MRN: [id number]  HPI: Mandy Stewart is a 35 y.o. female  No chief complaint on file.  DEPRESSION/ANXIETY Patient states she was doing better then this past week.  She states the Prozac was increased at the last visit which really helped her.  Patient states she is having family problems which has caused her to depression exacerbated.  States she is also having panic attacks. She has an appt with a grief counselor on December 22. Denies SI.  Flowsheet Row Office Visit from 02/08/2021 in [facility]  PHQ-9 Total Score 20      GAD 7 : Generalized Anxiety Score 02/08/2021 01/01/2021 12/04/2020  Nervous, Anxious, on Edge 2 2 1   Control/stop worrying 2 2 1   Worry too much - different things 1 2 2   Trouble relaxing 1 1 0  Restless 1 0 0  Easily annoyed or irritable 1 2 2   Afraid - awful might happen 2 3 1   Total GAD 7 Score 10 12 7   Anxiety Difficulty Somewhat difficult Somewhat difficult Very difficult     Relevant past medical, surgical, family and social history reviewed and updated as indicated. Interim medical history since our last visit reviewed. Allergies and medications reviewed and updated.  Review of Systems  Constitutional:  Negative for fever.  Gastrointestinal:  Positive for abdominal pain. Negative for vomiting.  Genitourinary:  Positive for decreased urine volume, dysuria, frequency and urgency. Negative for flank pain and hematuria.  Musculoskeletal:  Negative for back pain.  Psychiatric/Behavioral:  Positive for dysphoric mood. Negative for suicidal ideas. The patient is nervous/anxious.    Per HPI unless specifically indicated above     Objective:    There were no vitals taken for this visit.  Wt Readings from Last 3 Encounters:  02/08/21 190 lb 9.6 oz (86.5 kg)  01/01/21 198 lb 9.6 oz (90.1 kg)  12/04/20 200 lb (90.7 kg)     Physical Exam Vitals and nursing note reviewed.  Constitutional:      General: She is not in acute distress.    Appearance: Normal appearance. She is normal weight. She is not ill-appearing, toxic-appearing or diaphoretic.  HENT:     Head: Normocephalic.     Right Ear: External ear normal.     Left Ear: External ear normal.     Nose: Nose normal.     Mouth/Throat:     Mouth: Mucous membranes are moist.     Pharynx: Oropharynx is clear.  Eyes:     General:        Right eye: No discharge.        Left eye: No discharge.     Extraocular Movements: Extraocular movements intact.     Conjunctiva/sclera: Conjunctivae normal.     Pupils: Pupils are equal, round, and reactive to light.  Cardiovascular:     Rate and Rhythm: Normal rate and regular rhythm.     Heart sounds: No murmur heard. Pulmonary:     Effort: Pulmonary effort is normal. No respiratory distress.     Breath sounds: Normal breath sounds. No wheezing or rales.  Abdominal:     General: Abdomen is flat. Bowel sounds are normal. There is no distension.     Palpations: Abdomen is soft.     Tenderness: There is abdominal tenderness. There is no right CVA tenderness, left CVA  tenderness or guarding.  Musculoskeletal:     Cervical back: Normal range of motion and neck supple.  Skin:    General: Skin is warm and dry.     Capillary Refill: Capillary refill takes less than 2 seconds.  Neurological:     General: No focal deficit present.     Mental Status: She is alert and oriented to person, place, and time. Mental status is at baseline.  Psychiatric:        Mood and Affect: Mood normal.        Behavior: Behavior normal.        Thought Content: Thought content normal.        Judgment: Judgment normal.    Results for orders placed or performed in visit on 02/08/21  Urine Culture   Specimen: Urine   UR  Result Value Ref Range   Urine Culture, Routine Final report    Organism ID, Bacteria No growth   Microscopic  Examination   Urine  Result Value Ref Range   WBC, UA 11-30 (A) 0 - 5 /hpf   RBC 0-2 0 - 2 /hpf   Epithelial Cells (non renal) 0-10 0 - 10 /hpf   Mucus, UA Present (A) Not Estab.   Bacteria, UA Many (A) None seen/Few   Trichomonas, UA Present (A) None seen  Urinalysis, Routine w reflex microscopic  Result Value Ref Range   Specific Gravity, UA >1.030 (H) 1.005 - 1.030   pH, UA 5.5 5.0 - 7.5   Color, UA Yellow Yellow   Appearance Ur Cloudy (A) Clear   Leukocytes,UA 3+ (A) Negative   Protein,UA Trace (A) Negative/Trace   Glucose, UA Negative Negative   Ketones, UA Negative Negative   RBC, UA Trace (A) Negative   Bilirubin, UA Negative Negative   Urobilinogen, Ur 0.2 0.2 - 1.0 mg/dL   Nitrite, UA Negative Negative   Microscopic Examination See below:       Assessment & Plan:   Problem List Items Addressed This Visit       Other   Anxiety   Depression, recurrent ([place]) - Primary     Follow up plan: No follow-ups on file.

## 2021-02-26 ENCOUNTER — Other Ambulatory Visit: Payer: Self-pay | Admitting: Nurse Practitioner

## 2021-02-26 NOTE — Telephone Encounter (Signed)
Requested Prescriptions  Pending Prescriptions Disp Refills   valsartan (DIOVAN) 80 MG tablet [Pharmacy Med Name: VALSARTAN 80 MG TABLET] 90 tablet 0    Sig: TAKE 1 TABLET BY MOUTH EVERY DAY     Cardiovascular:  Angiotensin Receptor Blockers Passed - 02/26/2021  1:33 AM      Passed - Cr in normal range and within 180 days    Creatinine, Ser  Date Value Ref Range Status  01/01/2021 0.70 0.57 - 1.00 mg/dL Final         Passed - K in normal range and within 180 days    Potassium  Date Value Ref Range Status  01/01/2021 4.1 3.5 - 5.2 mmol/L Final         Passed - Patient is not pregnant      Passed - Last BP in normal range    BP Readings from Last 1 Encounters:  02/08/21 119/79         Passed - Valid encounter within last 6 months    Recent Outpatient Visits          2 weeks ago Acute cystitis with hematuria   [facility] Larae Grooms, NP   1 month ago Routine general medical examination at a health care facility   [facility], Lauren A, NP   2 months ago Depression, recurrent (HCC)   [facility] Larae Grooms, NP      Future Appointments            In 1 week Larae Grooms, NP [facility]

## 2021-03-07 ENCOUNTER — Other Ambulatory Visit: Payer: Self-pay | Admitting: Nurse Practitioner

## 2021-03-07 NOTE — Telephone Encounter (Signed)
Requested Prescriptions  Pending Prescriptions Disp Refills   traZODone (DESYREL) 50 MG tablet [Pharmacy Med Name: TRAZODONE 50 MG TABLET] 30 tablet 0    Sig: TAKE 1/2 TO 1 TABLET BY MOUTH AT BEDTIME AS NEEDED FOR SLEEP     Psychiatry: Antidepressants - Serotonin Modulator Passed - 03/07/2021  9:37 AM      Passed - Completed PHQ-2 or PHQ-9 in the last 360 days      Passed - Valid encounter within last 6 months    Recent Outpatient Visits          3 weeks ago Acute cystitis with hematuria   [facility] Jon Billings, NP   2 months ago Routine general medical examination at a health care facility   [place], NP   3 months ago Depression, recurrent ([place])   [place], [name], NP      Future Appointments            In 4 days Jon Billings, NP [facility], [place]

## 2021-03-11 ENCOUNTER — Ambulatory Visit (INDEPENDENT_AMBULATORY_CARE_PROVIDER_SITE_OTHER): Payer: No Typology Code available for payment source | Admitting: Nurse Practitioner

## 2021-03-11 ENCOUNTER — Encounter: Payer: Self-pay | Admitting: Nurse Practitioner

## 2021-03-11 ENCOUNTER — Other Ambulatory Visit: Payer: Self-pay

## 2021-03-11 VITALS — BP 132/81 | HR 66 | Temp 99.0°F | Wt 188.2 lb

## 2021-03-11 DIAGNOSIS — F419 Anxiety disorder, unspecified: Secondary | ICD-10-CM | POA: Diagnosis not present

## 2021-03-11 DIAGNOSIS — F339 Major depressive disorder, recurrent, unspecified: Secondary | ICD-10-CM

## 2021-03-11 MED ORDER — FLUOXETINE HCL 40 MG PO CAPS: 40.0000 mg | ORAL_CAPSULE | Freq: Every day | ORAL | 1 refills | Status: DC

## 2021-03-11 MED ORDER — METHYLPREDNISOLONE 4 MG PO TBPK: ORAL_TABLET | ORAL | 0 refills | Status: DC

## 2021-03-11 NOTE — Assessment & Plan Note (Signed)
Chronic.  Ongoing.  Has only used the Lorazepam when she is feeling extremely anxious.  Will refill if patient needs before next visit.  Will increase the Prozac to 40mg  daily.  Follow up in 1 month for reevaluation.  Call sooner if concerns arise.

## 2021-03-11 NOTE — Assessment & Plan Note (Signed)
Chronic.  Ongoing.  Has only used the Lorazepam when she is feeling extremely anxious.  Will refill if patient needs before next visit.  Will increase the Prozac to 40mg daily.  Follow up in 1 month for reevaluation.  Call sooner if concerns arise.  °

## 2021-03-11 NOTE — Progress Notes (Signed)
BP 132/81    Pulse 66    Temp 99 F (37.2 C) (Oral)    Wt 188 lb 3.2 oz (85.4 kg)    SpO2 98%    BMI 35.58 kg/m    Subjective:    Patient ID: Mandy Stewart, female    DOB: 1986-02-07, 36 y.o.   MRN: [id number]  HPI: Mandy Stewart is a 36 y.o. female  Chief Complaint  Patient presents with   Anxiety   Depression   Cough    Pt states she has had a cough in the mornings for the last month and a half.    DEPRESSION/ANXIETY Patient states she is doing about the same.  Patient states she feels like the Prozac is helping some.  She states that when she lays down she can't get her mind to relax. She has had to use to ativan to help her relax.    Flowsheet Row Office Visit from 03/11/2021 in [facility]  PHQ-9 Total Score 12      GAD 7 : Generalized Anxiety Score 03/11/2021 02/08/2021 01/01/2021 12/04/2020  Nervous, Anxious, on Edge 2 2 2 1   Control/stop worrying 3 2 2 1   Worry too much - different things 3 1 2 2   Trouble relaxing 2 1 1  0  Restless 2 1 0 0  Easily annoyed or irritable 1 1 2 2   Afraid - awful might happen 3 2 3 1   Total GAD 7 Score 16 10 12 7   Anxiety Difficulty Somewhat difficult Somewhat difficult Somewhat difficult Very difficult     Relevant past medical, surgical, family and social history reviewed and updated as indicated. Interim medical history since our last visit reviewed. Allergies and medications reviewed and updated.  Review of Systems  Psychiatric/Behavioral:  Positive for dysphoric mood. Negative for suicidal ideas. The patient is nervous/anxious.    Per HPI unless specifically indicated above     Objective:    BP 132/81    Pulse 66    Temp 99 F (37.2 C) (Oral)    Wt 188 lb 3.2 oz (85.4 kg)    SpO2 98%    BMI 35.58 kg/m   Wt Readings from Last 3 Encounters:  03/11/21 188 lb 3.2 oz (85.4 kg)  02/08/21 190 lb 9.6 oz (86.5 kg)  01/01/21 198 lb 9.6 oz (90.1 kg)    Physical Exam Vitals and nursing note reviewed.  Constitutional:       General: She is not in acute distress.    Appearance: Normal appearance. She is normal weight. She is not ill-appearing, toxic-appearing or diaphoretic.  HENT:     Head: Normocephalic.     Right Ear: External ear normal.     Left Ear: External ear normal.     Nose: Nose normal.     Mouth/Throat:     Mouth: Mucous membranes are moist.     Pharynx: Oropharynx is clear.  Eyes:     General:        Right eye: No discharge.        Left eye: No discharge.     Extraocular Movements: Extraocular movements intact.     Conjunctiva/sclera: Conjunctivae normal.     Pupils: Pupils are equal, round, and reactive to light.  Cardiovascular:     Rate and Rhythm: Normal rate and regular rhythm.     Heart sounds: No murmur heard. Pulmonary:     Effort: Pulmonary effort is normal. No respiratory distress.     Breath sounds:  Normal breath sounds. No wheezing or rales.  Abdominal:     General: Abdomen is flat. Bowel sounds are normal. There is no distension.     Palpations: Abdomen is soft.     Tenderness: There is abdominal tenderness. There is no right CVA tenderness, left CVA tenderness or guarding.  Musculoskeletal:     Cervical back: Normal range of motion and neck supple.  Skin:    General: Skin is warm and dry.     Capillary Refill: Capillary refill takes less than 2 seconds.  Neurological:     General: No focal deficit present.     Mental Status: She is alert and oriented to person, place, and time. Mental status is at baseline.  Psychiatric:        Mood and Affect: Mood normal.        Behavior: Behavior normal.        Thought Content: Thought content normal.        Judgment: Judgment normal.    Results for orders placed or performed in visit on 02/08/21  Urine Culture   Specimen: Urine   UR  Result Value Ref Range   Urine Culture, Routine Final report    Organism ID, Bacteria No growth   Microscopic Examination   Urine  Result Value Ref Range   WBC, UA 11-30 (A) 0 - 5 /hpf    RBC 0-2 0 - 2 /hpf   Epithelial Cells (non renal) 0-10 0 - 10 /hpf   Mucus, UA Present (A) Not Estab.   Bacteria, UA Many (A) None seen/Few   Trichomonas, UA Present (A) None seen  Urinalysis, Routine w reflex microscopic  Result Value Ref Range   Specific Gravity, UA >1.030 (H) 1.005 - 1.030   pH, UA 5.5 5.0 - 7.5   Color, UA Yellow Yellow   Appearance Ur Cloudy (A) Clear   Leukocytes,UA 3+ (A) Negative   Protein,UA Trace (A) Negative/Trace   Glucose, UA Negative Negative   Ketones, UA Negative Negative   RBC, UA Trace (A) Negative   Bilirubin, UA Negative Negative   Urobilinogen, Ur 0.2 0.2 - 1.0 mg/dL   Nitrite, UA Negative Negative   Microscopic Examination See below:       Assessment & Plan:   Problem List Items Addressed This Visit       Other   Anxiety    Chronic.  Ongoing.  Has only used the Lorazepam when she is feeling extremely anxious.  Will refill if patient needs before next visit.  Will increase the Prozac to 40mg  daily.  Follow up in 1 month for reevaluation.  Call sooner if concerns arise.        Relevant Medications   FLUoxetine (PROZAC) 40 MG capsule   Depression, recurrent (HCC) - Primary    Chronic.  Ongoing.  Has only used the Lorazepam when she is feeling extremely anxious.  Will refill if patient needs before next visit.  Will increase the Prozac to 40mg  daily.  Follow up in 1 month for reevaluation.  Call sooner if concerns arise.       Relevant Medications   FLUoxetine (PROZAC) 40 MG capsule     Follow up plan: Return in about 1 month (around 04/11/2021) for Depression/Anxiety FU.

## 2021-03-19 ENCOUNTER — Other Ambulatory Visit: Payer: Self-pay | Admitting: Nurse Practitioner

## 2021-03-19 ENCOUNTER — Encounter: Payer: Self-pay | Admitting: Nurse Practitioner

## 2021-03-19 MED ORDER — LORAZEPAM 0.5 MG PO TABS: 0.5000 mg | ORAL_TABLET | Freq: Two times a day (BID) | ORAL | 0 refills | Status: DC | PRN

## 2021-04-12 ENCOUNTER — Ambulatory Visit: Payer: No Typology Code available for payment source | Admitting: Nurse Practitioner

## 2021-04-15 ENCOUNTER — Ambulatory Visit: Payer: No Typology Code available for payment source | Admitting: Nurse Practitioner

## 2021-05-10 ENCOUNTER — Other Ambulatory Visit: Payer: Self-pay

## 2021-05-10 ENCOUNTER — Encounter: Payer: Self-pay | Admitting: Nurse Practitioner

## 2021-05-10 ENCOUNTER — Ambulatory Visit (INDEPENDENT_AMBULATORY_CARE_PROVIDER_SITE_OTHER): Payer: No Typology Code available for payment source | Admitting: Nurse Practitioner

## 2021-05-10 VITALS — BP 127/83 | HR 85 | Temp 98.9°F | Wt 180.0 lb

## 2021-05-10 DIAGNOSIS — F419 Anxiety disorder, unspecified: Secondary | ICD-10-CM | POA: Diagnosis not present

## 2021-05-10 DIAGNOSIS — F339 Major depressive disorder, recurrent, unspecified: Secondary | ICD-10-CM

## 2021-05-10 MED ORDER — METFORMIN HCL 500 MG PO TABS: 500.0000 mg | ORAL_TABLET | Freq: Two times a day (BID) | ORAL | 1 refills | Status: DC

## 2021-05-10 MED ORDER — VALSARTAN 80 MG PO TABS: 80.0000 mg | ORAL_TABLET | Freq: Every day | ORAL | 1 refills | Status: DC

## 2021-05-10 NOTE — Assessment & Plan Note (Signed)
Chronic.  Improved from previous visit  Continue with current medication regimen of Prozac 40mg  daily.  If not improved at next visit will add low dose Abilify or Rexulti to help with symptoms.  Return to clinic in 2 months for reevaluation.  Call sooner if concerns arise.  ?

## 2021-05-10 NOTE — Progress Notes (Signed)
? ?BP 127/83   Pulse 85   Temp 98.9 ?F (37.2 ?C) (Oral)   Wt 180 lb (81.6 kg)   LMP 04/19/2021 (Approximate)   SpO2 97%   BMI 34.03 kg/m?   ? ?Subjective:  ? ? Patient ID: Mandy Stewart, female    DOB: 1985/08/11, 36 y.o.   MRN: [id number] ? ?HPI: ?Mandy Stewart is a 36 y.o. female ? ?Chief Complaint  ?Patient presents with  ? Depression  ? Hypertension  ? ?DEPRESSION/ANXIETY ?Patient states she is doing a little bit better. She is taking the Prozac 40mg .  Feels like her emotions have improved some.  Denies concerns at visit today.   ? ?Flowsheet Row Office Visit from 05/10/2021 in [facility]  ?PHQ-9 Total Score 8  ? ?  ? ?GAD 7 : Generalized Anxiety Score 05/10/2021 03/11/2021 02/08/2021 01/01/2021  ?Nervous, Anxious, on Edge 1 2 2 2   ?Control/stop worrying 2 3 2 2   ?Worry too much - different things 2 3 1 2   ?Trouble relaxing 1 2 1 1   ?Restless 0 2 1 0  ?Easily annoyed or irritable 1 1 1 2   ?Afraid - awful might happen 2 3 2 3   ?Total GAD 7 Score 9 16 10 12   ?Anxiety Difficulty Somewhat difficult Somewhat difficult Somewhat difficult Somewhat difficult  ? ? ? ?Relevant past medical, surgical, family and social history reviewed and updated as indicated. Interim medical history since our last visit reviewed. ?Allergies and medications reviewed and updated. ? ?Review of Systems  ?Psychiatric/Behavioral:  Positive for dysphoric mood. Negative for suicidal ideas. The patient is nervous/anxious.   ? ?Per HPI unless specifically indicated above ? ?   ?Objective:  ?  ?BP 127/83   Pulse 85   Temp 98.9 ?F (37.2 ?C) (Oral)   Wt 180 lb (81.6 kg)   LMP 04/19/2021 (Approximate)   SpO2 97%   BMI 34.03 kg/m?   ?Wt Readings from Last 3 Encounters:  ?05/10/21 180 lb (81.6 kg)  ?03/11/21 188 lb 3.2 oz (85.4 kg)  ?02/08/21 190 lb 9.6 oz (86.5 kg)  ?  ?Physical Exam ?Vitals and nursing note reviewed.  ?Constitutional:   ?   General: She is not in acute distress. ?   Appearance: Normal appearance. She is normal weight.  She is not ill-appearing, toxic-appearing or diaphoretic.  ?HENT:  ?   Head: Normocephalic.  ?   Right Ear: External ear normal.  ?   Left Ear: External ear normal.  ?   Nose: Nose normal.  ?   Mouth/Throat:  ?   Mouth: Mucous membranes are moist.  ?   Pharynx: Oropharynx is clear.  ?Eyes:  ?   General:     ?   Right eye: No discharge.     ?   Left eye: No discharge.  ?   Extraocular Movements: Extraocular movements intact.  ?   Conjunctiva/sclera: Conjunctivae normal.  ?   Pupils: Pupils are equal, round, and reactive to light.  ?Cardiovascular:  ?   Rate and Rhythm: Normal rate and regular rhythm.  ?   Heart sounds: No murmur heard. ?Pulmonary:  ?   Effort: Pulmonary effort is normal. No respiratory distress.  ?   Breath sounds: Normal breath sounds. No wheezing or rales.  ?Abdominal:  ?   General: Abdomen is flat. Bowel sounds are normal. There is no distension.  ?   Palpations: Abdomen is soft.  ?   Tenderness: There is abdominal tenderness. There is no right  CVA tenderness, left CVA tenderness or guarding.  ?Musculoskeletal:  ?   Cervical back: Normal range of motion and neck supple.  ?Skin: ?   General: Skin is warm and dry.  ?   Capillary Refill: Capillary refill takes less than 2 seconds.  ?Neurological:  ?   General: No focal deficit present.  ?   Mental Status: She is alert and oriented to person, place, and time. Mental status is at baseline.  ?Psychiatric:     ?   Mood and Affect: Mood normal.     ?   Behavior: Behavior normal.     ?   Thought Content: Thought content normal.     ?   Judgment: Judgment normal.  ? ? ?Results for orders placed or performed in visit on 02/08/21  ?Urine Culture  ? Specimen: Urine  ? UR  ?Result Value Ref Range  ? Urine Culture, Routine Final report   ? Organism ID, Bacteria No growth   ?Microscopic Examination  ? Urine  ?Result Value Ref Range  ? WBC, UA 11-30 (A) 0 - 5 /hpf  ? RBC 0-2 0 - 2 /hpf  ? Epithelial Cells (non renal) 0-10 0 - 10 /hpf  ? Mucus, UA Present (A) Not  Estab.  ? Bacteria, UA Many (A) None seen/Few  ? Trichomonas, UA Present (A) None seen  ?Urinalysis, Routine w reflex microscopic  ?Result Value Ref Range  ? Specific Gravity, UA >1.030 (H) 1.005 - 1.030  ? pH, UA 5.5 5.0 - 7.5  ? Color, UA Yellow Yellow  ? Appearance Ur Cloudy (A) Clear  ? Leukocytes,UA 3+ (A) Negative  ? Protein,UA Trace (A) Negative/Trace  ? Glucose, UA Negative Negative  ? Ketones, UA Negative Negative  ? RBC, UA Trace (A) Negative  ? Bilirubin, UA Negative Negative  ? Urobilinogen, Ur 0.2 0.2 - 1.0 mg/dL  ? Nitrite, UA Negative Negative  ? Microscopic Examination See below:   ? ?   ?Assessment & Plan:  ? ?Problem List Items Addressed This Visit   ? ?  ? Other  ? Anxiety - Primary  ?  Chronic.  Improved from previous visit  Continue with current medication regimen of Prozac 40mg  daily.  If not improved at next visit will add low dose Abilify or Rexulti to help with symptoms.  Return to clinic in 2 months for reevaluation.  Call sooner if concerns arise.  ? ?  ?  ? Depression, recurrent (HCC)  ?  Chronic.  Improved from previous visit  Continue with current medication regimen of Prozac 40mg  daily.  If not improved at next visit will add low dose Abilify or Rexulti to help with symptoms.  Return to clinic in 2 months for reevaluation.  Call sooner if concerns arise.  ?  ?  ?  ? ?Follow up plan: ?Return in about 2 months (around 07/10/2021) for Depression/Anxiety FU, BP Check. ? ? ? ? ? ? ?

## 2021-05-10 NOTE — Assessment & Plan Note (Signed)
Chronic.  Improved from previous visit  Continue with current medication regimen of Prozac 40mg daily.  If not improved at next visit will add low dose Abilify or Rexulti to help with symptoms.  Return to clinic in 2 months for reevaluation.  Call sooner if concerns arise.  ?

## 2021-05-31 ENCOUNTER — Encounter: Payer: Self-pay | Admitting: Nurse Practitioner

## 2021-06-07 ENCOUNTER — Other Ambulatory Visit: Payer: Self-pay | Admitting: Nurse Practitioner

## 2021-06-07 NOTE — Telephone Encounter (Signed)
Patient called stating she lost her job and insurance. She would like two of her meds sent to [facility] on [address]. They have the $4 plan on some medications and she wants to see if these are covered. valsartan (DIOVAN) 80 MG tablet & FLUoxetine (PROZAC) 40 MG capsule ?

## 2021-06-08 MED ORDER — FLUOXETINE HCL 40 MG PO CAPS: 40.0000 mg | ORAL_CAPSULE | Freq: Every day | ORAL | 0 refills | Status: DC

## 2021-06-08 MED ORDER — VALSARTAN 80 MG PO TABS: 80.0000 mg | ORAL_TABLET | Freq: Every day | ORAL | 0 refills | Status: DC

## 2021-06-08 NOTE — Telephone Encounter (Signed)
Change in pharmacy- remainder of RF sent to [facility] as requested ?Requested Prescriptions  ?Pending Prescriptions Disp Refills  ?? valsartan (DIOVAN) 80 MG tablet 90 tablet 1  ?  Sig: Take 1 tablet (80 mg total) by mouth daily.  ?  ? Cardiovascular:  Angiotensin Receptor Blockers Passed - 06/07/2021 12:40 PM  ?  ?  Passed - Cr in normal range and within 180 days  ?  Creatinine, Ser  ?Date Value Ref Range Status  ?01/01/2021 0.70 0.57 - 1.00 mg/dL Final  ?   ?  ?  Passed - K in normal range and within 180 days  ?  Potassium  ?Date Value Ref Range Status  ?01/01/2021 4.1 3.5 - 5.2 mmol/L Final  ?   ?  ?  Passed - Patient is not pregnant  ?  ?  Passed - Last BP in normal range  ?  BP Readings from Last 1 Encounters:  ?05/10/21 127/83  ?   ?  ?  Passed - Valid encounter within last 6 months  ?  Recent Outpatient Visits   ?      ? 4 weeks ago Anxiety  ? [place], NP  ? 2 months ago Depression, recurrent ([place])  ? [place], NP  ? 4 months ago Acute cystitis with hematuria  ? [place], NP  ? 5 months ago Routine general medical examination at a health care facility  ? [place], Lauren A, NP  ? 6 months ago Depression, recurrent ([place])  ? [facility] Jon Billings, NP  ?  ?  ?Future Appointments   ?        ? In 1 month Jon Billings, NP [facility]  ?  ? ?  ?  ?  ?? FLUoxetine (PROZAC) 40 MG capsule 90 capsule 1  ?  Sig: Take 1 capsule (40 mg total) by mouth daily.  ?  ? Psychiatry:  Antidepressants - SSRI Passed - 06/07/2021 12:40 PM  ?  ?  Passed - Completed PHQ-2 or PHQ-9 in the last 360 days  ?  ?  Passed - Valid encounter within last 6 months  ?  Recent Outpatient Visits   ?      ? 4 weeks ago Anxiety  ? [place], NP  ? 2 months ago Depression, recurrent ([place])  ? [place], NP  ? 4 months ago  Acute cystitis with hematuria  ? [place], NP  ? 5 months ago Routine general medical examination at a health care facility  ? [place], Lauren A, NP  ? 6 months ago Depression, recurrent ([place])  ? [facility] Jon Billings, NP  ?  ?  ?Future Appointments   ?        ? In 1 month Jon Billings, NP [facility], [facility]  ?  ? ?  ?  ?  ? ?

## 2021-07-09 NOTE — Progress Notes (Deleted)
There were no vitals taken for this visit.   Subjective:    Patient ID: Mandy Stewart, female    DOB: 06/24/1985, 36 y.o.   MRN: [id number]  HPI: [name] is a 36 y.o. female  No chief complaint on file.  DEPRESSION/ANXIETY Patient states she is doing a little bit better. She is taking the Prozac 40mg .  Feels like her emotions have improved some.  Denies concerns at visit today.    Flowsheet Row Office Visit from 05/10/2021 in [facility]  PHQ-9 Total Score 8         05/10/2021    4:06 PM 03/11/2021    3:09 PM 02/08/2021    4:03 PM 01/01/2021   11:38 AM  GAD 7 : Generalized Anxiety Score  Nervous, Anxious, on Edge 1 2 2 2   Control/stop worrying 2 3 2 2   Worry too much - different things 2 3 1 2   Trouble relaxing 1 2 1 1   Restless 0 2 1 0  Easily annoyed or irritable 1 1 1 2   Afraid - awful might happen 2 3 2 3   Total GAD 7 Score 9 16 10 12   Anxiety Difficulty Somewhat difficult Somewhat difficult Somewhat difficult Somewhat difficult     Relevant past medical, surgical, family and social history reviewed and updated as indicated. Interim medical history since our last visit reviewed. Allergies and medications reviewed and updated.  Review of Systems  Psychiatric/Behavioral:  Positive for dysphoric mood. Negative for suicidal ideas. The patient is nervous/anxious.    Per HPI unless specifically indicated above     Objective:    There were no vitals taken for this visit.  Wt Readings from Last 3 Encounters:  05/10/21 180 lb (81.6 kg)  03/11/21 188 lb 3.2 oz (85.4 kg)  02/08/21 190 lb 9.6 oz (86.5 kg)    Physical Exam Vitals and nursing note reviewed.  Constitutional:      General: She is not in acute distress.    Appearance: Normal appearance. She is normal weight. She is not ill-appearing, toxic-appearing or diaphoretic.  HENT:     Head: Normocephalic.     Right Ear: External ear normal.     Left Ear: External ear normal.     Nose: Nose  normal.     Mouth/Throat:     Mouth: Mucous membranes are moist.     Pharynx: Oropharynx is clear.  Eyes:     General:        Right eye: No discharge.        Left eye: No discharge.     Extraocular Movements: Extraocular movements intact.     Conjunctiva/sclera: Conjunctivae normal.     Pupils: Pupils are equal, round, and reactive to light.  Cardiovascular:     Rate and Rhythm: Normal rate and regular rhythm.     Heart sounds: No murmur heard. Pulmonary:     Effort: Pulmonary effort is normal. No respiratory distress.     Breath sounds: Normal breath sounds. No wheezing or rales.  Abdominal:     General: Abdomen is flat. Bowel sounds are normal. There is no distension.     Palpations: Abdomen is soft.     Tenderness: There is abdominal tenderness. There is no right CVA tenderness, left CVA tenderness or guarding.  Musculoskeletal:     Cervical back: Normal range of motion and neck supple.  Skin:    General: Skin is warm and dry.     Capillary Refill: Capillary refill  takes less than 2 seconds.  Neurological:     General: No focal deficit present.     Mental Status: She is alert and oriented to person, place, and time. Mental status is at baseline.  Psychiatric:        Mood and Affect: Mood normal.        Behavior: Behavior normal.        Thought Content: Thought content normal.        Judgment: Judgment normal.    Results for orders placed or performed in visit on 02/08/21  Urine Culture   Specimen: Urine   UR  Result Value Ref Range   Urine Culture, Routine Final report    Organism ID, Bacteria No growth   Microscopic Examination   Urine  Result Value Ref Range   WBC, UA 11-30 (A) 0 - 5 /hpf   RBC 0-2 0 - 2 /hpf   Epithelial Cells (non renal) 0-10 0 - 10 /hpf   Mucus, UA Present (A) Not Estab.   Bacteria, UA Many (A) None seen/Few   Trichomonas, UA Present (A) None seen  Urinalysis, Routine w reflex microscopic  Result Value Ref Range   Specific Gravity, UA  >1.030 (H) 1.005 - 1.030   pH, UA 5.5 5.0 - 7.5   Color, UA Yellow Yellow   Appearance Ur Cloudy (A) Clear   Leukocytes,UA 3+ (A) Negative   Protein,UA Trace (A) Negative/Trace   Glucose, UA Negative Negative   Ketones, UA Negative Negative   RBC, UA Trace (A) Negative   Bilirubin, UA Negative Negative   Urobilinogen, Ur 0.2 0.2 - 1.0 mg/dL   Nitrite, UA Negative Negative   Microscopic Examination See below:       Assessment & Plan:   Problem List Items Addressed This Visit   None    Follow up plan: No follow-ups on file.

## 2021-07-12 ENCOUNTER — Ambulatory Visit: Payer: Self-pay | Admitting: Nurse Practitioner

## 2021-12-08 ENCOUNTER — Other Ambulatory Visit: Payer: Self-pay

## 2021-12-08 ENCOUNTER — Emergency Department: Payer: Self-pay

## 2021-12-08 DIAGNOSIS — J209 Acute bronchitis, unspecified: Secondary | ICD-10-CM | POA: Insufficient documentation

## 2021-12-08 DIAGNOSIS — Z20822 Contact with and (suspected) exposure to covid-19: Secondary | ICD-10-CM | POA: Insufficient documentation

## 2021-12-08 LAB — RESP PANEL BY RT-PCR (FLU A&B, COVID) ARPGX2
Influenza A by PCR: NEGATIVE
Influenza B by PCR: NEGATIVE
SARS Coronavirus 2 by RT PCR: NEGATIVE

## 2021-12-08 NOTE — ED Triage Notes (Signed)
Pt to ED via POV with c/o congestion and coughing. Pt has had these symptoms for about 2 weeks. Has been taking dayquil but has not worked. Pt had fever last night. Pt has been coughing up mucus.

## 2021-12-09 ENCOUNTER — Emergency Department
Admission: EM | Admit: 2021-12-09 | Discharge: 2021-12-09 | Disposition: A | Discharge status: Home / Self Care | Payer: Self-pay | Attending: Emergency Medicine | Admitting: Emergency Medicine

## 2021-12-09 DIAGNOSIS — J209 Acute bronchitis, unspecified: Secondary | ICD-10-CM

## 2021-12-09 MED ORDER — AZITHROMYCIN 250 MG PO TABS: ORAL_TABLET | ORAL | 0 refills | Status: DC

## 2021-12-09 MED ORDER — BENZONATATE 100 MG PO CAPS: 100.0000 mg | ORAL_CAPSULE | Freq: Three times a day (TID) | ORAL | 0 refills | Status: DC | PRN

## 2021-12-09 NOTE — Discharge Instructions (Signed)
Follow up with your regular doctor if not improving in 3 to 4 days Return if worsening

## 2021-12-09 NOTE — ED Provider Notes (Signed)
[facility] Provider Note    None    (approximate)   History   Cough   HPI  Mandy Stewart is a 36 y.o. female no significant past medical history presents emergency department complaining of cough and congestion for 2 weeks.  Patient states she cannot sleep at night due to cough.  No fever.  States usually she uses [employer] and that greatly helps but does not have any.  No vomiting or diarrhea      Physical Exam   Triage Vital Signs: ED Triage Vitals  Enc Vitals Group     BP 12/08/21 2227 127/81     Pulse Rate 12/08/21 2227 (!) 102     Resp 12/08/21 2227 20     Temp 12/08/21 2227 98.2 F (36.8 C)     Temp Source 12/08/21 2227 Oral     SpO2 12/08/21 2227 95 %     Weight 12/08/21 2216 185 lb (83.9 kg)     Height 12/08/21 2216 5\' 1"  (1.549 m)     Head Circumference --      Peak Flow --      Pain Score --      Pain Loc --      Pain Edu? --      Excl. in GC? --     Most recent vital signs: Vitals:   12/08/21 2227 12/09/21 0558  BP: 127/81 122/84  Pulse: (!) 102 100  Resp: 20 17  Temp: 98.2 F (36.8 C) 99.7 F (37.6 C)  SpO2: 95% 94%     General: Awake, no distress.   CV:  Good peripheral perfusion. regular rate and  rhythm Resp:  Normal effort. Lungs CTA, cough is constant and hacking Abd:  No distention.   Other:      ED Results / Procedures / Treatments   Labs (all labs ordered are listed, but only abnormal results are displayed) Labs Reviewed  RESP PANEL BY RT-PCR (FLU A&B, COVID) ARPGX2  POC URINE PREG, ED     EKG     RADIOLOGY Chest x-ray    PROCEDURES:   Procedures   MEDICATIONS ORDERED IN ED: Medications - No data to display   IMPRESSION / MDM / ASSESSMENT AND PLAN / ED COURSE  I reviewed the triage vital signs and the nursing notes.                              Differential diagnosis includes, but is not limited to, acute bronchitis, COVID, CAP  Patient's presentation is most consistent  with acute complicated illness / injury requiring diagnostic workup.  Patient's labs for COVID/flu are reassuring.  They are both negative.  Chest x-ray was independently reviewed and interpreted by me as being negative.  Confirmed by radiology.  I did explain all these findings to the patient.  She was given a prescription for Z-Pak and Tessalon Perles.  She is to follow-up with her regular doctor if not improving in 3 days.  Return if worsening.  She is to use over-the-counter comfort measures such as Tylenol or ibuprofen.  Vicks vapor rub.  She was given a work note and discharged stable condition.      FINAL CLINICAL IMPRESSION(S) / ED DIAGNOSES   Final diagnoses:  Acute bronchitis, unspecified organism     Rx / DC Orders   ED Discharge Orders          Ordered  azithromycin (ZITHROMAX Z-PAK) 250 MG tablet        12/09/21 0737    benzonatate (TESSALON PERLES) 100 MG capsule  3 times daily PRN        12/09/21 0737             Note:  This document was prepared using Dragon voice recognition software and may include unintentional dictation errors.    Versie Starks, PA-C 12/09/21 [employer]    Carrie Mew, MD 12/14/21 1047

## 2021-12-10 ENCOUNTER — Telehealth: Payer: Self-pay

## 2021-12-10 NOTE — Telephone Encounter (Signed)
Transition Care Management Unsuccessful Follow-up Telephone Call  Date of discharge and from where:  12/09/21, [facility]  Attempts:  1st Attempt  Reason for unsuccessful TCM follow-up call:  Left voice message

## 2021-12-13 NOTE — Telephone Encounter (Signed)
Pt returned call   please call her back [phone]

## 2021-12-13 NOTE — Telephone Encounter (Signed)
Transition Care Management Follow-up Telephone Call Date of discharge and from where: 12/09/21, [facility] How have you been since you were released from the hospital? "Good. My cough has basically subsided and I can breathe a lot better." Any questions or concerns? No  Items Reviewed: Did the pt receive and understand the discharge instructions provided? Yes  Medications obtained and verified? Yes  Other? No  Any new allergies since your discharge? Yes  Dietary orders reviewed? Yes Do you have support at home? Yes   Home Care and Equipment/Supplies: Were home health services ordered? not applicable If so, what is the name of the agency? N/A  Has the agency set up a time to come to the patient's home? not applicable Were any new equipment or medical supplies ordered?  No What is the name of the medical supply agency? N/A Were you able to get the supplies/equipment? not applicable Do you have any questions related to the use of the equipment or supplies? No  Functional Questionnaire: (I = Independent and D = Dependent) ADLs: I  Bathing/Dressing- I  Meal Prep- I  Eating- I  Maintaining continence- I  Transferring/Ambulation- I  Managing Meds- I  Follow up appointments reviewed:  PCP Hospital f/u appt confirmed? No . Patient states she is better and does not need to be seen for a follow up.  [facility] f/u appt confirmed? No   Are transportation arrangements needed? No  If their condition worsens, is the pt aware to call PCP or go to the Emergency Dept.? Yes Was the patient provided with contact information for the PCP's office or ED? Yes Was to pt encouraged to call back with questions or concerns? Yes

## 2021-12-13 NOTE — Telephone Encounter (Signed)
Transition Care Management Unsuccessful Follow-up Telephone Call  Date of discharge and from where:  12/09/21, [facility]  Attempts:  2nd Attempt  Reason for unsuccessful TCM follow-up call:  Left voice message

## 2022-01-03 NOTE — Progress Notes (Unsigned)
There were no vitals taken for this visit.   Subjective:    Patient ID: Mandy Stewart, female    DOB: October 14, 1985, 36 y.o.   MRN: [id number]  HPI: Mandy Stewart is a 36 y.o. female presenting on 01/04/2022 for comprehensive medical examination. Current medical complaints include:{Blank single:19197::"none","***"}  She currently lives with: Menopausal Symptoms: {Blank single:19197::"yes","no"}  HYPERTENSION {Blank single:19197::"without","with"} Chronic Kidney Disease Hypertension status: {Blank single:19197::"controlled","uncontrolled","better","worse","exacerbated","stable"}  Satisfied with current treatment? {Blank single:19197::"yes","no"} Duration of hypertension: {Blank single:19197::"chronic","months","years"} BP monitoring frequency:  {Blank single:19197::"not checking","rarely","daily","weekly","monthly","a few times a day","a few times a week","a few times a month"} BP range:  BP medication side effects:  {Blank single:19197::"yes","no"} Medication compliance: {Blank single:19197::"excellent compliance","good compliance","fair compliance","poor compliance"} Previous BP meds:{Blank [id number] (bystolic)","carvedilol","chlorthalidone","clonidine","diltiazem","exforge HCT","HCTZ","irbesartan (avapro)","labetalol","lisinopril","lisinopril-HCTZ","losartan (cozaar)","methyldopa","nifedipine","olmesartan (benicar)","olmesartan-HCTZ","quinapril","ramipril","spironalactone","tekturna","valsartan","valsartan-HCTZ","verapamil"} Aspirin: {Blank single:19197::"yes","no"} Recurrent headaches: {Blank single:19197::"yes","no"} Visual changes: {Blank single:19197::"yes","no"} Palpitations: {Blank single:19197::"yes","no"} Dyspnea: {Blank single:19197::"yes","no"} Chest pain: {Blank single:19197::"yes","no"} Lower extremity edema: {Blank single:19197::"yes","no"} Dizzy/lightheaded: {Blank  single:19197::"yes","no"}  MOOD  Depression Screen done today and results listed below:     05/10/2021    4:06 PM 03/11/2021    3:09 PM 02/08/2021    4:03 PM 01/01/2021   11:37 AM 12/04/2020    3:29 PM  Depression screen PHQ 2/9  Decreased Interest 1 2 3 2 1   Down, Depressed, Hopeless 2 2 3 2 2   PHQ - 2 Score 3 4 6 4 3   Altered sleeping 2 3 3  0 2  Tired, decreased energy 1 2 2 1 2   Change in appetite 0 2 3 1 2   Feeling bad or failure about yourself  1 0 1 0 3  Trouble concentrating 1 1 2  0 1  Moving slowly or fidgety/restless 0 0 3 0 1  Suicidal thoughts 0 0 0 0 1  PHQ-9 Score 8 12 20 6 15   Difficult doing work/chores Somewhat difficult Somewhat difficult Somewhat difficult      The patient {has/does not have:19849} a history of falls. I {did/did not:19850} complete a risk assessment for falls. A plan of care for falls {was/was not:19852} documented.   Past Medical History:  Past Medical History:  Diagnosis Date   Medical history non-contributory     Surgical History:  Past Surgical History:  Procedure Laterality Date   CHOLECYSTECTOMY N/A 12/22/2014   Procedure: LAPAROSCOPIC CHOLECYSTECTOMY;  Surgeon: Aviva Signs, MD;  Location: [facility];  Service: General;  Laterality: N/A;    Medications:  Current Outpatient Medications on File Prior to Visit  Medication Sig   acetaminophen (TYLENOL) 325 MG tablet Take 2 tablets (650 mg total) by mouth every 6 (six) hours as needed for mild pain or moderate pain.   azithromycin (ZITHROMAX Z-PAK) 250 MG tablet 2 pills today then 1 pill a day for 4 days   benzonatate (TESSALON PERLES) 100 MG capsule Take 1 capsule (100 mg total) by mouth 3 (three) times daily as needed for cough.   FLUoxetine (PROZAC) 40 MG capsule Take 1 capsule (40 mg total) by mouth daily.   fluticasone (FLONASE) 50 MCG/ACT nasal spray Place 2 sprays into both nostrils daily.   LORazepam (ATIVAN) 0.5 MG tablet Take 1 tablet (0.5 mg total) by mouth 2 (two) times daily  as needed for anxiety.   metFORMIN (GLUCOPHAGE) 500 MG tablet Take 1 tablet (500 mg total) by mouth 2 (two) times daily with a meal.   naproxen (NAPROSYN) 500 MG tablet Take 1 tablet (500 mg total) by mouth 2 (two) times daily.   Norethindrone Acetate-Ethinyl Estrad-FE (LOESTRIN 24 FE) 1-20 MG-MCG(24) tablet  Take 1 tablet by mouth daily.   promethazine (PHENERGAN) 25 MG tablet TAKE 1 TABLET BY MOUTH EVERY 6 HOURS AS NEEDED FOR NAUSEA OR VOMITING (MIGRAINES)   valsartan (DIOVAN) 80 MG tablet Take 1 tablet (80 mg total) by mouth daily.   [DISCONTINUED] montelukast (SINGULAIR) 10 MG tablet Take 10 mg by mouth at bedtime as needed (for allergy relief).    No current facility-administered medications on file prior to visit.    Allergies:  Allergies  Allergen Reactions   Codeine Hives and Nausea And Vomiting    Social History:  Social History   Socioeconomic History   Marital status: Married    Spouse name: Not on file   Number of children: Not on file   Years of education: Not on file   Highest education level: Not on file  Occupational History   Not on file  Tobacco Use   Smoking status: Never   Smokeless tobacco: Never  Vaping Use   Vaping Use: Never used  Substance and Sexual Activity   Alcohol use: Yes    Alcohol/week: 1.0 standard drink of alcohol    Types: 1 Cans of beer per week    Comment: occasionally   Drug use: No   Sexual activity: Yes    Birth control/protection: None, Condom  Other Topics Concern   Not on file  Social History Narrative   Not on file   Social Determinants of Health   Financial Resource Strain: Not on file  Food Insecurity: Not on file  Transportation Needs: Not on file  Physical Activity: Not on file  Stress: Not on file  Social Connections: Not on file  Intimate Partner Violence: Not on file   Social History   Tobacco Use  Smoking Status Never  Smokeless Tobacco Never   Social History   Substance and Sexual Activity  Alcohol  Use Yes   Alcohol/week: 1.0 standard drink of alcohol   Types: 1 Cans of beer per week   Comment: occasionally    Family History:  Family History  Problem Relation Age of Onset   Asthma Mother    Depression Mother    Anxiety disorder Mother    Heart disease Mother    COPD Mother    Bipolar disorder Mother    Hyperlipidemia Father    Diabetes Father    Diabetes Sister     Past medical history, surgical history, medications, allergies, family history and social history reviewed with patient today and changes made to appropriate areas of the chart.   ROS All other ROS negative except what is listed above and in the HPI.      Objective:    There were no vitals taken for this visit.  Wt Readings from Last 3 Encounters:  12/08/21 185 lb (83.9 kg)  05/10/21 180 lb (81.6 kg)  03/11/21 188 lb 3.2 oz (85.4 kg)    Physical Exam  Results for orders placed or performed during the hospital encounter of 12/09/21  Resp Panel by RT-PCR (Flu A&B, Covid) Anterior Nasal Swab   Specimen: Anterior Nasal Swab  Result Value Ref Range   SARS Coronavirus 2 by RT PCR NEGATIVE NEGATIVE   Influenza A by PCR NEGATIVE NEGATIVE   Influenza B by PCR NEGATIVE NEGATIVE      Assessment & Plan:   Problem List Items Addressed This Visit       Cardiovascular and Mediastinum   Primary hypertension - Primary     Other   Anxiety  Depression, recurrent ([place])     Follow up plan: No follow-ups on file.   LABORATORY TESTING:  - Pap smear: {Blank XBDZHG:99242::"AST done","not applicable","up to date","done elsewhere"}  IMMUNIZATIONS:   - Tdap: Tetanus vaccination status reviewed: {tetanus status:315746}. - Influenza: {Blank single:19197::"Up to date","Administered today","Postponed to flu season","Refused","Given elsewhere"} - Pneumovax: {Blank single:19197::"Up to date","Administered today","Not applicable","Refused","Given elsewhere"} - Prevnar: {Blank single:19197::"Up to  date","Administered today","Not applicable","Refused","Given elsewhere"} - COVID: {Blank single:19197::"Up to date","Administered today","Not applicable","Refused","Given elsewhere"} - HPV: {Blank single:19197::"Up to date","Administered today","Not applicable","Refused","Given elsewhere"} - Shingrix vaccine: {Blank single:19197::"Up to date","Administered today","Not applicable","Refused","Given elsewhere"}  SCREENING: -Mammogram: {Blank single:19197::"Up to date","Ordered today","Not applicable","Refused","Done elsewhere"}  - Colonoscopy: {Blank single:19197::"Up to date","Ordered today","Not applicable","Refused","Done elsewhere"}  - Bone Density: {Blank single:19197::"Up to date","Ordered today","Not applicable","Refused","Done elsewhere"}  -Hearing Test: {Blank single:19197::"Up to date","Ordered today","Not applicable","Refused","Done elsewhere"}  -Spirometry: {Blank single:19197::"Up to date","Ordered today","Not applicable","Refused","Done elsewhere"}   PATIENT COUNSELING:   Advised to take 1 mg of folate supplement per day if capable of pregnancy.   Sexuality: Discussed sexually transmitted diseases, partner selection, use of condoms, avoidance of unintended pregnancy  and contraceptive alternatives.   Advised to avoid cigarette smoking.  I discussed with the patient that most people either abstain from alcohol or drink within safe limits (<=14/week and <=4 drinks/occasion for males, <=7/weeks and <= 3 drinks/occasion for females) and that the risk for alcohol disorders and other health effects rises proportionally with the number of drinks per week and how often a drinker exceeds daily limits.  Discussed cessation/primary prevention of drug use and availability of treatment for abuse.   Diet: Encouraged to adjust caloric intake to maintain  or achieve ideal body weight, to reduce intake of dietary saturated fat and total fat, to limit sodium intake by avoiding high sodium foods and  not adding table salt, and to maintain adequate dietary potassium and calcium preferably from fresh fruits, vegetables, and low-fat dairy products.    stressed the importance of regular exercise  Injury prevention: Discussed safety belts, safety helmets, smoke detector, smoking near bedding or upholstery.   Dental health: Discussed importance of regular tooth brushing, flossing, and dental visits.    NEXT PREVENTATIVE PHYSICAL DUE IN 1 YEAR. No follow-ups on file.

## 2022-01-04 ENCOUNTER — Other Ambulatory Visit (HOSPITAL_COMMUNITY)
Admission: RE | Admit: 2022-01-04 | Discharge: 2022-01-04 | Disposition: A | Discharge status: Home / Self Care | Payer: Commercial Managed Care - HMO | Source: Ambulatory Visit | Attending: Nurse Practitioner | Admitting: Nurse Practitioner

## 2022-01-04 ENCOUNTER — Encounter: Payer: Self-pay | Admitting: Nurse Practitioner

## 2022-01-04 ENCOUNTER — Ambulatory Visit (INDEPENDENT_AMBULATORY_CARE_PROVIDER_SITE_OTHER): Payer: Commercial Managed Care - HMO | Admitting: Nurse Practitioner

## 2022-01-04 ENCOUNTER — Other Ambulatory Visit: Payer: Self-pay | Admitting: Nurse Practitioner

## 2022-01-04 VITALS — BP 136/90 | HR 75 | Temp 98.2°F | Ht 62.3 in | Wt 202.2 lb

## 2022-01-04 DIAGNOSIS — I10 Essential (primary) hypertension: Secondary | ICD-10-CM

## 2022-01-04 DIAGNOSIS — Z114 Encounter for screening for human immunodeficiency virus [HIV]: Secondary | ICD-10-CM

## 2022-01-04 DIAGNOSIS — F419 Anxiety disorder, unspecified: Secondary | ICD-10-CM | POA: Diagnosis not present

## 2022-01-04 DIAGNOSIS — Z6830 Body mass index (BMI) 30.0-30.9, adult: Secondary | ICD-10-CM | POA: Diagnosis not present

## 2022-01-04 DIAGNOSIS — F339 Major depressive disorder, recurrent, unspecified: Secondary | ICD-10-CM | POA: Diagnosis not present

## 2022-01-04 DIAGNOSIS — R7303 Prediabetes: Secondary | ICD-10-CM | POA: Diagnosis not present

## 2022-01-04 DIAGNOSIS — Z23 Encounter for immunization: Secondary | ICD-10-CM | POA: Diagnosis not present

## 2022-01-04 DIAGNOSIS — Z0000 Encounter for general adult medical examination without abnormal findings: Secondary | ICD-10-CM | POA: Insufficient documentation

## 2022-01-04 DIAGNOSIS — Z136 Encounter for screening for cardiovascular disorders: Secondary | ICD-10-CM

## 2022-01-04 DIAGNOSIS — N913 Primary oligomenorrhea: Secondary | ICD-10-CM

## 2022-01-04 LAB — URINALYSIS, ROUTINE W REFLEX MICROSCOPIC
Bilirubin, UA: NEGATIVE
Glucose, UA: NEGATIVE
Ketones, UA: NEGATIVE
Leukocytes,UA: NEGATIVE
Nitrite, UA: NEGATIVE
RBC, UA: NEGATIVE
Specific Gravity, UA: >1.03 — ABNORMAL HIGH (ref 1.005–1.030)
Urobilinogen, Ur: 0.2 mg/dL (ref 0.2–1.0)
pH, UA: 5.5 (ref 5.0–7.5)

## 2022-01-04 MED ORDER — VENLAFAXINE HCL ER 37.5 MG PO CP24: 37.5000 mg | ORAL_CAPSULE | Freq: Every day | ORAL | 0 refills | Status: DC

## 2022-01-04 MED ORDER — VALSARTAN 40 MG PO TABS: 40.0000 mg | ORAL_TABLET | Freq: Every day | ORAL | 0 refills | Status: DC

## 2022-01-04 MED ORDER — LORAZEPAM 0.5 MG PO TABS: 0.5000 mg | ORAL_TABLET | Freq: Two times a day (BID) | ORAL | 0 refills | Status: DC | PRN

## 2022-01-04 MED ORDER — METFORMIN HCL 500 MG PO TABS: 500.0000 mg | ORAL_TABLET | Freq: Two times a day (BID) | ORAL | 1 refills | Status: DC

## 2022-01-04 MED ORDER — PROMETHAZINE HCL 25 MG PO TABS: ORAL_TABLET | ORAL | 2 refills | Status: AC

## 2022-01-04 NOTE — Assessment & Plan Note (Signed)
BMI of 36.62. Recommended eating smaller high protein, low fat meals more frequently and exercising 30 mins a day 5 times a week with a goal of 10-15lb weight loss in the next 3 months. Patient voiced their understanding and motivation to adhere to these recommendations.

## 2022-01-04 NOTE — Assessment & Plan Note (Signed)
Chronic.  Controlled.  Continue with current medication regimen of Metformin 500mg BID.  Labs ordered today.  Return to clinic in 6 months for reevaluation.  Call sooner if concerns arise.  ? ?

## 2022-01-04 NOTE — Assessment & Plan Note (Signed)
Chronic. Not well controlled. Will change from Prozac to Effexor.  Side effects and benefits of medication discussed during visit today.  Refilled Lorazepam.  Discussed risks of taking long term Benzodiazepine use.  Okay with taking it PRN.  Understands not to drive or take for sleep aid. Follow up in 1 month.  Call sooner if concerns arise.

## 2022-01-04 NOTE — Assessment & Plan Note (Signed)
Chronic. Not well controlled. Will change from Prozac to Effexor.  Side effects and benefits of medication discussed during visit today.  Follow up in 1 month.  Call sooner if concerns arise.

## 2022-01-04 NOTE — Assessment & Plan Note (Signed)
Chronic. Not well controlled. Will restart valsartan 40mg  daily.  Labs ordered today. Follow up in 1 month.  Call sooner if concerns arise.

## 2022-01-05 LAB — COMPREHENSIVE METABOLIC PANEL
ALT: 11 IU/L (ref 0–32)
AST: 13 IU/L (ref 0–40)
Albumin/Globulin Ratio: 1.7 (ref 1.2–2.2)
Albumin: 4.2 g/dL (ref 3.9–4.9)
Alkaline Phosphatase: 50 IU/L (ref 44–121)
BUN/Creatinine Ratio: 19 (ref 9–23)
BUN: 11 mg/dL (ref 6–20)
Bilirubin Total: 0.2 mg/dL (ref 0.0–1.2)
CO2: 24 mmol/L (ref 20–29)
Calcium: 8.9 mg/dL (ref 8.7–10.2)
Chloride: 105 mmol/L (ref 96–106)
Creatinine, Ser: 0.59 mg/dL (ref 0.57–1.00)
Globulin, Total: 2.5 g/dL (ref 1.5–4.5)
Glucose: 86 mg/dL (ref 70–99)
Potassium: 3.8 mmol/L (ref 3.5–5.2)
Sodium: 140 mmol/L (ref 134–144)
Total Protein: 6.7 g/dL (ref 6.0–8.5)
eGFR: 120 mL/min/{1.73_m2} (ref >59)

## 2022-01-05 LAB — CBC WITH DIFFERENTIAL/PLATELET
Basophils Absolute: 0 10*3/uL (ref 0.0–0.2)
Basos: 0 %
EOS (ABSOLUTE): 0.1 10*3/uL (ref 0.0–0.4)
Eos: 2 %
Hematocrit: 37.9 % (ref 34.0–46.6)
Hemoglobin: 12.5 g/dL (ref 11.1–15.9)
Immature Grans (Abs): 0 10*3/uL (ref 0.0–0.1)
Immature Granulocytes: 0 %
Lymphocytes Absolute: 2.4 10*3/uL (ref 0.7–3.1)
Lymphs: 41 %
MCH: 29.6 pg (ref 26.6–33.0)
MCHC: 33 g/dL (ref 31.5–35.7)
MCV: 90 fL (ref 79–97)
Monocytes Absolute: 0.5 10*3/uL (ref 0.1–0.9)
Monocytes: 9 %
Neutrophils Absolute: 2.8 10*3/uL (ref 1.4–7.0)
Neutrophils: 48 %
Platelets: 180 10*3/uL (ref 150–450)
RBC: 4.23 x10E6/uL (ref 3.77–5.28)
RDW: 13.1 % (ref 11.7–15.4)
WBC: 5.9 10*3/uL (ref 3.4–10.8)

## 2022-01-05 LAB — HEMOGLOBIN A1C
Est. average glucose Bld gHb Est-mCnc: 131 mg/dL
Hgb A1c MFr Bld: 6.2 % — ABNORMAL HIGH (ref 4.8–5.6)

## 2022-01-05 LAB — LIPID PANEL
Chol/HDL Ratio: 3.5 ratio (ref 0.0–4.4)
Cholesterol, Total: 176 mg/dL (ref 100–199)
HDL: 50 mg/dL (ref >39)
LDL Chol Calc (NIH): 86 mg/dL (ref 0–99)
Triglycerides: 239 mg/dL — ABNORMAL HIGH (ref 0–149)
VLDL Cholesterol Cal: 40 mg/dL (ref 5–40)

## 2022-01-05 LAB — HIV ANTIBODY (ROUTINE TESTING W REFLEX): HIV Screen 4th Generation wRfx: NONREACTIVE

## 2022-01-05 LAB — TSH: TSH: 1.43 u[IU]/mL (ref 0.450–4.500)

## 2022-01-05 NOTE — Progress Notes (Signed)
HI Mandy Stewart. It was nice to see you yesterday.  Your lab work looks good.  Your A1c is well controlled at 6.2.  Keep up the good work.  Triglycerides are high but likely due to not fasting yesterday.  No concerns at this time. Continue with your current medication regimen.  Follow up as discussed.  Please let me know if you have any questions.

## 2022-01-10 LAB — CYTOLOGY - PAP
Comment: NEGATIVE
Diagnosis: UNDETERMINED — AB
High risk HPV: POSITIVE — AB

## 2022-01-11 ENCOUNTER — Encounter: Payer: Self-pay | Admitting: Nurse Practitioner

## 2022-01-11 NOTE — Progress Notes (Signed)
Hi Mandy Stewart. It was good to see you recently.  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  You will need to follow up with GYN for further evaluation and management.  Please let me know if you have any questions.

## 2022-01-26 ENCOUNTER — Other Ambulatory Visit: Payer: Self-pay | Admitting: Nurse Practitioner

## 2022-01-26 NOTE — Telephone Encounter (Signed)
Requested Prescriptions  Pending Prescriptions Disp Refills   venlafaxine XR (EFFEXOR-XR) 37.5 MG 24 hr capsule [Pharmacy Med Name: VENLAFAXINE HCL ER 37.5 MG CAP] 90 capsule 0    Sig: TAKE 1 CAPSULE BY MOUTH DAILY WITH BREAKFAST.     Psychiatry: Antidepressants - SNRI - desvenlafaxine & venlafaxine Failed - 01/26/2022  1:33 PM      Failed - Last BP in normal range    BP Readings from Last 1 Encounters:  01/04/22 (!) 136/90         Failed - Lipid Panel in normal range within the last 12 months    Cholesterol, Total  Date Value Ref Range Status  01/04/2022 176 100 - 199 mg/dL Final   LDL Chol Calc (NIH)  Date Value Ref Range Status  01/04/2022 86 0 - 99 mg/dL Final   HDL  Date Value Ref Range Status  01/04/2022 50 >39 mg/dL Final   Triglycerides  Date Value Ref Range Status  01/04/2022 239 (H) 0 - 149 mg/dL Final         Passed - Cr in normal range and within 360 days    Creatinine, Ser  Date Value Ref Range Status  01/04/2022 0.59 0.57 - 1.00 mg/dL Final         Passed - Completed PHQ-2 or PHQ-9 in the last 360 days      Passed - Valid encounter within last 6 months    Recent Outpatient Visits           3 weeks ago Annual physical exam   [facility] Larae Grooms, NP   8 months ago Anxiety   [facility] Larae Grooms, NP   10 months ago Depression, recurrent [facility])   [facility] Larae Grooms, NP   11 months ago Acute cystitis with hematuria   [facility] Larae Grooms, NP   1 year ago Routine general medical examination at a health care facility   [facility], Jake Church, NP       Future Appointments             In 1 week Larae Grooms, NP [facility]             valsartan (DIOVAN) 40 MG tablet [Pharmacy Med Name: VALSARTAN 40 MG TABLET] 90 tablet 0    Sig: TAKE 1 TABLET BY MOUTH EVERY DAY     Cardiovascular:  Angiotensin Receptor Blockers  Failed - 01/26/2022  1:33 PM      Failed - Last BP in normal range    BP Readings from Last 1 Encounters:  01/04/22 (!) 136/90         Passed - Cr in normal range and within 180 days    Creatinine, Ser  Date Value Ref Range Status  01/04/2022 0.59 0.57 - 1.00 mg/dL Final         Passed - K in normal range and within 180 days    Potassium  Date Value Ref Range Status  01/04/2022 3.8 3.5 - 5.2 mmol/L Final         Passed - Patient is not pregnant      Passed - Valid encounter within last 6 months    Recent Outpatient Visits           3 weeks ago Annual physical exam   [facility] Larae Grooms, NP   8 months ago Anxiety   [facility] Larae Grooms, NP  10 months ago Depression, recurrent [facility])   [facility] Larae Grooms, NP   11 months ago Acute cystitis with hematuria   [facility] Larae Grooms, NP   1 year ago Routine general medical examination at a health care facility   [facility], Jake Church, NP       Future Appointments             In 1 week Larae Grooms, NP [facility]

## 2022-02-08 ENCOUNTER — Ambulatory Visit: Payer: Commercial Managed Care - HMO | Admitting: Nurse Practitioner

## 2022-02-23 ENCOUNTER — Ambulatory Visit: Payer: Commercial Managed Care - HMO | Admitting: Nurse Practitioner

## 2022-04-26 ENCOUNTER — Other Ambulatory Visit: Payer: Self-pay | Admitting: Nurse Practitioner

## 2022-04-26 NOTE — Telephone Encounter (Signed)
Requested medication (s) are due for refill today: yes  Requested medication (s) are on the active medication list: yes  Last refill:  01/26/22 #90 0 refills  Future visit scheduled: no   Notes to clinic:  protocol failed. BP 136/90 01/04/22. Do you want to refill Rx?     Requested Prescriptions  Pending Prescriptions Disp Refills   venlafaxine XR (EFFEXOR-XR) 37.5 MG 24 hr capsule [Pharmacy Med Name: VENLAFAXINE HCL ER 37.5 MG CAP] 30 capsule 2    Sig: TAKE 1 CAPSULE BY MOUTH DAILY WITH BREAKFAST.     Psychiatry: Antidepressants - SNRI - desvenlafaxine & venlafaxine Failed - 04/26/2022  1:50 AM      Failed - Last BP in normal range    BP Readings from Last 1 Encounters:  01/04/22 (!) 136/90         Failed - Lipid Panel in normal range within the last 12 months    Cholesterol, Total  Date Value Ref Range Status  01/04/2022 176 100 - 199 mg/dL Final   LDL Chol Calc (NIH)  Date Value Ref Range Status  01/04/2022 86 0 - 99 mg/dL Final   HDL  Date Value Ref Range Status  01/04/2022 50 >39 mg/dL Final   Triglycerides  Date Value Ref Range Status  01/04/2022 239 (H) 0 - 149 mg/dL Final         Passed - Cr in normal range and within 360 days    Creatinine, Ser  Date Value Ref Range Status  01/04/2022 0.59 0.57 - 1.00 mg/dL Final         Passed - Completed PHQ-2 or PHQ-9 in the last 360 days      Passed - Valid encounter within last 6 months    Recent Outpatient Visits           3 months ago Annual physical exam   [place] Jon Billings, NP   11 months ago [place], NP   1 year ago Depression, recurrent [facility])   [place] Jon Billings, NP   1 year ago Acute cystitis with hematuria   [place] Jon Billings, NP   1 year ago Routine general medical examination at a health care facility   [facility] Charyl Dancer, NP

## 2022-04-26 NOTE — Telephone Encounter (Signed)
Patient is overdue for appointment. Please call to schedule then route to provider for refill.

## 2022-04-27 ENCOUNTER — Other Ambulatory Visit: Payer: Self-pay | Admitting: Nurse Practitioner

## 2022-04-27 ENCOUNTER — Telehealth: Payer: Commercial Managed Care - HMO | Admitting: Nurse Practitioner

## 2022-04-27 DIAGNOSIS — R399 Unspecified symptoms and signs involving the genitourinary system: Secondary | ICD-10-CM

## 2022-04-27 MED ORDER — CEPHALEXIN 500 MG PO CAPS: 500.0000 mg | ORAL_CAPSULE | Freq: Two times a day (BID) | ORAL | 0 refills | Status: DC

## 2022-04-27 NOTE — Progress Notes (Signed)
E-Visit for Urinary Problems  We are sorry that you are not feeling well.  Here is how we plan to help!  Based on what you shared with me it looks like you most likely have a simple urinary tract infection.  A UTI (Urinary Tract Infection) is a bacterial infection of the bladder.  Most cases of urinary tract infections are simple to treat but a key part of your care is to encourage you to drink plenty of fluids and watch your symptoms carefully.  I have prescribed Keflex 500 mg twice a day for 7 days.  Your symptoms should gradually improve. Call us if the burning in your urine worsens, you develop worsening fever, back pain or pelvic pain or if your symptoms do not resolve after completing the antibiotic.  Urinary tract infections can be prevented by drinking plenty of water to keep your body hydrated.  Also be sure when you wipe, wipe from front to back and don't hold it in!  If possible, empty your bladder every 4 hours.  HOME CARE Drink plenty of fluids Compete the full course of the antibiotics even if the symptoms resolve Remember, when you need to go.go. Holding in your urine can increase the likelihood of getting a UTI! GET HELP RIGHT AWAY IF: You cannot urinate You get a high fever Worsening back pain occurs You see blood in your urine You feel sick to your stomach or throw up You feel like you are going to pass out  MAKE SURE YOU  Understand these instructions. Will watch your condition. Will get help right away if you are not doing well or get worse.   Thank you for choosing an e-visit.  Your e-visit answers were reviewed by a board certified advanced clinical practitioner to complete your personal care plan. Depending upon the condition, your plan could have included both over the counter or prescription medications.  Please review your pharmacy choice. Make sure the pharmacy is open so you can pick up prescription now. If there is a problem, you may contact your  provider through [employer] and have the prescription routed to another pharmacy.  Your safety is important to [place]. If you have drug allergies check your prescription carefully.   For the next 24 hours you can use MyChart to ask questions about today's visit, request a non-urgent call back, or ask for a work or school excuse. You will get an email in the next two days asking about your experience. I hope that your e-visit has been valuable and will speed your recovery.   Mary-Margaret Hassell Done, FNP   5-10 minutes spent reviewing and documenting in chart.

## 2022-04-27 NOTE — Telephone Encounter (Signed)
Requested Prescriptions  Pending Prescriptions Disp Refills   valsartan (DIOVAN) 40 MG tablet [Pharmacy Med Name: VALSARTAN 40 MG TABLET] 30 tablet 2    Sig: TAKE 1 TABLET BY MOUTH EVERY DAY     Cardiovascular:  Angiotensin Receptor Blockers Failed - 04/27/2022  2:43 AM      Failed - Last BP in normal range    BP Readings from Last 1 Encounters:  01/04/22 (!) 136/90         Passed - Cr in normal range and within 180 days    Creatinine, Ser  Date Value Ref Range Status  01/04/2022 0.59 0.57 - 1.00 mg/dL Final         Passed - K in normal range and within 180 days    Potassium  Date Value Ref Range Status  01/04/2022 3.8 3.5 - 5.2 mmol/L Final         Passed - Patient is not pregnant      Passed - Valid encounter within last 6 months    Recent Outpatient Visits           3 months ago Annual physical exam   [place] Jon Billings, NP   11 months ago [place], NP   1 year ago Depression, recurrent [facility]   [place] Jon Billings, NP   1 year ago Acute cystitis with hematuria   [place] Jon Billings, NP   1 year ago Routine general medical examination at a health care facility   [facility] Charyl Dancer, NP

## 2022-04-27 NOTE — Telephone Encounter (Signed)
Scheduled pt for March 7th @ 11:00 am, routing to provider for refill.

## 2022-04-28 NOTE — Telephone Encounter (Signed)
Called pt to inform her that she has a 30 day supply of her meds until her appointment.

## 2022-05-04 NOTE — Progress Notes (Unsigned)
There were no vitals taken for this visit.   Subjective:    Patient ID: Mandy Stewart, female    DOB: 11-10-1985, 37 y.o.   MRN: [id number]  HPI: Mandy Stewart is a 37 y.o. female  No chief complaint on file.  DEPRESSION/ANXIETY Patient states she is doing a little bit better. She is taking the Prozac '40mg'$ .  Feels like her emotions have improved some.  Denies concerns at visit today.    [facility] Visit from 01/04/2022 in [place]  PHQ-9 Total Score 5         01/04/2022    3:04 PM 05/10/2021    4:06 PM 03/11/2021    3:09 PM 02/08/2021    4:03 PM  GAD 7 : Generalized Anxiety Score  Nervous, Anxious, on Edge '2 1 2 2  '$ Control/stop worrying '2 2 3 2  '$ Worry too much - different things '3 2 3 1  '$ Trouble relaxing '1 1 2 1  '$ Restless 1 0 2 1  Easily annoyed or irritable '2 1 1 1  '$ Afraid - awful might happen '3 2 3 2  '$ Total GAD 7 Score '14 9 16 10  '$ Anxiety Difficulty Very difficult Somewhat difficult Somewhat difficult Somewhat difficult   HYPERTENSION {Blank single:19197::"without","with"} Chronic Kidney Disease Hypertension status: {Blank single:19197::"controlled","uncontrolled","better","worse","exacerbated","stable"}  Satisfied with current treatment? {Blank single:19197::"yes","no"} Duration of hypertension: {Blank single:19197::"chronic","months","years"} BP monitoring frequency:  {Blank single:19197::"not checking","rarely","daily","weekly","monthly","a few times a day","a few times a week","a few times a month"} BP range:  BP medication side effects:  {Blank single:19197::"yes","no"} Medication compliance: {Blank single:19197::"excellent compliance","good compliance","fair compliance","poor compliance"} Previous BP meds:{Blank [id number] (bystolic)","carvedilol","chlorthalidone","clonidine","diltiazem","exforge HCT","HCTZ","irbesartan  (avapro)","labetalol","lisinopril","lisinopril-HCTZ","losartan (cozaar)","methyldopa","nifedipine","olmesartan (benicar)","olmesartan-HCTZ","quinapril","ramipril","spironalactone","tekturna","valsartan","valsartan-HCTZ","verapamil"} Aspirin: {Blank single:19197::"yes","no"} Recurrent headaches: {Blank single:19197::"yes","no"} Visual changes: {Blank single:19197::"yes","no"} Palpitations: {Blank single:19197::"yes","no"} Dyspnea: {Blank single:19197::"yes","no"} Chest pain: {Blank single:19197::"yes","no"} Lower extremity edema: {Blank single:19197::"yes","no"} Dizzy/lightheaded: {Blank single:19197::"yes","no"}   Relevant past medical, surgical, family and social history reviewed and updated as indicated. Interim medical history since our last visit reviewed. Allergies and medications reviewed and updated.  Review of Systems  Psychiatric/Behavioral:  Positive for dysphoric mood. Negative for suicidal ideas. The patient is nervous/anxious.     Per HPI unless specifically indicated above     Objective:    There were no vitals taken for this visit.  Wt Readings from Last 3 Encounters:  01/04/22 202 lb 3.2 oz (91.7 kg)  12/08/21 185 lb (83.9 kg)  05/10/21 180 lb (81.6 kg)    Physical Exam Vitals and nursing note reviewed.  Constitutional:      General: She is not in acute distress.    Appearance: Normal appearance. She is normal weight. She is not ill-appearing, toxic-appearing or diaphoretic.  HENT:     Head: Normocephalic.     Right Ear: External ear normal.     Left Ear: External ear normal.     Nose: Nose normal.     Mouth/Throat:     Mouth: Mucous membranes are moist.     Pharynx: Oropharynx is clear.  Eyes:     General:        Right eye: No discharge.        Left eye: No discharge.     Extraocular Movements: Extraocular movements intact.     Conjunctiva/sclera: Conjunctivae normal.     Pupils: Pupils are equal, round, and reactive to light.  Cardiovascular:      Rate and Rhythm: Normal rate and regular rhythm.     Heart sounds: No murmur heard. Pulmonary:     Effort:  Pulmonary effort is normal. No respiratory distress.     Breath sounds: Normal breath sounds. No wheezing or rales.  Abdominal:     General: Abdomen is flat. Bowel sounds are normal. There is no distension.     Palpations: Abdomen is soft.     Tenderness: There is abdominal tenderness. There is no right CVA tenderness, left CVA tenderness or guarding.  Musculoskeletal:     Cervical back: Normal range of motion and neck supple.  Skin:    General: Skin is warm and dry.     Capillary Refill: Capillary refill takes less than 2 seconds.  Neurological:     General: No focal deficit present.     Mental Status: She is alert and oriented to person, place, and time. Mental status is at baseline.  Psychiatric:        Mood and Affect: Mood normal.        Behavior: Behavior normal.        Thought Content: Thought content normal.        Judgment: Judgment normal.     Results for orders placed or performed in visit on 01/04/22  CBC with Differential/Platelet  Result Value Ref Range   WBC 5.9 3.4 - 10.8 x10E3/uL   RBC 4.23 3.77 - 5.28 x10E6/uL   Hemoglobin 12.5 11.1 - 15.9 g/dL   Hematocrit 37.9 34.0 - 46.6 %   MCV 90 79 - 97 fL   MCH 29.6 26.6 - 33.0 pg   MCHC 33.0 31.5 - 35.7 g/dL   RDW 13.1 11.7 - 15.4 %   Platelets 180 150 - 450 x10E3/uL   Neutrophils 48 Not Estab. %   Lymphs 41 Not Estab. %   Monocytes 9 Not Estab. %   Eos 2 Not Estab. %   Basos 0 Not Estab. %   Neutrophils Absolute 2.8 1.4 - 7.0 x10E3/uL   Lymphocytes Absolute 2.4 0.7 - 3.1 x10E3/uL   Monocytes Absolute 0.5 0.1 - 0.9 x10E3/uL   EOS (ABSOLUTE) 0.1 0.0 - 0.4 x10E3/uL   Basophils Absolute 0.0 0.0 - 0.2 x10E3/uL   Immature Granulocytes 0 Not Estab. %   Immature Grans (Abs) 0.0 0.0 - 0.1 x10E3/uL  Comprehensive metabolic panel  Result Value Ref Range   Glucose 86 70 - 99 mg/dL   BUN 11 6 - 20 mg/dL    Creatinine, Ser 0.59 0.57 - 1.00 mg/dL   eGFR 120 >59 mL/min/1.73   BUN/Creatinine Ratio 19 9 - 23   Sodium 140 134 - 144 mmol/L   Potassium 3.8 3.5 - 5.2 mmol/L   Chloride 105 96 - 106 mmol/L   CO2 24 20 - 29 mmol/L   Calcium 8.9 8.7 - 10.2 mg/dL   Total Protein 6.7 6.0 - 8.5 g/dL   Albumin 4.2 3.9 - 4.9 g/dL   Globulin, Total 2.5 1.5 - 4.5 g/dL   Albumin/Globulin Ratio 1.7 1.2 - 2.2   Bilirubin Total 0.2 0.0 - 1.2 mg/dL   Alkaline Phosphatase 50 44 - 121 IU/L   AST 13 0 - 40 IU/L   ALT 11 0 - 32 IU/L  Lipid panel  Result Value Ref Range   Cholesterol, Total 176 100 - 199 mg/dL   Triglycerides 239 (H) 0 - 149 mg/dL   HDL 50 >39 mg/dL   VLDL Cholesterol Cal 40 5 - 40 mg/dL   LDL Chol Calc (NIH) 86 0 - 99 mg/dL   Chol/HDL Ratio 3.5 0.0 - 4.4 ratio  TSH  Result Value  Ref Range   TSH 1.430 0.450 - 4.500 uIU/mL  Urinalysis, Routine w reflex microscopic  Result Value Ref Range   Specific Gravity, UA >1.030 (H) 1.005 - 1.030   pH, UA 5.5 5.0 - 7.5   Color, UA Yellow Yellow   Appearance Ur Clear Clear   Leukocytes,UA Negative Negative   Protein,UA Trace (A) Negative/Trace   Glucose, UA Negative Negative   Ketones, UA Negative Negative   RBC, UA Negative Negative   Bilirubin, UA Negative Negative   Urobilinogen, Ur 0.2 0.2 - 1.0 mg/dL   Nitrite, UA Negative Negative   Microscopic Examination Comment   HIV Antibody (routine testing w rflx)  Result Value Ref Range   HIV Screen 4th Generation wRfx Non Reactive Non Reactive  HgB A1c  Result Value Ref Range   Hgb A1c MFr Bld 6.2 (H) 4.8 - 5.6 %   Est. average glucose Bld gHb Est-mCnc 131 mg/dL  Cytology - PAP  Result Value Ref Range   High risk HPV Positive (A)    Adequacy      Satisfactory for evaluation; transformation zone component PRESENT.   Diagnosis (A)     - Atypical squamous cells of undetermined significance (ASC-US)   Comment Normal Reference Range HPV - Negative       Assessment & Plan:   Problem List Items  Addressed This Visit       Cardiovascular and Mediastinum   Primary hypertension - Primary     Other   Anxiety   Depression, recurrent ([place])   Prediabetes   BMI 30.0-30.9,adult     Follow up plan: No follow-ups on file.

## 2022-05-05 ENCOUNTER — Encounter: Payer: Self-pay | Admitting: Nurse Practitioner

## 2022-05-05 ENCOUNTER — Ambulatory Visit (INDEPENDENT_AMBULATORY_CARE_PROVIDER_SITE_OTHER): Payer: Commercial Managed Care - HMO | Admitting: Nurse Practitioner

## 2022-05-05 VITALS — BP 119/80 | HR 66 | Temp 98.2°F | Wt 197.5 lb

## 2022-05-05 DIAGNOSIS — I10 Essential (primary) hypertension: Secondary | ICD-10-CM

## 2022-05-05 DIAGNOSIS — Z6830 Body mass index (BMI) 30.0-30.9, adult: Secondary | ICD-10-CM

## 2022-05-05 DIAGNOSIS — F339 Major depressive disorder, recurrent, unspecified: Secondary | ICD-10-CM | POA: Diagnosis not present

## 2022-05-05 DIAGNOSIS — F419 Anxiety disorder, unspecified: Secondary | ICD-10-CM | POA: Diagnosis not present

## 2022-05-05 DIAGNOSIS — R7303 Prediabetes: Secondary | ICD-10-CM | POA: Diagnosis not present

## 2022-05-05 MED ORDER — METFORMIN HCL 500 MG PO TABS: 500.0000 mg | ORAL_TABLET | Freq: Two times a day (BID) | ORAL | 1 refills | Status: DC

## 2022-05-05 MED ORDER — VALSARTAN 40 MG PO TABS: 40.0000 mg | ORAL_TABLET | Freq: Every day | ORAL | 1 refills | Status: DC

## 2022-05-05 MED ORDER — VENLAFAXINE HCL ER 37.5 MG PO CP24: 37.5000 mg | ORAL_CAPSULE | Freq: Every day | ORAL | 1 refills | Status: DC

## 2022-05-05 NOTE — Assessment & Plan Note (Signed)
Chronic.  Controlled.  Continue with current medication regimen of Valsartan.  Labs ordered today.  Return to clinic in 6 months for reevaluation.  Call sooner if concerns arise.

## 2022-05-05 NOTE — Assessment & Plan Note (Signed)
Chronic.  Controlled.  Continue with current medication regimen of Effexor 37.'5mg'$ .  Labs ordered today.  Return to clinic in 6 months for reevaluation.  Call sooner if concerns arise.

## 2022-05-05 NOTE — Progress Notes (Signed)
BP 119/80   Pulse 66   Temp 98.2 F (36.8 C) (Oral)   Wt 197 lb 8 oz (89.6 kg)   SpO2 98%   BMI 35.77 kg/m    Subjective:    Patient ID: Mandy Stewart, female    DOB: 02-Aug-1985, 37 y.o.   MRN: [id number]  HPI: Mandy Stewart is a 37 y.o. female  Chief Complaint  Patient presents with   Anxiety   Depression   Hypertension   DEPRESSION/ANXIETY Patient states she is doing better.  She is taking the Effexor 37.'5mg'$ .  Feels like her medications are working well.  Denies concerns at visit today.    [facility] Visit from 05/05/2022 in [facility]  PHQ-9 Total Score 3         05/05/2022   11:04 AM 01/04/2022    3:04 PM 05/10/2021    4:06 PM 03/11/2021    3:09 PM  GAD 7 : Generalized Anxiety Score  Nervous, Anxious, on Edge '2 2 1 2  '$ Control/stop worrying '3 2 2 3  '$ Worry too much - different things '3 3 2 3  '$ Trouble relaxing '1 1 1 2  '$ Restless 1 1 0 2  Easily annoyed or irritable '3 2 1 1  '$ Afraid - awful might happen '3 3 2 3  '$ Total GAD 7 Score '16 14 9 16  '$ Anxiety Difficulty Very difficult Very difficult Somewhat difficult Somewhat difficult   HYPERTENSION with Chronic Kidney Disease Hypertension status: controlled  Satisfied with current treatment? yes Duration of hypertension: years BP monitoring frequency:  not checking BP range:  BP medication side effects:  no Medication compliance: excellent compliance Previous BP meds:valsartan Aspirin: no Recurrent headaches: no Visual changes: no Palpitations: no Dyspnea: no Chest pain: no Lower extremity edema: no Dizzy/lightheaded: no   Relevant past medical, surgical, family and social history reviewed and updated as indicated. Interim medical history since our last visit reviewed. Allergies and medications reviewed and updated.  Review of Systems  Psychiatric/Behavioral:  Positive for dysphoric mood. Negative for suicidal ideas. The patient is nervous/anxious.     Per HPI unless  specifically indicated above     Objective:    BP 119/80   Pulse 66   Temp 98.2 F (36.8 C) (Oral)   Wt 197 lb 8 oz (89.6 kg)   SpO2 98%   BMI 35.77 kg/m   Wt Readings from Last 3 Encounters:  05/05/22 197 lb 8 oz (89.6 kg)  01/04/22 202 lb 3.2 oz (91.7 kg)  12/08/21 185 lb (83.9 kg)    Physical Exam Vitals and nursing note reviewed.  Constitutional:      General: She is not in acute distress.    Appearance: Normal appearance. She is normal weight. She is not ill-appearing, toxic-appearing or diaphoretic.  HENT:     Head: Normocephalic.     Right Ear: External ear normal.     Left Ear: External ear normal.     Nose: Nose normal.     Mouth/Throat:     Mouth: Mucous membranes are moist.     Pharynx: Oropharynx is clear.  Eyes:     General:        Right eye: No discharge.        Left eye: No discharge.     Extraocular Movements: Extraocular movements intact.     Conjunctiva/sclera: Conjunctivae normal.     Pupils: Pupils are equal, round, and reactive to light.  Cardiovascular:  Rate and Rhythm: Normal rate and regular rhythm.     Heart sounds: No murmur heard. Pulmonary:     Effort: Pulmonary effort is normal. No respiratory distress.     Breath sounds: Normal breath sounds. No wheezing or rales.  Musculoskeletal:     Cervical back: Normal range of motion and neck supple.  Skin:    General: Skin is warm and dry.     Capillary Refill: Capillary refill takes less than 2 seconds.  Neurological:     General: No focal deficit present.     Mental Status: She is alert and oriented to person, place, and time. Mental status is at baseline.  Psychiatric:        Mood and Affect: Mood normal.        Behavior: Behavior normal.        Thought Content: Thought content normal.        Judgment: Judgment normal.     Results for orders placed or performed in visit on 01/04/22  CBC with Differential/Platelet  Result Value Ref Range   WBC 5.9 3.4 - 10.8 x10E3/uL   RBC 4.23  3.77 - 5.28 x10E6/uL   Hemoglobin 12.5 11.1 - 15.9 g/dL   Hematocrit 37.9 34.0 - 46.6 %   MCV 90 79 - 97 fL   MCH 29.6 26.6 - 33.0 pg   MCHC 33.0 31.5 - 35.7 g/dL   RDW 13.1 11.7 - 15.4 %   Platelets 180 150 - 450 x10E3/uL   Neutrophils 48 Not Estab. %   Lymphs 41 Not Estab. %   Monocytes 9 Not Estab. %   Eos 2 Not Estab. %   Basos 0 Not Estab. %   Neutrophils Absolute 2.8 1.4 - 7.0 x10E3/uL   Lymphocytes Absolute 2.4 0.7 - 3.1 x10E3/uL   Monocytes Absolute 0.5 0.1 - 0.9 x10E3/uL   EOS (ABSOLUTE) 0.1 0.0 - 0.4 x10E3/uL   Basophils Absolute 0.0 0.0 - 0.2 x10E3/uL   Immature Granulocytes 0 Not Estab. %   Immature Grans (Abs) 0.0 0.0 - 0.1 x10E3/uL  Comprehensive metabolic panel  Result Value Ref Range   Glucose 86 70 - 99 mg/dL   BUN 11 6 - 20 mg/dL   Creatinine, Ser 0.59 0.57 - 1.00 mg/dL   eGFR 120 >59 mL/min/1.73   BUN/Creatinine Ratio 19 9 - 23   Sodium 140 134 - 144 mmol/L   Potassium 3.8 3.5 - 5.2 mmol/L   Chloride 105 96 - 106 mmol/L   CO2 24 20 - 29 mmol/L   Calcium 8.9 8.7 - 10.2 mg/dL   Total Protein 6.7 6.0 - 8.5 g/dL   Albumin 4.2 3.9 - 4.9 g/dL   Globulin, Total 2.5 1.5 - 4.5 g/dL   Albumin/Globulin Ratio 1.7 1.2 - 2.2   Bilirubin Total 0.2 0.0 - 1.2 mg/dL   Alkaline Phosphatase 50 44 - 121 IU/L   AST 13 0 - 40 IU/L   ALT 11 0 - 32 IU/L  Lipid panel  Result Value Ref Range   Cholesterol, Total 176 100 - 199 mg/dL   Triglycerides 239 (H) 0 - 149 mg/dL   HDL 50 >39 mg/dL   VLDL Cholesterol Cal 40 5 - 40 mg/dL   LDL Chol Calc (NIH) 86 0 - 99 mg/dL   Chol/HDL Ratio 3.5 0.0 - 4.4 ratio  TSH  Result Value Ref Range   TSH 1.430 0.450 - 4.500 uIU/mL  Urinalysis, Routine w reflex microscopic  Result Value Ref Range  Specific Gravity, UA >1.030 (H) 1.005 - 1.030   pH, UA 5.5 5.0 - 7.5   Color, UA Yellow Yellow   Appearance Ur Clear Clear   Leukocytes,UA Negative Negative   Protein,UA Trace (A) Negative/Trace   Glucose, UA Negative Negative   Ketones, UA  Negative Negative   RBC, UA Negative Negative   Bilirubin, UA Negative Negative   Urobilinogen, Ur 0.2 0.2 - 1.0 mg/dL   Nitrite, UA Negative Negative   Microscopic Examination Comment   HIV Antibody (routine testing w rflx)  Result Value Ref Range   HIV Screen 4th Generation wRfx Non Reactive Non Reactive  HgB A1c  Result Value Ref Range   Hgb A1c MFr Bld 6.2 (H) 4.8 - 5.6 %   Est. average glucose Bld gHb Est-mCnc 131 mg/dL  Cytology - PAP  Result Value Ref Range   High risk HPV Positive (A)    Adequacy      Satisfactory for evaluation; transformation zone component PRESENT.   Diagnosis (A)     - Atypical squamous cells of undetermined significance (ASC-US)   Comment Normal Reference Range HPV - Negative       Assessment & Plan:   Problem List Items Addressed This Visit       Cardiovascular and Mediastinum   Primary hypertension    Chronic.  Controlled.  Continue with current medication regimen of Valsartan.  Labs ordered today.  Return to clinic in 6 months for reevaluation.  Call sooner if concerns arise.        Relevant Medications   valsartan (DIOVAN) 40 MG tablet   Other Relevant Orders   Comp Met (CMET)     Other   Anxiety    Chronic.  Controlled.  Continue with current medication regimen of Effexor 37.'5mg'$ .  Labs ordered today.  Return to clinic in 6 months for reevaluation.  Call sooner if concerns arise.       Relevant Medications   venlafaxine XR (EFFEXOR-XR) 37.5 MG 24 hr capsule   Depression, recurrent (HCC) - Primary    Chronic.  Controlled.  Continue with current medication regimen of Effexor 37.'5mg'$ .  Labs ordered today.  Return to clinic in 6 months for reevaluation.  Call sooner if concerns arise.        Relevant Medications   venlafaxine XR (EFFEXOR-XR) 37.5 MG 24 hr capsule   Prediabetes    Labs ordered at visit today.  Will make recommendations based on lab results.        Relevant Orders   HgB A1c     Follow up plan: Return in about 6  months (around 11/05/2022) for Physical and Fasting labs.

## 2022-05-05 NOTE — Assessment & Plan Note (Signed)
Labs ordered at visit today.  Will make recommendations based on lab results.   

## 2022-05-06 LAB — COMPREHENSIVE METABOLIC PANEL
ALT: 12 IU/L (ref 0–32)
AST: 14 IU/L (ref 0–40)
Albumin/Globulin Ratio: 1.5 (ref 1.2–2.2)
Albumin: 4.1 g/dL (ref 3.9–4.9)
Alkaline Phosphatase: 57 IU/L (ref 44–121)
BUN/Creatinine Ratio: 21 (ref 9–23)
BUN: 14 mg/dL (ref 6–20)
Bilirubin Total: 0.3 mg/dL (ref 0.0–1.2)
CO2: 20 mmol/L (ref 20–29)
Calcium: 9.3 mg/dL (ref 8.7–10.2)
Chloride: 107 mmol/L — ABNORMAL HIGH (ref 96–106)
Creatinine, Ser: 0.67 mg/dL (ref 0.57–1.00)
Globulin, Total: 2.8 g/dL (ref 1.5–4.5)
Glucose: 82 mg/dL (ref 70–99)
Potassium: 4 mmol/L (ref 3.5–5.2)
Sodium: 143 mmol/L (ref 134–144)
Total Protein: 6.9 g/dL (ref 6.0–8.5)
eGFR: 116 mL/min/{1.73_m2} (ref >59)

## 2022-05-06 LAB — HEMOGLOBIN A1C
Est. average glucose Bld gHb Est-mCnc: 131 mg/dL
Hgb A1c MFr Bld: 6.2 % — ABNORMAL HIGH (ref 4.8–5.6)

## 2022-05-06 NOTE — Progress Notes (Signed)
HI Mandy Stewart. It was nice to see you yesterday.  Your lab work looks good.  No concerns at this time. Continue with your current medication regimen.  Follow up as discussed.  Please let me know if you have any questions.

## 2022-05-26 ENCOUNTER — Encounter: Payer: Self-pay | Admitting: Nurse Practitioner

## 2022-06-16 ENCOUNTER — Other Ambulatory Visit: Payer: Self-pay

## 2022-06-16 ENCOUNTER — Emergency Department: Payer: BC Managed Care – PPO

## 2022-06-16 DIAGNOSIS — M25511 Pain in right shoulder: Secondary | ICD-10-CM | POA: Diagnosis not present

## 2022-06-16 DIAGNOSIS — R079 Chest pain, unspecified: Secondary | ICD-10-CM | POA: Insufficient documentation

## 2022-06-16 DIAGNOSIS — S1081XA Abrasion of other specified part of neck, initial encounter: Secondary | ICD-10-CM | POA: Diagnosis not present

## 2022-06-16 DIAGNOSIS — S4992XA Unspecified injury of left shoulder and upper arm, initial encounter: Secondary | ICD-10-CM | POA: Diagnosis not present

## 2022-06-16 DIAGNOSIS — M533 Sacrococcygeal disorders, not elsewhere classified: Secondary | ICD-10-CM | POA: Diagnosis not present

## 2022-06-16 DIAGNOSIS — R109 Unspecified abdominal pain: Secondary | ICD-10-CM | POA: Insufficient documentation

## 2022-06-16 DIAGNOSIS — S30811A Abrasion of abdominal wall, initial encounter: Secondary | ICD-10-CM | POA: Diagnosis not present

## 2022-06-16 DIAGNOSIS — Y92410 Unspecified street and highway as the place of occurrence of the external cause: Secondary | ICD-10-CM | POA: Insufficient documentation

## 2022-06-16 DIAGNOSIS — S60211A Contusion of right wrist, initial encounter: Secondary | ICD-10-CM | POA: Diagnosis not present

## 2022-06-16 DIAGNOSIS — S6991XA Unspecified injury of right wrist, hand and finger(s), initial encounter: Secondary | ICD-10-CM | POA: Diagnosis not present

## 2022-06-16 DIAGNOSIS — S299XXA Unspecified injury of thorax, initial encounter: Secondary | ICD-10-CM | POA: Diagnosis not present

## 2022-06-16 DIAGNOSIS — Z041 Encounter for examination and observation following transport accident: Secondary | ICD-10-CM | POA: Diagnosis not present

## 2022-06-16 DIAGNOSIS — S0990XA Unspecified injury of head, initial encounter: Secondary | ICD-10-CM | POA: Insufficient documentation

## 2022-06-16 DIAGNOSIS — S40012A Contusion of left shoulder, initial encounter: Secondary | ICD-10-CM | POA: Diagnosis not present

## 2022-06-16 LAB — URINALYSIS, ROUTINE W REFLEX MICROSCOPIC
Bilirubin Urine: NEGATIVE
Glucose, UA: NEGATIVE mg/dL
Hgb urine dipstick: NEGATIVE
Ketones, ur: NEGATIVE mg/dL
Nitrite: NEGATIVE
Protein, ur: NEGATIVE mg/dL
Specific Gravity, Urine: 1.025 (ref 1.005–1.030)
pH: 6 (ref 5.0–8.0)

## 2022-06-16 LAB — COMPREHENSIVE METABOLIC PANEL
ALT: 12 U/L (ref 0–44)
AST: 19 U/L (ref 15–41)
Albumin: 4 g/dL (ref 3.5–5.0)
Alkaline Phosphatase: 51 U/L (ref 38–126)
Anion gap: 8 (ref 5–15)
BUN: 17 mg/dL (ref 6–20)
CO2: 23 mmol/L (ref 22–32)
Calcium: 8.8 mg/dL — ABNORMAL LOW (ref 8.9–10.3)
Chloride: 107 mmol/L (ref 98–111)
Creatinine, Ser: 0.69 mg/dL (ref 0.44–1.00)
GFR, Estimated: >60 mL/min (ref >60)
Glucose, Bld: 137 mg/dL — ABNORMAL HIGH (ref 70–99)
Potassium: 4.2 mmol/L (ref 3.5–5.1)
Sodium: 138 mmol/L (ref 135–145)
Total Bilirubin: 0.5 mg/dL (ref 0.3–1.2)
Total Protein: 7.4 g/dL (ref 6.5–8.1)

## 2022-06-16 LAB — CBC WITH DIFFERENTIAL/PLATELET
Abs Immature Granulocytes: 0.02 10*3/uL (ref 0.00–0.07)
Basophils Absolute: 0 10*3/uL (ref 0.0–0.1)
Basophils Relative: 0 %
Eosinophils Absolute: 0.1 10*3/uL (ref 0.0–0.5)
Eosinophils Relative: 1 %
HCT: 40.5 % (ref 36.0–46.0)
Hemoglobin: 13.5 g/dL (ref 12.0–15.0)
Immature Granulocytes: 0 %
Lymphocytes Relative: 32 %
Lymphs Abs: 2 10*3/uL (ref 0.7–4.0)
MCH: 30.1 pg (ref 26.0–34.0)
MCHC: 33.3 g/dL (ref 30.0–36.0)
MCV: 90.2 fL (ref 80.0–100.0)
Monocytes Absolute: 0.4 10*3/uL (ref 0.1–1.0)
Monocytes Relative: 6 %
Neutro Abs: 3.6 10*3/uL (ref 1.7–7.7)
Neutrophils Relative %: 61 %
Platelets: 222 10*3/uL (ref 150–400)
RBC: 4.49 MIL/uL (ref 3.87–5.11)
RDW: 12.9 % (ref 11.5–15.5)
WBC: 6.1 10*3/uL (ref 4.0–10.5)
nRBC: 0 % (ref 0.0–0.2)

## 2022-06-16 LAB — HCG, QUANTITATIVE, PREGNANCY: hCG, Beta Chain, Quant, S: 1 m[IU]/mL (ref <5)

## 2022-06-16 LAB — POC URINE PREG, ED: Preg Test, Ur: NEGATIVE

## 2022-06-16 MED ORDER — IOHEXOL 300 MG/ML  SOLN
100.0000 mL | Freq: Once | INTRAMUSCULAR | Status: AC | PRN
  Administered 2022-06-16: 100 mL via INTRAVENOUS

## 2022-06-16 MED ORDER — ONDANSETRON HCL 4 MG/2ML IJ SOLN
4.0000 mg | Freq: Once | INTRAMUSCULAR | Status: AC
  Administered 2022-06-16: 4 mg via INTRAVENOUS
  Filled 2022-06-16: qty 2

## 2022-06-16 MED ORDER — FENTANYL CITRATE PF 50 MCG/ML IJ SOSY
50.0000 ug | PREFILLED_SYRINGE | INTRAMUSCULAR | Status: DC | PRN
  Administered 2022-06-16: 50 ug via INTRAVENOUS
  Filled 2022-06-16: qty 1

## 2022-06-16 NOTE — ED Notes (Signed)
Patient transported to X-ray 
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
## 2022-06-17 ENCOUNTER — Emergency Department
Admission: EM | Admit: 2022-06-17 | Discharge: 2022-06-17 | Disposition: A | Discharge status: Home / Self Care | Payer: BC Managed Care – PPO | Attending: Emergency Medicine | Admitting: Emergency Medicine

## 2022-06-17 DIAGNOSIS — T07XXXA Unspecified multiple injuries, initial encounter: Secondary | ICD-10-CM

## 2022-06-17 MED ORDER — ONDANSETRON HCL 4 MG/2ML IJ SOLN
4.0000 mg | Freq: Once | INTRAMUSCULAR | Status: AC
  Administered 2022-06-17: 4 mg via INTRAVENOUS
  Filled 2022-06-17: qty 2

## 2022-06-17 MED ORDER — ONDANSETRON 4 MG PO TBDP: 4.0000 mg | ORAL_TABLET | Freq: Three times a day (TID) | ORAL | 0 refills | Status: DC | PRN

## 2022-06-17 MED ORDER — IBUPROFEN 800 MG PO TABS: 800.0000 mg | ORAL_TABLET | Freq: Three times a day (TID) | ORAL | 0 refills | Status: DC | PRN

## 2022-06-17 MED ORDER — MORPHINE SULFATE (PF) 4 MG/ML IV SOLN
4.0000 mg | Freq: Once | INTRAVENOUS | Status: AC
  Administered 2022-06-17: 4 mg via INTRAVENOUS
  Filled 2022-06-17: qty 1

## 2022-06-17 MED ORDER — HYDROCODONE-ACETAMINOPHEN 5-325 MG PO TABS: 2.0000 | ORAL_TABLET | Freq: Four times a day (QID) | ORAL | 0 refills | Status: DC | PRN

## 2022-06-17 MED ORDER — KETOROLAC TROMETHAMINE 30 MG/ML IJ SOLN
30.0000 mg | Freq: Once | INTRAMUSCULAR | Status: AC
  Administered 2022-06-17: 30 mg via INTRAVENOUS
  Filled 2022-06-17: qty 1
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
## 2022-06-17 NOTE — Discharge Instructions (Addendum)

## 2022-06-19 ENCOUNTER — Other Ambulatory Visit: Payer: Self-pay

## 2022-06-19 ENCOUNTER — Emergency Department (HOSPITAL_COMMUNITY)
Admission: EM | Admit: 2022-06-19 | Discharge: 2022-06-20 | Disposition: A | Discharge status: Home / Self Care | Payer: BC Managed Care – PPO | Attending: Emergency Medicine | Admitting: Emergency Medicine

## 2022-06-19 ENCOUNTER — Encounter (HOSPITAL_COMMUNITY): Payer: Self-pay | Admitting: Emergency Medicine

## 2022-06-19 DIAGNOSIS — Z0389 Encounter for observation for other suspected diseases and conditions ruled out: Secondary | ICD-10-CM | POA: Diagnosis not present

## 2022-06-19 DIAGNOSIS — M545 Low back pain, unspecified: Secondary | ICD-10-CM | POA: Insufficient documentation

## 2022-06-19 DIAGNOSIS — S301XXA Contusion of abdominal wall, initial encounter: Secondary | ICD-10-CM | POA: Diagnosis not present

## 2022-06-19 DIAGNOSIS — R079 Chest pain, unspecified: Secondary | ICD-10-CM | POA: Diagnosis not present

## 2022-06-19 DIAGNOSIS — Y92410 Unspecified street and highway as the place of occurrence of the external cause: Secondary | ICD-10-CM | POA: Diagnosis not present

## 2022-06-19 DIAGNOSIS — M791 Myalgia, unspecified site: Secondary | ICD-10-CM | POA: Diagnosis not present

## 2022-06-19 DIAGNOSIS — R222 Localized swelling, mass and lump, trunk: Secondary | ICD-10-CM | POA: Diagnosis not present

## 2022-06-19 DIAGNOSIS — M7918 Myalgia, other site: Secondary | ICD-10-CM | POA: Diagnosis not present

## 2022-06-19 HISTORY — DX: Prediabetes: R73.03

## 2022-06-19 HISTORY — DX: Essential (primary) hypertension: I10

## 2022-06-19 LAB — CBC WITH DIFFERENTIAL/PLATELET
Abs Immature Granulocytes: 0.01 10*3/uL (ref 0.00–0.07)
Basophils Absolute: 0 10*3/uL (ref 0.0–0.1)
Basophils Relative: 0 %
Eosinophils Absolute: 0.1 10*3/uL (ref 0.0–0.5)
Eosinophils Relative: 1 %
HCT: 39.5 % (ref 36.0–46.0)
Hemoglobin: 13 g/dL (ref 12.0–15.0)
Immature Granulocytes: 0 %
Lymphocytes Relative: 47 %
Lymphs Abs: 2.3 10*3/uL (ref 0.7–4.0)
MCH: 30.2 pg (ref 26.0–34.0)
MCHC: 32.9 g/dL (ref 30.0–36.0)
MCV: 91.9 fL (ref 80.0–100.0)
Monocytes Absolute: 0.4 10*3/uL (ref 0.1–1.0)
Monocytes Relative: 8 %
Neutro Abs: 2.3 10*3/uL (ref 1.7–7.7)
Neutrophils Relative %: 44 %
Platelets: 235 10*3/uL (ref 150–400)
RBC: 4.3 MIL/uL (ref 3.87–5.11)
RDW: 12.7 % (ref 11.5–15.5)
WBC: 5.1 10*3/uL (ref 4.0–10.5)
nRBC: 0 % (ref 0.0–0.2)

## 2022-06-19 MED ORDER — ONDANSETRON HCL 4 MG/2ML IJ SOLN
4.0000 mg | Freq: Once | INTRAMUSCULAR | Status: AC
  Administered 2022-06-19: 4 mg via INTRAVENOUS
  Filled 2022-06-19: qty 2

## 2022-06-19 MED ORDER — FENTANYL CITRATE PF 50 MCG/ML IJ SOSY
50.0000 ug | PREFILLED_SYRINGE | Freq: Once | INTRAMUSCULAR | Status: AC
  Administered 2022-06-19: 50 ug via INTRAVENOUS
  Filled 2022-06-19: qty 1
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
## 2022-06-20 ENCOUNTER — Emergency Department (HOSPITAL_COMMUNITY): Payer: BC Managed Care – PPO

## 2022-06-20 DIAGNOSIS — R222 Localized swelling, mass and lump, trunk: Secondary | ICD-10-CM | POA: Diagnosis not present

## 2022-06-20 DIAGNOSIS — S301XXA Contusion of abdominal wall, initial encounter: Secondary | ICD-10-CM | POA: Diagnosis not present

## 2022-06-20 DIAGNOSIS — Z0389 Encounter for observation for other suspected diseases and conditions ruled out: Secondary | ICD-10-CM | POA: Diagnosis not present

## 2022-06-20 LAB — URINALYSIS, ROUTINE W REFLEX MICROSCOPIC
Bilirubin Urine: NEGATIVE
Glucose, UA: NEGATIVE mg/dL
Hgb urine dipstick: NEGATIVE
Ketones, ur: NEGATIVE mg/dL
Nitrite: NEGATIVE
Protein, ur: NEGATIVE mg/dL
Specific Gravity, Urine: 1.018 (ref 1.005–1.030)
pH: 7 (ref 5.0–8.0)

## 2022-06-20 LAB — COMPREHENSIVE METABOLIC PANEL
ALT: 11 U/L (ref 0–44)
AST: 13 U/L — ABNORMAL LOW (ref 15–41)
Albumin: 3.8 g/dL (ref 3.5–5.0)
Alkaline Phosphatase: 49 U/L (ref 38–126)
Anion gap: 8 (ref 5–15)
BUN: 19 mg/dL (ref 6–20)
CO2: 27 mmol/L (ref 22–32)
Calcium: 9 mg/dL (ref 8.9–10.3)
Chloride: 103 mmol/L (ref 98–111)
Creatinine, Ser: 0.95 mg/dL (ref 0.44–1.00)
GFR, Estimated: >60 mL/min (ref >60)
Glucose, Bld: 93 mg/dL (ref 70–99)
Potassium: 3.8 mmol/L (ref 3.5–5.1)
Sodium: 138 mmol/L (ref 135–145)
Total Bilirubin: 0.5 mg/dL (ref 0.3–1.2)
Total Protein: 7.3 g/dL (ref 6.5–8.1)

## 2022-06-20 LAB — PREGNANCY, URINE: Preg Test, Ur: NEGATIVE

## 2022-06-20 MED ORDER — CYCLOBENZAPRINE HCL 10 MG PO TABS: 10.0000 mg | ORAL_TABLET | Freq: Three times a day (TID) | ORAL | 0 refills | Status: DC | PRN

## 2022-06-20 MED ORDER — IOHEXOL 300 MG/ML  SOLN
100.0000 mL | Freq: Once | INTRAMUSCULAR | Status: AC | PRN
  Administered 2022-06-20: 100 mL via INTRAVENOUS

## 2022-06-20 NOTE — ED Notes (Signed)
Patient given discharge instructions and follow up care. Patient verbalized understanding. IV removed with catheter intact. Patient ambulatory out of ED. 

## 2022-06-24 ENCOUNTER — Telehealth: Payer: Self-pay

## 2022-06-24 NOTE — Telephone Encounter (Signed)
Cb [phone] Pt is returning Brittany's call.

## 2022-06-24 NOTE — Transitions of Care (Post Inpatient/ED Visit) (Unsigned)
   06/24/2022  Name: Jarita Raval MRN: [id number] DOB: [birth year]  Today's TOC FU Call Status: Today's TOC FU Call Status:: Unsuccessul Call (1st Attempt) Unsuccessful Call (1st Attempt) Date: 06/24/22  Attempted to reach the patient regarding the most recent Inpatient/ED visit.  Follow Up Plan: Additional outreach attempts will be made to reach the patient to complete the Transitions of Care (Post Inpatient/ED visit) call.   Signature: Wilhemena Durie, CMA

## 2022-06-27 DIAGNOSIS — N951 Menopausal and female climacteric states: Secondary | ICD-10-CM | POA: Diagnosis not present

## 2022-06-27 DIAGNOSIS — Z13220 Encounter for screening for lipoid disorders: Secondary | ICD-10-CM | POA: Diagnosis not present

## 2022-06-27 DIAGNOSIS — Z6835 Body mass index (BMI) 35.0-35.9, adult: Secondary | ICD-10-CM | POA: Diagnosis not present

## 2022-06-27 DIAGNOSIS — Z131 Encounter for screening for diabetes mellitus: Secondary | ICD-10-CM | POA: Diagnosis not present

## 2022-06-27 DIAGNOSIS — R7303 Prediabetes: Secondary | ICD-10-CM | POA: Diagnosis not present

## 2022-06-27 DIAGNOSIS — R635 Abnormal weight gain: Secondary | ICD-10-CM | POA: Diagnosis not present

## 2022-06-27 DIAGNOSIS — I10 Essential (primary) hypertension: Secondary | ICD-10-CM | POA: Diagnosis not present

## 2022-06-27 NOTE — Addendum Note (Signed)
Addended by: Pablo Ledger on: 06/27/2022 10:19 AM   Modules accepted: Orders

## 2022-06-27 NOTE — Transitions of Care (Post Inpatient/ED Visit) (Signed)
   06/27/2022  Name: Lameisha Schuenemann MRN: [id number] DOB: Nov 10, 1985  Today's TOC FU Call Status: Today's TOC FU Call Status:: Unsuccessful Call (2nd Attempt) Unsuccessful Call (1st Attempt) Date: 06/24/22 Unsuccessful Call (2nd Attempt) Date: 06/27/22  Attempted to reach the patient regarding the most recent Inpatient/ED visit.  Follow Up Plan: Additional outreach attempts will be made to reach the patient to complete the Transitions of Care (Post Inpatient/ED visit) call.   Signature: Wilhemena Durie, CMA

## 2022-06-27 NOTE — Transitions of Care (Post Inpatient/ED Visit) (Signed)
   06/27/2022  Name: Mandy Stewart MRN: [id number] DOB: 07-21-1985  Today's TOC FU Call Status: Today's TOC FU Call Status:: Successful TOC FU Call Competed Unsuccessful Call (1st Attempt) Date: 06/24/22 Unsuccessful Call (2nd Attempt) Date: 06/27/22 [facility] FU Call Complete Date: 06/27/22  Transition Care Management Follow-up Telephone Call Date of Discharge: 06/19/22 Discharge Facility: [facility]) Type of Discharge: Emergency Department Reason for ED Visit: Other: How have you been since you were released from the hospital?: Better Any questions or concerns?: No  Items Reviewed: Did you receive and understand the discharge instructions provided?: Yes Medications obtained and verified?: Yes (Medications Reviewed) Any new allergies since your discharge?: No Dietary orders reviewed?: NA Do you have support at home?: Yes People in Home: sibling(s)  Home Care and Equipment/Supplies: Were Home Health Services Ordered?: NA Any new equipment or medical supplies ordered?: NA  Functional Questionnaire: Do you need assistance with bathing/showering or dressing?: No Do you need assistance with meal preparation?: No Do you need assistance with eating?: No Do you have difficulty maintaining continence: No Do you need assistance with getting out of bed/getting out of a chair/moving?: No Do you have difficulty managing or taking your medications?: No  Follow up appointments reviewed: PCP Follow-up appointment confirmed?: Yes Date of PCP follow-up appointment?: 06/28/22 Follow-up Provider: Larae Grooms, NP-[facility] Follow-up appointment confirmed?: NA Do you need transportation to your follow-up appointment?: No Do you understand care options if your condition(s) worsen?: Yes-patient verbalized understanding    SIGNATURE: Wilhemena Durie, CMA

## 2022-06-28 ENCOUNTER — Telehealth (INDEPENDENT_AMBULATORY_CARE_PROVIDER_SITE_OTHER): Payer: BC Managed Care – PPO | Admitting: Nurse Practitioner

## 2022-06-28 ENCOUNTER — Encounter: Payer: Self-pay | Admitting: Nurse Practitioner

## 2022-06-28 DIAGNOSIS — M545 Low back pain, unspecified: Secondary | ICD-10-CM

## 2022-06-28 MED ORDER — METHYLPREDNISOLONE 4 MG PO TBPK: ORAL_TABLET | ORAL | 0 refills | Status: DC

## 2022-06-28 MED ORDER — NAPROXEN 500 MG PO TABS: 500.0000 mg | ORAL_TABLET | Freq: Two times a day (BID) | ORAL | 0 refills | Status: DC
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
## 2022-06-30 ENCOUNTER — Telehealth: Payer: Self-pay | Admitting: Nurse Practitioner

## 2022-06-30 DIAGNOSIS — M545 Low back pain, unspecified: Secondary | ICD-10-CM

## 2022-06-30 NOTE — Telephone Encounter (Signed)
Orders placed.

## 2022-06-30 NOTE — Telephone Encounter (Signed)
Called and LVM notifying patient that the x-ray order has been placed for her to have completed.

## 2022-06-30 NOTE — Telephone Encounter (Signed)
Equities trader. Notified the receptionist of [name]'s message. She states she is going to contact the doctor and ask which xray he would like ordered. Will await a returned call.   OK for the [facility] to get the information when they call back.

## 2022-06-30 NOTE — Telephone Encounter (Signed)
Copied from CRM [id number]. Topic: General - Other >> Jun 29, 2022  5:10 PM Santiya F wrote: Reason for CRM: Dr. Milagros Evener at [facility] wants to know if [name] would be interested in sending an order for another Xray. [phone]

## 2022-06-30 NOTE — Telephone Encounter (Signed)
That would be fine.  Does he want an xray of the Lumbar?

## 2022-06-30 NOTE — Telephone Encounter (Signed)
"  Needs the lower lumbar spine and the pelvis. And it is okay to give the patient the order and she can go where she would normally be sent, and we will communicate with office regarding results. Chiropractor would like a copy, whether its faxed or hand delivered."  [place]  Fax: [phone]

## 2022-07-01 ENCOUNTER — Ambulatory Visit
Admission: RE | Admit: 2022-07-01 | Discharge: 2022-07-01 | Disposition: A | Discharge status: Home / Self Care | Payer: BC Managed Care – PPO | Source: Ambulatory Visit | Attending: Nurse Practitioner | Admitting: Nurse Practitioner

## 2022-07-01 DIAGNOSIS — R102 Pelvic and perineal pain: Secondary | ICD-10-CM | POA: Diagnosis not present

## 2022-07-01 DIAGNOSIS — M545 Low back pain, unspecified: Secondary | ICD-10-CM | POA: Insufficient documentation

## 2022-07-05 DIAGNOSIS — R7989 Other specified abnormal findings of blood chemistry: Secondary | ICD-10-CM | POA: Diagnosis not present

## 2022-07-05 DIAGNOSIS — Z1331 Encounter for screening for depression: Secondary | ICD-10-CM | POA: Diagnosis not present

## 2022-07-05 DIAGNOSIS — Z6835 Body mass index (BMI) 35.0-35.9, adult: Secondary | ICD-10-CM | POA: Diagnosis not present

## 2022-07-05 DIAGNOSIS — R7303 Prediabetes: Secondary | ICD-10-CM | POA: Diagnosis not present

## 2022-07-05 DIAGNOSIS — I10 Essential (primary) hypertension: Secondary | ICD-10-CM | POA: Diagnosis not present

## 2022-07-07 NOTE — Progress Notes (Signed)
Hi Mandy Stewart.  The xray of your pelvis is normal.

## 2022-07-07 NOTE — Progress Notes (Signed)
HI Erabella. The xray of your lumbar spine is normal.

## 2022-07-18 DIAGNOSIS — I10 Essential (primary) hypertension: Secondary | ICD-10-CM | POA: Diagnosis not present

## 2022-07-18 DIAGNOSIS — Z6834 Body mass index (BMI) 34.0-34.9, adult: Secondary | ICD-10-CM | POA: Diagnosis not present

## 2022-08-02 DIAGNOSIS — R7303 Prediabetes: Secondary | ICD-10-CM | POA: Diagnosis not present

## 2022-08-02 DIAGNOSIS — Z6834 Body mass index (BMI) 34.0-34.9, adult: Secondary | ICD-10-CM | POA: Diagnosis not present

## 2022-08-16 DIAGNOSIS — R7989 Other specified abnormal findings of blood chemistry: Secondary | ICD-10-CM | POA: Diagnosis not present

## 2022-08-16 DIAGNOSIS — Z6832 Body mass index (BMI) 32.0-32.9, adult: Secondary | ICD-10-CM | POA: Diagnosis not present

## 2022-08-30 DIAGNOSIS — Z6833 Body mass index (BMI) 33.0-33.9, adult: Secondary | ICD-10-CM | POA: Diagnosis not present

## 2022-08-30 DIAGNOSIS — I10 Essential (primary) hypertension: Secondary | ICD-10-CM | POA: Diagnosis not present

## 2022-09-28 DIAGNOSIS — E038 Other specified hypothyroidism: Secondary | ICD-10-CM | POA: Diagnosis not present

## 2022-09-28 DIAGNOSIS — Z6833 Body mass index (BMI) 33.0-33.9, adult: Secondary | ICD-10-CM | POA: Diagnosis not present

## 2022-09-28 DIAGNOSIS — I10 Essential (primary) hypertension: Secondary | ICD-10-CM | POA: Diagnosis not present

## 2022-10-12 ENCOUNTER — Other Ambulatory Visit: Payer: Self-pay | Admitting: Nurse Practitioner

## 2022-10-13 MED ORDER — LORAZEPAM 0.5 MG PO TABS: 0.5000 mg | ORAL_TABLET | Freq: Two times a day (BID) | ORAL | 0 refills | Status: DC | PRN

## 2022-10-26 DIAGNOSIS — Z6832 Body mass index (BMI) 32.0-32.9, adult: Secondary | ICD-10-CM | POA: Diagnosis not present

## 2022-10-26 DIAGNOSIS — R7303 Prediabetes: Secondary | ICD-10-CM | POA: Diagnosis not present

## 2022-10-26 DIAGNOSIS — I10 Essential (primary) hypertension: Secondary | ICD-10-CM | POA: Diagnosis not present

## 2022-11-04 ENCOUNTER — Other Ambulatory Visit: Payer: Self-pay | Admitting: Nurse Practitioner

## 2022-11-04 NOTE — Telephone Encounter (Signed)
Requested Prescriptions  Pending Prescriptions Disp Refills   venlafaxine XR (EFFEXOR-XR) 37.5 MG 24 hr capsule [Pharmacy Med Name: VENLAFAXINE HCL ER 37.5 MG CAP] 90 capsule 0    Sig: TAKE 1 CAPSULE BY MOUTH DAILY WITH BREAKFAST.     Psychiatry: Antidepressants - SNRI - desvenlafaxine & venlafaxine Failed - 11/04/2022  9:32 AM      Failed - Last BP in normal range    BP Readings from Last 1 Encounters:  06/20/22 (!) 153/106         Failed - Lipid Panel in normal range within the last 12 months    Cholesterol, Total  Date Value Ref Range Status  01/04/2022 176 100 - 199 mg/dL Final   LDL Chol Calc (NIH)  Date Value Ref Range Status  01/04/2022 86 0 - 99 mg/dL Final   HDL  Date Value Ref Range Status  01/04/2022 50 >39 mg/dL Final   Triglycerides  Date Value Ref Range Status  01/04/2022 239 (H) 0 - 149 mg/dL Final         Passed - Cr in normal range and within 360 days    Creatinine, Ser  Date Value Ref Range Status  06/19/2022 0.95 0.44 - 1.00 mg/dL Final         Passed - Completed PHQ-2 or PHQ-9 in the last 360 days      Passed - Valid encounter within last 6 months    Recent Outpatient Visits           4 months ago Acute bilateral low back pain without sciatica   [facility] Larae Grooms, NP   6 months ago Depression, recurrent [facility])   [facility] Larae Grooms, NP   10 months ago Annual physical exam   [place] [facility] Larae Grooms, NP   1 year ago Anxiety   [place] [facility] Larae Grooms, NP   1 year ago Depression, recurrent [facility])   [place] [facility] Larae Grooms, NP       Future Appointments             In 3 weeks Mecum, Oswaldo Conroy, PA-C [place] [facility]             valsartan (DIOVAN) 40 MG tablet [Pharmacy Med Name: VALSARTAN 40 MG TABLET] 90 tablet 0    Sig: TAKE 1 TABLET BY MOUTH EVERY DAY      Cardiovascular:  Angiotensin Receptor Blockers Failed - 11/04/2022  9:32 AM      Failed - Last BP in normal range    BP Readings from Last 1 Encounters:  06/20/22 (!) 153/106         Passed - Cr in normal range and within 180 days    Creatinine, Ser  Date Value Ref Range Status  06/19/2022 0.95 0.44 - 1.00 mg/dL Final         Passed - K in normal range and within 180 days    Potassium  Date Value Ref Range Status  06/19/2022 3.8 3.5 - 5.1 mmol/L Final         Passed - Patient is not pregnant      Passed - Valid encounter within last 6 months    Recent Outpatient Visits           4 months ago Acute bilateral low back pain without sciatica   [place] [facility] Larae Grooms, NP  6 months ago Depression, recurrent [facility])   [place] [facility] Larae Grooms, NP   10 months ago Annual physical exam   [place] [facility] Larae Grooms, NP   1 year ago Anxiety   [place] [facility] Larae Grooms, NP   1 year ago Depression, recurrent [facility])   [place] [facility] Larae Grooms, NP       Future Appointments             In 3 weeks Mecum, Oswaldo Conroy, PA-C [place] [facility], [facility]

## 2022-11-21 ENCOUNTER — Ambulatory Visit: Payer: BC Managed Care – PPO | Admitting: Nurse Practitioner

## 2022-11-21 ENCOUNTER — Encounter: Payer: Self-pay | Admitting: Nurse Practitioner

## 2022-11-21 ENCOUNTER — Ambulatory Visit: Payer: BC Managed Care – PPO | Admitting: Physician Assistant

## 2022-11-21 VITALS — BP 115/88 | HR 86 | Temp 98.4°F | Wt 173.6 lb

## 2022-11-21 DIAGNOSIS — F419 Anxiety disorder, unspecified: Secondary | ICD-10-CM

## 2022-11-21 DIAGNOSIS — F339 Major depressive disorder, recurrent, unspecified: Secondary | ICD-10-CM | POA: Diagnosis not present

## 2022-11-21 DIAGNOSIS — R052 Subacute cough: Secondary | ICD-10-CM | POA: Insufficient documentation

## 2022-11-21 MED ORDER — PREDNISONE 20 MG PO TABS: 40.0000 mg | ORAL_TABLET | Freq: Every day | ORAL | 0 refills | Status: AC

## 2022-11-21 MED ORDER — VENLAFAXINE HCL ER 75 MG PO CP24: 75.0000 mg | ORAL_CAPSULE | Freq: Every day | ORAL | 2 refills | Status: DC

## 2022-11-21 MED ORDER — AZITHROMYCIN 250 MG PO TABS: ORAL_TABLET | ORAL | 0 refills | Status: AC

## 2022-11-21 NOTE — Assessment & Plan Note (Signed)
Chronic, ongoing.  Denies SI/HI.  Exacerbated by loss of sister.  Will trial increase of XR Effexor to 75 MG daily and reassess in October at physical.  She will look into therapists locally who are covered by [facility].  Recommend therapy to assist with mood.

## 2022-11-21 NOTE — Assessment & Plan Note (Signed)
Ongoing for one week with no improvement.  At this time will start Zpack and Prednisone to assist with symptoms due to length of them.  She tested negative for Covid at home.  Recommend: - Increased rest - Increasing Fluids - Acetaminophen / ibuprofen as needed for fever/pain.  - Salt water gargling, chloraseptic spray and throat lozenges - Mucinex.  - Saline sinus flushes or a neti pot.  - Humidifying the air.  Return if ongoing or worsening.

## 2022-11-21 NOTE — Progress Notes (Signed)
BP 115/88   Pulse 86   Temp 98.4 F (36.9 C) (Oral)   Wt 173 lb 9.6 oz (78.7 kg)   SpO2 98%   BMI 31.75 kg/m    Subjective:    Patient ID: Mandy Stewart, female    DOB: Jan 14, 1986, 37 y.o.   MRN: [id number]  HPI: Mandy Stewart is a 37 y.o. female  Chief Complaint  Patient presents with   URI    Patient states she has been having sinus congestion, sinus pressure, cough with green phlegm, and ear drainage for the last week. States she has tried OTC cold and flu tablets, nyquil, and allergy medication. States she has been very tired as well. Negative at home covid test per patient.    Depression    Patient states she would like to discuss possibly increasing her Venlafaxine   UPPER RESPIRATORY TRACT INFECTION Symptoms present for one week with reported negative home Covid test.  She is coughing up green phlegm.   Fever: no Cough: yes Shortness of breath: yes Wheezing: yes Chest pain: no Chest tightness: no Chest congestion: yes Nasal congestion: yes Runny nose: no Post nasal drip: yes Sneezing: no Sore throat: no Swollen glands: no Sinus pressure: yes Headache: no Face pain: no Toothache: no Ear pain: a little bit right side Ear pressure: a little bit right side Eyes red/itching:no Eye drainage/crusting: no  Vomiting: no Rash: no Fatigue: yes Sick contacts: no Strep contacts: no  Context: stable Recurrent sinusitis: no Relief with OTC cold/cough medications: no  Treatments attempted: over the counter cold/flu, Zyrtec, and nyquil    DEPRESSION Currently taking Effexor XR 37.5 MG daily. Has been on this close to a year.  Started out on Prozac, but this did not work well.  Sister passed recently and mood is exacerbated. Mood status: exacerbated Satisfied with current treatment?: yes Symptom severity: moderate  Duration of current treatment : years Side effects: no Medication compliance: good compliance Psychotherapy/counseling: yes in the past Depressed mood:  yes Anxious mood: yes Anhedonia: sometimes Significant weight loss or gain: no Insomnia: sleeping too much Fatigue: yes Feelings of worthlessness or guilt: sometimes Impaired concentration/indecisiveness: no Suicidal ideations: no Hopelessness: yes Crying spells: yes    11/21/2022    2:50 PM 05/05/2022   11:03 AM 01/04/2022    3:04 PM 05/10/2021    4:06 PM 03/11/2021    3:09 PM  Depression screen PHQ 2/9  Decreased Interest 0 2 1 1 2   Down, Depressed, Hopeless 1 1 1 2 2   PHQ - 2 Score 1 3 2 3 4   Altered sleeping 3 0 1 2 3   Tired, decreased energy 3 0 1 1 2   Change in appetite 1 0 0 0 2  Feeling bad or failure about yourself  1 0 1 1 0  Trouble concentrating 0 0 0 1 1  Moving slowly or fidgety/restless 0 0 0 0 0  Suicidal thoughts 0 0 0 0 0  PHQ-9 Score 9 3 5 8 12   Difficult doing work/chores Somewhat difficult Somewhat difficult Somewhat difficult Somewhat difficult Somewhat difficult       11/21/2022    2:50 PM 05/05/2022   11:04 AM 01/04/2022    3:04 PM 05/10/2021    4:06 PM  GAD 7 : Generalized Anxiety Score  Nervous, Anxious, on Edge 1 2 2 1   Control/stop worrying 2 3 2 2   Worry too much - different things 3 3 3 2   Trouble relaxing 2 1 1  1  Restless 0 1 1 0  Easily annoyed or irritable 2 3 2 1   Afraid - awful might happen 3 3 3 2   Total GAD 7 Score 13 16 14 9   Anxiety Difficulty Somewhat difficult Very difficult Very difficult Somewhat difficult   Relevant past medical, surgical, family and social history reviewed and updated as indicated. Interim medical history since our last visit reviewed. Allergies and medications reviewed and updated.  Review of Systems  Constitutional:  Positive for fatigue and fever. Negative for activity change and appetite change.  HENT:  Positive for congestion, postnasal drip and sinus pressure. Negative for ear discharge, ear pain, facial swelling, rhinorrhea, sinus pain, sneezing, sore throat and voice change.   Eyes:  Negative for pain  and visual disturbance.  Respiratory:  Positive for cough, chest tightness, shortness of breath and wheezing.   Cardiovascular:  Negative for chest pain, palpitations and leg swelling.  Gastrointestinal: Negative.   Endocrine: Negative.   Neurological:  Positive for headaches. Negative for dizziness and numbness.  Psychiatric/Behavioral:  Negative for decreased concentration, self-injury, sleep disturbance and suicidal ideas. The patient is nervous/anxious.     Per HPI unless specifically indicated above     Objective:    BP 115/88   Pulse 86   Temp 98.4 F (36.9 C) (Oral)   Wt 173 lb 9.6 oz (78.7 kg)   SpO2 98%   BMI 31.75 kg/m   Wt Readings from Last 3 Encounters:  11/21/22 173 lb 9.6 oz (78.7 kg)  06/19/22 190 lb (86.2 kg)  06/16/22 190 lb (86.2 kg)    Physical Exam Vitals and nursing note reviewed.  Constitutional:      General: She is awake. She is not in acute distress.    Appearance: She is well-developed and well-groomed. She is obese. She is ill-appearing. She is not toxic-appearing.  HENT:     Head: Normocephalic.     Right Ear: Hearing, ear canal and external ear normal. A middle ear effusion is present. Tympanic membrane is not injected or perforated.     Left Ear: Hearing, ear canal and external ear normal. A middle ear effusion is present. Tympanic membrane is not injected or perforated.     Nose: Rhinorrhea present. Rhinorrhea is clear.     Right Sinus: No maxillary sinus tenderness or frontal sinus tenderness.     Left Sinus: No maxillary sinus tenderness or frontal sinus tenderness.     Mouth/Throat:     Mouth: Mucous membranes are moist.     Pharynx: Posterior oropharyngeal erythema (mild) present. No pharyngeal swelling or oropharyngeal exudate.  Eyes:     General: Lids are normal.        Right eye: No discharge.        Left eye: No discharge.     Conjunctiva/sclera: Conjunctivae normal.     Pupils: Pupils are equal, round, and reactive to light.   Neck:     Vascular: No carotid bruit.  Cardiovascular:     Rate and Rhythm: Normal rate and regular rhythm.     Heart sounds: Normal heart sounds. No murmur heard.    No gallop.  Pulmonary:     Effort: Pulmonary effort is normal. No accessory muscle usage or respiratory distress.     Breath sounds: Normal breath sounds. No decreased breath sounds, wheezing or rhonchi.  Abdominal:     General: Bowel sounds are normal.     Palpations: Abdomen is soft. There is no hepatomegaly or splenomegaly.  Musculoskeletal:  Cervical back: Normal range of motion and neck supple.     Right lower leg: No edema.     Left lower leg: No edema.  Lymphadenopathy:     Head:     Right side of head: No submental, submandibular, tonsillar, preauricular or posterior auricular adenopathy.     Left side of head: No submental, submandibular, tonsillar, preauricular or posterior auricular adenopathy.     Cervical: No cervical adenopathy.  Skin:    General: Skin is warm and dry.  Neurological:     Mental Status: She is alert and oriented to person, place, and time.  Psychiatric:        Attention and Perception: Attention normal.        Mood and Affect: Mood normal.        Speech: Speech normal.        Behavior: Behavior normal. Behavior is cooperative.        Thought Content: Thought content normal.    Results for orders placed or performed during the hospital encounter of 06/19/22  CBC with Differential  Result Value Ref Range   WBC 5.1 4.0 - 10.5 K/uL   RBC 4.30 3.87 - 5.11 MIL/uL   Hemoglobin 13.0 12.0 - 15.0 g/dL   HCT 96.0 45.4 - 09.8 %   MCV 91.9 80.0 - 100.0 fL   MCH 30.2 26.0 - 34.0 pg   MCHC 32.9 30.0 - 36.0 g/dL   RDW 11.9 14.7 - 82.9 %   Platelets 235 150 - 400 K/uL   nRBC 0.0 0.0 - 0.2 %   Neutrophils Relative % 44 %   Neutro Abs 2.3 1.7 - 7.7 K/uL   Lymphocytes Relative 47 %   Lymphs Abs 2.3 0.7 - 4.0 K/uL   Monocytes Relative 8 %   Monocytes Absolute 0.4 0.1 - 1.0 K/uL    Eosinophils Relative 1 %   Eosinophils Absolute 0.1 0.0 - 0.5 K/uL   Basophils Relative 0 %   Basophils Absolute 0.0 0.0 - 0.1 K/uL   Immature Granulocytes 0 %   Abs Immature Granulocytes 0.01 0.00 - 0.07 K/uL  Comprehensive metabolic panel  Result Value Ref Range   Sodium 138 135 - 145 mmol/L   Potassium 3.8 3.5 - 5.1 mmol/L   Chloride 103 98 - 111 mmol/L   CO2 27 22 - 32 mmol/L   Glucose, Bld 93 70 - 99 mg/dL   BUN 19 6 - 20 mg/dL   Creatinine, Ser 5.62 0.44 - 1.00 mg/dL   Calcium 9.0 8.9 - 13.0 mg/dL   Total Protein 7.3 6.5 - 8.1 g/dL   Albumin 3.8 3.5 - 5.0 g/dL   AST 13 (L) 15 - 41 U/L   ALT 11 0 - 44 U/L   Alkaline Phosphatase 49 38 - 126 U/L   Total Bilirubin 0.5 0.3 - 1.2 mg/dL   GFR, Estimated >86 >57 mL/min   Anion gap 8 5 - 15  Urinalysis, Routine w reflex microscopic -Urine, Clean Catch  Result Value Ref Range   Color, Urine YELLOW YELLOW   APPearance CLEAR CLEAR   Specific Gravity, Urine 1.018 1.005 - 1.030   pH 7.0 5.0 - 8.0   Glucose, UA NEGATIVE NEGATIVE mg/dL   Hgb urine dipstick NEGATIVE NEGATIVE   Bilirubin Urine NEGATIVE NEGATIVE   Ketones, ur NEGATIVE NEGATIVE mg/dL   Protein, ur NEGATIVE NEGATIVE mg/dL   Nitrite NEGATIVE NEGATIVE   Leukocytes,Ua TRACE (A) NEGATIVE   RBC / HPF 0-5 0 - 5  RBC/hpf   WBC, UA 0-5 0 - 5 WBC/hpf   Bacteria, UA RARE (A) NONE SEEN   Squamous Epithelial / HPF 0-5 0 - 5 /HPF   Mucus PRESENT   Pregnancy, urine  Result Value Ref Range   Preg Test, Ur NEGATIVE NEGATIVE      Assessment & Plan:   Problem List Items Addressed This Visit       Other   Anxiety    Refer to depression plan of care.      Relevant Medications   venlafaxine XR (EFFEXOR-XR) 75 MG 24 hr capsule   Depression, recurrent (HCC) - Primary    Chronic, ongoing.  Denies SI/HI.  Exacerbated by loss of sister.  Will trial increase of XR Effexor to 75 MG daily and reassess in October at physical.  She will look into therapists locally who are covered by  [facility].  Recommend therapy to assist with mood.      Relevant Medications   venlafaxine XR (EFFEXOR-XR) 75 MG 24 hr capsule   Subacute cough    Ongoing for one week with no improvement.  At this time will start Zpack and Prednisone to assist with symptoms due to length of them.  She tested negative for Covid at home.  Recommend: - Increased rest - Increasing Fluids - Acetaminophen / ibuprofen as needed for fever/pain.  - Salt water gargling, chloraseptic spray and throat lozenges - Mucinex.  - Saline sinus flushes or a neti pot.  - Humidifying the air.  Return if ongoing or worsening.        Follow up plan: Return for Return as schedule October 22nd.

## 2022-11-21 NOTE — Assessment & Plan Note (Signed)
Refer to depression plan of care. 

## 2022-11-21 NOTE — Patient Instructions (Signed)

## 2022-11-29 ENCOUNTER — Encounter: Payer: Commercial Managed Care - HMO | Admitting: Physician Assistant

## 2022-12-12 DIAGNOSIS — E038 Other specified hypothyroidism: Secondary | ICD-10-CM | POA: Diagnosis not present

## 2022-12-12 DIAGNOSIS — R7303 Prediabetes: Secondary | ICD-10-CM | POA: Diagnosis not present

## 2022-12-12 DIAGNOSIS — Z6832 Body mass index (BMI) 32.0-32.9, adult: Secondary | ICD-10-CM | POA: Diagnosis not present

## 2022-12-20 ENCOUNTER — Encounter: Payer: Commercial Managed Care - HMO | Admitting: Nurse Practitioner

## 2022-12-26 DIAGNOSIS — Z6832 Body mass index (BMI) 32.0-32.9, adult: Secondary | ICD-10-CM | POA: Diagnosis not present

## 2022-12-26 DIAGNOSIS — I10 Essential (primary) hypertension: Secondary | ICD-10-CM | POA: Diagnosis not present

## 2023-01-11 ENCOUNTER — Encounter: Payer: Self-pay | Admitting: Nurse Practitioner

## 2023-01-11 ENCOUNTER — Ambulatory Visit (INDEPENDENT_AMBULATORY_CARE_PROVIDER_SITE_OTHER): Payer: BC Managed Care – PPO | Admitting: Nurse Practitioner

## 2023-01-11 VITALS — BP 120/80 | HR 95 | Temp 98.0°F | Ht 61.0 in | Wt 174.2 lb

## 2023-01-11 DIAGNOSIS — F419 Anxiety disorder, unspecified: Secondary | ICD-10-CM | POA: Diagnosis not present

## 2023-01-11 DIAGNOSIS — Z0000 Encounter for general adult medical examination without abnormal findings: Secondary | ICD-10-CM | POA: Diagnosis not present

## 2023-01-11 DIAGNOSIS — Z136 Encounter for screening for cardiovascular disorders: Secondary | ICD-10-CM | POA: Diagnosis not present

## 2023-01-11 DIAGNOSIS — R87610 Atypical squamous cells of undetermined significance on cytologic smear of cervix (ASC-US): Secondary | ICD-10-CM

## 2023-01-11 DIAGNOSIS — F339 Major depressive disorder, recurrent, unspecified: Secondary | ICD-10-CM | POA: Diagnosis not present

## 2023-01-11 DIAGNOSIS — R7303 Prediabetes: Secondary | ICD-10-CM

## 2023-01-11 MED ORDER — BUSPIRONE HCL 10 MG PO TABS: 10.0000 mg | ORAL_TABLET | Freq: Two times a day (BID) | ORAL | 1 refills | Status: DC

## 2023-01-11 MED ORDER — NORETHINDRONE ACET-ETHINYL EST 1.5-30 MG-MCG PO TABS: 1.0000 | ORAL_TABLET | Freq: Every day | ORAL | 3 refills | Status: AC

## 2023-01-11 NOTE — Progress Notes (Unsigned)
BP 120/80 Comment: home blood pressure reading  Pulse 95   Temp 98 F (36.7 C) (Oral)   Ht 5\' 1"  (1.549 m)   Wt 174 lb 3.2 oz (79 kg)   SpO2 99%   PF 99 L/min   BMI 32.91 kg/m    Subjective:    Patient ID: Mandy Stewart, female    DOB: 03-23-1985, 37 y.o.   MRN: [id number]  HPI: Mandy Stewart is a 37 y.o. female presenting on 01/11/2023 for comprehensive medical examination. Current medical complaints include:none  She currently lives with: Menopausal Symptoms: no  Patient states she is checking her blood pressure at home and its running 120/80.  DEPRESSION Currently taking Effexor XR 75mg .  She lost her older sister in August.  Feels like the medication is working okay.  She feels like sometimes she feels like she needs something more for anxiety.  She does take the Ativan PRN. Mood status: exacerbated Satisfied with current treatment?: yes Symptom severity: moderate  Duration of current treatment : years Side effects: no Medication compliance: good compliance Psychotherapy/counseling: yes in the past Depressed mood: yes Anxious mood: yes Anhedonia: sometimes Significant weight loss or gain: no Insomnia: sleeping too much Fatigue: yes Feelings of worthlessness or guilt: sometimes Impaired concentration/indecisiveness: no Suicidal ideations: no Hopelessness: yes Crying spells: yes  Depression Screen done today and results listed below:     01/11/2023    3:59 PM 11/21/2022    2:50 PM 05/05/2022   11:03 AM 01/04/2022    3:04 PM 05/10/2021    4:06 PM  Depression screen PHQ 2/9  Decreased Interest 2 0 2 1 1   Down, Depressed, Hopeless 2 1 1 1 2   PHQ - 2 Score 4 1 3 2 3   Altered sleeping 3 3 0 1 2  Tired, decreased energy 1 3 0 1 1  Change in appetite 1 1 0 0 0  Feeling bad or failure about yourself  0 1 0 1 1  Trouble concentrating 0 0 0 0 1  Moving slowly or fidgety/restless 0 0 0 0 0  Suicidal thoughts 0 0 0 0 0  PHQ-9 Score 9 9 3 5 8   Difficult doing work/chores   Somewhat difficult Somewhat difficult Somewhat difficult Somewhat difficult    The patient does not have a history of falls. I did complete a risk assessment for falls. A plan of care for falls was documented.   Past Medical History:  Past Medical History:  Diagnosis Date   HTN (hypertension)    Medical history non-contributory    Prediabetes     Surgical History:  Past Surgical History:  Procedure Laterality Date   CHOLECYSTECTOMY N/A 12/22/2014   Procedure: LAPAROSCOPIC CHOLECYSTECTOMY;  Surgeon: Franky Macho, MD;  Location: [facility];  Service: General;  Laterality: N/A;    Medications:  Current Outpatient Medications on File Prior to Visit  Medication Sig   LORazepam (ATIVAN) 0.5 MG tablet Take 1 tablet (0.5 mg total) by mouth 2 (two) times daily as needed for anxiety.   metFORMIN (GLUCOPHAGE) 500 MG tablet Take 1 tablet (500 mg total) by mouth 2 (two) times daily with a meal.   naproxen (NAPROSYN) 500 MG tablet Take 1 tablet (500 mg total) by mouth 2 (two) times daily with a meal.   phentermine 30 MG capsule Take 30 mg by mouth daily.   promethazine (PHENERGAN) 25 MG tablet TAKE 1 TABLET BY MOUTH EVERY 6 HOURS AS NEEDED FOR NAUSEA OR VOMITING (MIGRAINES)   valsartan (  DIOVAN) 40 MG tablet TAKE 1 TABLET BY MOUTH EVERY DAY   venlafaxine XR (EFFEXOR-XR) 75 MG 24 hr capsule Take 1 capsule (75 mg total) by mouth daily with breakfast.   [DISCONTINUED] montelukast (SINGULAIR) 10 MG tablet Take 10 mg by mouth at bedtime as needed (for allergy relief).    No current facility-administered medications on file prior to visit.    Allergies:  Allergies  Allergen Reactions   Codeine Hives and Nausea And Vomiting    Social History:  Social History   Socioeconomic History   Marital status: Single    Spouse name: Not on file   Number of children: Not on file   Years of education: Not on file   Highest education level: Not on file  Occupational History   Not on file  Tobacco Use    Smoking status: Never   Smokeless tobacco: Never  Vaping Use   Vaping status: Never Used  Substance and Sexual Activity   Alcohol use: Not Currently    Alcohol/week: 1.0 standard drink of alcohol    Types: 1 Cans of beer per week    Comment: occasionally   Drug use: No   Sexual activity: Yes    Birth control/protection: None, Condom  Other Topics Concern   Not on file  Social History Narrative   Not on file   Social Determinants of Health   Financial Resource Strain: Not on file  Food Insecurity: Not on file  Transportation Needs: Not on file  Physical Activity: Not on file  Stress: Not on file  Social Connections: Not on file  Intimate Partner Violence: Not on file   Social History   Tobacco Use  Smoking Status Never  Smokeless Tobacco Never   Social History   Substance and Sexual Activity  Alcohol Use Not Currently   Alcohol/week: 1.0 standard drink of alcohol   Types: 1 Cans of beer per week   Comment: occasionally    Family History:  Family History  Problem Relation Age of Onset   Asthma Mother    Depression Mother    Anxiety disorder Mother    Heart disease Mother    COPD Mother    Bipolar disorder Mother    Hyperlipidemia Father    Diabetes Father    Diabetes Sister     Past medical history, surgical history, medications, allergies, family history and social history reviewed with patient today and changes made to appropriate areas of the chart.   Review of Systems  Psychiatric/Behavioral:  Positive for depression and hallucinations. Negative for suicidal ideas.    All other ROS negative except what is listed above and in the HPI.      Objective:    BP 120/80 Comment: home blood pressure reading  Pulse 95   Temp 98 F (36.7 C) (Oral)   Ht 5\' 1"  (1.549 m)   Wt 174 lb 3.2 oz (79 kg)   SpO2 99%   PF 99 L/min   BMI 32.91 kg/m   Wt Readings from Last 3 Encounters:  01/11/23 174 lb 3.2 oz (79 kg)  11/21/22 173 lb 9.6 oz (78.7 kg)   06/19/22 190 lb (86.2 kg)    Physical Exam Vitals and nursing note reviewed.  Constitutional:      General: She is awake. She is not in acute distress.    Appearance: Normal appearance. She is well-developed. She is not ill-appearing.  HENT:     Head: Normocephalic and atraumatic.     Right Ear:  Hearing, tympanic membrane, ear canal and external ear normal. No drainage.     Left Ear: Hearing, tympanic membrane, ear canal and external ear normal. No drainage.     Nose: Nose normal.     Right Sinus: No maxillary sinus tenderness or frontal sinus tenderness.     Left Sinus: No maxillary sinus tenderness or frontal sinus tenderness.     Mouth/Throat:     Mouth: Mucous membranes are moist.     Pharynx: Oropharynx is clear. Uvula midline. No pharyngeal swelling, oropharyngeal exudate or posterior oropharyngeal erythema.  Eyes:     General: Lids are normal.        Right eye: No discharge.        Left eye: No discharge.     Extraocular Movements: Extraocular movements intact.     Conjunctiva/sclera: Conjunctivae normal.     Pupils: Pupils are equal, round, and reactive to light.     Visual Fields: Right eye visual fields normal and left eye visual fields normal.  Neck:     Thyroid: No thyromegaly.     Vascular: No carotid bruit.     Trachea: Trachea normal.  Cardiovascular:     Rate and Rhythm: Normal rate and regular rhythm.     Heart sounds: Normal heart sounds. No murmur heard.    No gallop.  Pulmonary:     Effort: Pulmonary effort is normal. No accessory muscle usage or respiratory distress.     Breath sounds: Normal breath sounds.  Chest:  Breasts:    Right: Normal.     Left: Normal.  Abdominal:     General: Bowel sounds are normal.     Palpations: Abdomen is soft. There is no hepatomegaly or splenomegaly.     Tenderness: There is no abdominal tenderness.  Musculoskeletal:        General: Normal range of motion.     Cervical back: Normal range of motion and neck supple.      Right lower leg: No edema.     Left lower leg: No edema.  Lymphadenopathy:     Head:     Right side of head: No submental, submandibular, tonsillar, preauricular or posterior auricular adenopathy.     Left side of head: No submental, submandibular, tonsillar, preauricular or posterior auricular adenopathy.     Cervical: No cervical adenopathy.     Upper Body:     Right upper body: No supraclavicular, axillary or pectoral adenopathy.     Left upper body: No supraclavicular, axillary or pectoral adenopathy.  Skin:    General: Skin is warm and dry.     Capillary Refill: Capillary refill takes less than 2 seconds.     Findings: No rash.  Neurological:     Mental Status: She is alert and oriented to person, place, and time.     Gait: Gait is intact.  Psychiatric:        Attention and Perception: Attention normal.        Mood and Affect: Mood normal.        Speech: Speech normal.        Behavior: Behavior normal. Behavior is cooperative.        Thought Content: Thought content normal.        Judgment: Judgment normal.     Results for orders placed or performed in visit on 01/11/23  CBC with Differential/Platelet  Result Value Ref Range   WBC 6.0 3.4 - 10.8 x10E3/uL   RBC 4.70 3.77 - 5.28 x10E6/uL  Hemoglobin 13.7 11.1 - 15.9 g/dL   Hematocrit 34.7 42.5 - 46.6 %   MCV 89 79 - 97 fL   MCH 29.1 26.6 - 33.0 pg   MCHC 32.7 31.5 - 35.7 g/dL   RDW 95.6 38.7 - 56.4 %   Platelets 226 150 - 450 x10E3/uL   Neutrophils 53 Not Estab. %   Lymphs 39 Not Estab. %   Monocytes 7 Not Estab. %   Eos 1 Not Estab. %   Basos 0 Not Estab. %   Neutrophils Absolute 3.1 1.4 - 7.0 x10E3/uL   Lymphocytes Absolute 2.3 0.7 - 3.1 x10E3/uL   Monocytes Absolute 0.4 0.1 - 0.9 x10E3/uL   EOS (ABSOLUTE) 0.1 0.0 - 0.4 x10E3/uL   Basophils Absolute 0.0 0.0 - 0.2 x10E3/uL   Immature Granulocytes 0 Not Estab. %   Immature Grans (Abs) 0.0 0.0 - 0.1 x10E3/uL  Comprehensive metabolic panel  Result Value Ref  Range   Glucose 91 70 - 99 mg/dL   BUN 14 6 - 20 mg/dL   Creatinine, Ser 3.32 0.57 - 1.00 mg/dL   eGFR 951 >88 CZ/YSA/6.30   BUN/Creatinine Ratio 18 9 - 23   Sodium 141 134 - 144 mmol/L   Potassium 3.9 3.5 - 5.2 mmol/L   Chloride 102 96 - 106 mmol/L   CO2 24 20 - 29 mmol/L   Calcium 9.8 8.7 - 10.2 mg/dL   Total Protein 7.7 6.0 - 8.5 g/dL   Albumin 4.7 3.9 - 4.9 g/dL   Globulin, Total 3.0 1.5 - 4.5 g/dL   Bilirubin Total 0.3 0.0 - 1.2 mg/dL   Alkaline Phosphatase 64 44 - 121 IU/L   AST 14 0 - 40 IU/L   ALT 11 0 - 32 IU/L  Lipid panel  Result Value Ref Range   Cholesterol, Total 216 (H) 100 - 199 mg/dL   Triglycerides 160 0 - 149 mg/dL   HDL 68 >10 mg/dL   VLDL Cholesterol Cal 23 5 - 40 mg/dL   LDL Chol Calc (NIH) 932 (H) 0 - 99 mg/dL   Chol/HDL Ratio 3.2 0.0 - 4.4 ratio  TSH  Result Value Ref Range   TSH 1.400 0.450 - 4.500 uIU/mL  Urinalysis, Routine w reflex microscopic  Result Value Ref Range   Specific Gravity, UA >1.030 (H) 1.005 - 1.030   pH, UA 5.5 5.0 - 7.5   Color, UA Yellow Yellow   Appearance Ur Cloudy (A) Clear   Leukocytes,UA Negative Negative   Protein,UA Negative Negative/Trace   Glucose, UA Negative Negative   Ketones, UA Negative Negative   RBC, UA Negative Negative   Bilirubin, UA Negative Negative   Urobilinogen, Ur 0.2 0.2 - 1.0 mg/dL   Nitrite, UA Negative Negative   Microscopic Examination Comment   HgB A1c  Result Value Ref Range   Hgb A1c MFr Bld 6.1 (H) 4.8 - 5.6 %   Est. average glucose Bld gHb Est-mCnc 128 mg/dL      Assessment & Plan:   Problem List Items Addressed This Visit       Other   Anxiety    Chronic.  Not well controlled.  Patient is taking Effexor 75mg  daily.  Would like to hold off on increasing the dose.  Feels like anxiety is worse than depression.  Agrees to add Buspar 10mg  BID.  Side effects and benefits discussed during visit today.  Follow up in 1 month for reevaluation.  Call sooner if concerns arise.  Relevant Medications   busPIRone (BUSPAR) 10 MG tablet   Other Relevant Orders   Ambulatory referral to Psychiatry   Depression, recurrent (HCC)    Chronic.  Not well controlled.  Patient is taking Effexor 75mg  daily.  Would like to hold off on increasing the dose.  Feels like anxiety is worse than depression.  Agrees to add Buspar 10mg  BID.  Side effects and benefits discussed during visit today.  Follow up in 1 month for reevaluation.  Call sooner if concerns arise.       Relevant Medications   busPIRone (BUSPAR) 10 MG tablet   Prediabetes    Recommend a low fat diet.  Last A1c was controlled at 6.2%.  Labs ordered today. Will make recommendations based on results.       Relevant Orders   HgB A1c (Completed)   Other Visit Diagnoses     Annual physical exam    -  Primary   Health maintenance reviewed during visit today.  Labs ordered.  Vaccines reviewed.  PAP screening up to date.   Relevant Orders   CBC with Differential/Platelet (Completed)   Comprehensive metabolic panel (Completed)   Lipid panel (Completed)   TSH (Completed)   Urinalysis, Routine w reflex microscopic (Completed)   Atypical squamous cells of undetermined significance on cytologic smear of cervix (ASC-US)       Reviewed Results with patient during visit.  Discussed the importance of seeing GYN.  Patient hasn't seen GYN yet.   Screening for ischemic heart disease       Relevant Orders   Lipid panel (Completed)        Follow up plan: Return in about 1 month (around 02/10/2023) for Depression/Anxiety FU (virtual).   LABORATORY TESTING:  - Pap smear: up to date  IMMUNIZATIONS:   - Tdap: Tetanus vaccination status reviewed: last tetanus booster within 10 years. - Influenza: Refused - Pneumovax: Not applicable - Prevnar: Not applicable - COVID: Not applicable - HPV: Not applicable - Shingrix vaccine: Not applicable  SCREENING: -Mammogram: Not applicable  - Colonoscopy: Not applicable  - Bone  Density: Not applicable  -Hearing Test: Not applicable  -Spirometry: Not applicable   PATIENT COUNSELING:   Advised to take 1 mg of folate supplement per day if capable of pregnancy.   Sexuality: Discussed sexually transmitted diseases, partner selection, use of condoms, avoidance of unintended pregnancy  and contraceptive alternatives.   Advised to avoid cigarette smoking.  I discussed with the patient that most people either abstain from alcohol or drink within safe limits (<=14/week and <=4 drinks/occasion for males, <=7/weeks and <= 3 drinks/occasion for females) and that the risk for alcohol disorders and other health effects rises proportionally with the number of drinks per week and how often a drinker exceeds daily limits.  Discussed cessation/primary prevention of drug use and availability of treatment for abuse.   Diet: Encouraged to adjust caloric intake to maintain  or achieve ideal body weight, to reduce intake of dietary saturated fat and total fat, to limit sodium intake by avoiding high sodium foods and not adding table salt, and to maintain adequate dietary potassium and calcium preferably from fresh fruits, vegetables, and low-fat dairy products.    stressed the importance of regular exercise  Injury prevention: Discussed safety belts, safety helmets, smoke detector, smoking near bedding or upholstery.   Dental health: Discussed importance of regular tooth brushing, flossing, and dental visits.    NEXT PREVENTATIVE PHYSICAL DUE IN 1 YEAR. Return in about 1  month (around 02/10/2023) for Depression/Anxiety FU (virtual).

## 2023-01-12 ENCOUNTER — Other Ambulatory Visit: Payer: Self-pay | Admitting: Nurse Practitioner

## 2023-01-12 LAB — CBC WITH DIFFERENTIAL/PLATELET
Basophils Absolute: 0 10*3/uL (ref 0.0–0.2)
Basos: 0 %
EOS (ABSOLUTE): 0.1 10*3/uL (ref 0.0–0.4)
Eos: 1 %
Hematocrit: 41.9 % (ref 34.0–46.6)
Hemoglobin: 13.7 g/dL (ref 11.1–15.9)
Immature Grans (Abs): 0 10*3/uL (ref 0.0–0.1)
Immature Granulocytes: 0 %
Lymphocytes Absolute: 2.3 10*3/uL (ref 0.7–3.1)
Lymphs: 39 %
MCH: 29.1 pg (ref 26.6–33.0)
MCHC: 32.7 g/dL (ref 31.5–35.7)
MCV: 89 fL (ref 79–97)
Monocytes Absolute: 0.4 10*3/uL (ref 0.1–0.9)
Monocytes: 7 %
Neutrophils Absolute: 3.1 10*3/uL (ref 1.4–7.0)
Neutrophils: 53 %
Platelets: 226 10*3/uL (ref 150–450)
RBC: 4.7 x10E6/uL (ref 3.77–5.28)
RDW: 13 % (ref 11.7–15.4)
WBC: 6 10*3/uL (ref 3.4–10.8)

## 2023-01-12 LAB — COMPREHENSIVE METABOLIC PANEL
ALT: 11 [IU]/L (ref 0–32)
AST: 14 [IU]/L (ref 0–40)
Albumin: 4.7 g/dL (ref 3.9–4.9)
Alkaline Phosphatase: 64 [IU]/L (ref 44–121)
BUN/Creatinine Ratio: 18 (ref 9–23)
BUN: 14 mg/dL (ref 6–20)
Bilirubin Total: 0.3 mg/dL (ref 0.0–1.2)
CO2: 24 mmol/L (ref 20–29)
Calcium: 9.8 mg/dL (ref 8.7–10.2)
Chloride: 102 mmol/L (ref 96–106)
Creatinine, Ser: 0.76 mg/dL (ref 0.57–1.00)
Globulin, Total: 3 g/dL (ref 1.5–4.5)
Glucose: 91 mg/dL (ref 70–99)
Potassium: 3.9 mmol/L (ref 3.5–5.2)
Sodium: 141 mmol/L (ref 134–144)
Total Protein: 7.7 g/dL (ref 6.0–8.5)
eGFR: 104 mL/min/{1.73_m2} (ref >59)

## 2023-01-12 LAB — URINALYSIS, ROUTINE W REFLEX MICROSCOPIC
Bilirubin, UA: NEGATIVE
Glucose, UA: NEGATIVE
Ketones, UA: NEGATIVE
Leukocytes,UA: NEGATIVE
Nitrite, UA: NEGATIVE
Protein,UA: NEGATIVE
RBC, UA: NEGATIVE
Specific Gravity, UA: >1.03 — ABNORMAL HIGH (ref 1.005–1.030)
Urobilinogen, Ur: 0.2 mg/dL (ref 0.2–1.0)
pH, UA: 5.5 (ref 5.0–7.5)

## 2023-01-12 LAB — HEMOGLOBIN A1C
Est. average glucose Bld gHb Est-mCnc: 128 mg/dL
Hgb A1c MFr Bld: 6.1 % — ABNORMAL HIGH (ref 4.8–5.6)

## 2023-01-12 LAB — TSH: TSH: 1.4 u[IU]/mL (ref 0.450–4.500)

## 2023-01-12 LAB — LIPID PANEL
Chol/HDL Ratio: 3.2 ratio (ref 0.0–4.4)
Cholesterol, Total: 216 mg/dL — ABNORMAL HIGH (ref 100–199)
HDL: 68 mg/dL (ref >39)
LDL Chol Calc (NIH): 125 mg/dL — ABNORMAL HIGH (ref 0–99)
Triglycerides: 134 mg/dL (ref 0–149)
VLDL Cholesterol Cal: 23 mg/dL (ref 5–40)

## 2023-01-12 NOTE — Assessment & Plan Note (Signed)
Recommend a low fat diet.  Last A1c was controlled at 6.2%.  Labs ordered today. Will make recommendations based on results.

## 2023-01-12 NOTE — Assessment & Plan Note (Addendum)
Chronic.  Not well controlled.  Patient is taking Effexor 75mg  daily.  Would like to hold off on increasing the dose.  Feels like anxiety is worse than depression.  Agrees to add Buspar 10mg  BID.  Side effects and benefits discussed during visit today.  Follow up in 1 month for reevaluation.  Call sooner if concerns arise.

## 2023-01-12 NOTE — Assessment & Plan Note (Signed)
Chronic.  Not well controlled.  Patient is taking Effexor 75mg  daily.  Would like to hold off on increasing the dose.  Feels like anxiety is worse than depression.  Agrees to add Buspar 10mg  BID.  Side effects and benefits discussed during visit today.  Follow up in 1 month for reevaluation.  Call sooner if concerns arise.

## 2023-01-13 NOTE — Telephone Encounter (Signed)
Requested Prescriptions  Refused Prescriptions Disp Refills   venlafaxine XR (EFFEXOR-XR) 75 MG 24 hr capsule [Pharmacy Med Name: VENLAFAXINE HCL ER 75 MG CAP] 90 capsule 2    Sig: TAKE 1 CAPSULE BY MOUTH DAILY WITH BREAKFAST.     Psychiatry: Antidepressants - SNRI - desvenlafaxine & venlafaxine Failed - 01/12/2023  2:30 PM      Failed - Lipid Panel in normal range within the last 12 months    Cholesterol, Total  Date Value Ref Range Status  01/11/2023 216 (H) 100 - 199 mg/dL Final   LDL Chol Calc (NIH)  Date Value Ref Range Status  01/11/2023 125 (H) 0 - 99 mg/dL Final   HDL  Date Value Ref Range Status  01/11/2023 68 >39 mg/dL Final   Triglycerides  Date Value Ref Range Status  01/11/2023 134 0 - 149 mg/dL Final         Passed - Cr in normal range and within 360 days    Creatinine, Ser  Date Value Ref Range Status  01/11/2023 0.76 0.57 - 1.00 mg/dL Final         Passed - Completed PHQ-2 or PHQ-9 in the last 360 days      Passed - Last BP in normal range    BP Readings from Last 1 Encounters:  01/11/23 120/80         Passed - Valid encounter within last 6 months    Recent Outpatient Visits           2 days ago Annual physical exam   [facility] Larae Grooms, NP   1 month ago Depression, recurrent [facility])   [facility] [place], [place] T, NP   6 months ago Acute bilateral low back pain without sciatica   [facility] Larae Grooms, NP   8 months ago Depression, recurrent [facility])   [facility] Larae Grooms, NP   1 year ago Annual physical exam   [facility] Larae Grooms, NP       Future Appointments             In 1 month Larae Grooms, NP [place] [facility]

## 2023-01-17 DIAGNOSIS — I10 Essential (primary) hypertension: Secondary | ICD-10-CM | POA: Diagnosis not present

## 2023-01-17 DIAGNOSIS — Z6831 Body mass index (BMI) 31.0-31.9, adult: Secondary | ICD-10-CM | POA: Diagnosis not present

## 2023-01-24 ENCOUNTER — Ambulatory Visit: Payer: Self-pay | Admitting: *Deleted

## 2023-01-24 NOTE — Telephone Encounter (Signed)
Please see if patient can do a virtual appt today.

## 2023-01-24 NOTE — Telephone Encounter (Signed)
  Chief Complaint: requesting medication change or dose change for effexor 75 mg , causing significant side effects Symptoms: thoughts of "being here but not here, can look at things and things are there but look back again and not there". Thinking "the worst" of things. Like something happening to niece , etc. Has had recent death in family in 07-Oct-2022. Feels like "depersonalization". Denies thoughts of harming self or others. Frequency: last 4 days  Pertinent Negatives: Patient denies chest pain no difficulty breathing no physical sx. Denies hurting self or others but does have thoughts of someone getting hurt Disposition: [] ED /[] Urgent Care (no appt availability in office) / [] Appointment(In office/virtual)/ []  [facility]/ [] Home Care/ [] Refused Recommended Disposition /[] [facility]/ [x]  Follow-up with PCP Additional Notes:   Recommended if thoughts worsen call back or go / call Urgent crisis center and gave # text "[phone]" if thoughts arise of hurting self. Recommended calling 911 or go to ED if sx / thoughts worsen. Please advise if OV or My Chart VV should be scheduled. None available with PCP until tomorrow. Please advise.  Medication effexor was changed from 37.5mg  to 75 mg approx 3 weeks ago . Patient reports nothing has changed in life to cause thoughts but medication change.         Reason for Disposition  [1] Caller has URGENT medicine question about med that PCP or specialist prescribed AND [2] triager unable to answer question  Answer Assessment - Initial Assessment Questions 1. NAME of MEDICINE: "What medicine(s) are you calling about?"     Effexor 75 mg  2. QUESTION: "What is your question?" (e.g., double dose of medicine, side effect)     Can dose be adjusted or medication changed? 3. PRESCRIBER: "Who prescribed the medicine?" Reason: if prescribed by specialist, call should be referred to that group.     Larae Grooms, NP 4. SYMPTOMS: "Do you have  any symptoms?" If Yes, ask: "What symptoms are you having?"  "How bad are the symptoms (e.g., mild, moderate, severe)     Yes , thoughts of "depersonalization" since taking 75 mg for 3 weeks. 5. PREGNANCY:  "Is there any chance that you are pregnant?" "When was your last menstrual period?"     na  Protocols used: Medication Question Call-A-AH

## 2023-01-24 NOTE — Telephone Encounter (Signed)
Called and LVM asking for patient to please return my call to schedule virtual appointment.

## 2023-01-25 ENCOUNTER — Encounter: Payer: Self-pay | Admitting: Nurse Practitioner

## 2023-01-25 ENCOUNTER — Telehealth (INDEPENDENT_AMBULATORY_CARE_PROVIDER_SITE_OTHER): Payer: BC Managed Care – PPO | Admitting: Nurse Practitioner

## 2023-01-25 VITALS — Ht 61.0 in | Wt 172.0 lb

## 2023-01-25 DIAGNOSIS — F419 Anxiety disorder, unspecified: Secondary | ICD-10-CM

## 2023-01-25 MED ORDER — LORAZEPAM 0.5 MG PO TABS: 0.5000 mg | ORAL_TABLET | Freq: Two times a day (BID) | ORAL | 0 refills | Status: AC | PRN

## 2023-01-25 NOTE — Assessment & Plan Note (Addendum)
Patient having hallucinations from Buspar.  Stop medication.  Continue with Effexor.  Will send ativan for PRN anxiety use.  Follow up in 5 days.  Call sooner if concerns arise.

## 2023-01-25 NOTE — Progress Notes (Signed)
Ht 5\' 1"  (1.549 m)   Wt 172 lb (78 kg)   BMI 32.50 kg/m    Subjective:    Patient ID: Mandy Stewart, female    DOB: Jul 11, 1985, 37 y.o.   MRN: [id number]  HPI: Mandy Stewart is a 37 y.o. female  Chief Complaint  Patient presents with   mental health medication   DEPRESSION Patient states she is having a hard time recalling events from Saturday and "Sunday.  Feels like she is almost outside of her body.  She is having a hard time focusing on things. Felt like she looked at her bedroom and her room was on fire. Then looked back and it and it wasn't on fire.  She is not having thoughts of harming herself but she did have thoughts she could harm herself if she wanted to.  Has never had these thoughts.  She has never felt this negative before.  Mood status: exacerbated Satisfied with current treatment?: yes Symptom severity: moderate  Duration of current treatment : years Side effects: no Medication compliance: good compliance Psychotherapy/counseling: yes in the past Depressed mood: yes Anxious mood: yes Anhedonia: sometimes Significant weight loss or gain: no Insomnia: sleeping too much Fatigue: yes Feelings of worthlessness or guilt: sometimes Impaired concentration/indecisiveness: no Suicidal ideations: no Hopelessness: yes Crying spells: yes  Flowsheet Row Video Visit from 01/25/2023 in [facility]  PHQ-9 Total Score 8         11" /27/2024    9:04 AM 01/11/2023    3:59 PM 11/21/2022    2:50 PM 05/05/2022   11:04 AM  GAD 7 : Generalized Anxiety Score  Nervous, Anxious, on Edge 2 2 1 2   Control/stop worrying 2 3 2 3   Worry too much - different things 2 3 3 3   Trouble relaxing 0 1 2 1   Restless 0 1 0 1  Easily annoyed or irritable 0 1 2 3   Afraid - awful might happen 2 3 3 3   Total GAD 7 Score 8 14 13 16   Anxiety Difficulty   Somewhat difficult Very difficult     Relevant past medical, surgical, family and social history reviewed and updated  as indicated. Interim medical history since our last visit reviewed. Allergies and medications reviewed and updated.  Review of Systems  Psychiatric/Behavioral:  Positive for dysphoric mood and hallucinations. Negative for suicidal ideas. The patient is nervous/anxious.     Per HPI unless specifically indicated above     Objective:    Ht 5\' 1"  (1.549 m)   Wt 172 lb (78 kg)   BMI 32.50 kg/m   Wt Readings from Last 3 Encounters:  01/25/23 172 lb (78 kg)  01/11/23 174 lb 3.2 oz (79 kg)  11/21/22 173 lb 9.6 oz (78.7 kg)    Physical Exam Vitals and nursing note reviewed.  HENT:     Head: Normocephalic.     Right Ear: Hearing normal.     Left Ear: Hearing normal.     Nose: Nose normal.  Eyes:     Pupils: Pupils are equal, round, and reactive to light.  Pulmonary:     Effort: Pulmonary effort is normal. No respiratory distress.  Neurological:     Mental Status: She is alert.  Psychiatric:        Mood and Affect: Mood normal.        Behavior: Behavior normal.        Thought Content: Thought content normal.  Judgment: Judgment normal.     Results for orders placed or performed in visit on 01/11/23  CBC with Differential/Platelet  Result Value Ref Range   WBC 6.0 3.4 - 10.8 x10E3/uL   RBC 4.70 3.77 - 5.28 x10E6/uL   Hemoglobin 13.7 11.1 - 15.9 g/dL   Hematocrit 60.4 54.0 - 46.6 %   MCV 89 79 - 97 fL   MCH 29.1 26.6 - 33.0 pg   MCHC 32.7 31.5 - 35.7 g/dL   RDW 98.1 19.1 - 47.8 %   Platelets 226 150 - 450 x10E3/uL   Neutrophils 53 Not Estab. %   Lymphs 39 Not Estab. %   Monocytes 7 Not Estab. %   Eos 1 Not Estab. %   Basos 0 Not Estab. %   Neutrophils Absolute 3.1 1.4 - 7.0 x10E3/uL   Lymphocytes Absolute 2.3 0.7 - 3.1 x10E3/uL   Monocytes Absolute 0.4 0.1 - 0.9 x10E3/uL   EOS (ABSOLUTE) 0.1 0.0 - 0.4 x10E3/uL   Basophils Absolute 0.0 0.0 - 0.2 x10E3/uL   Immature Granulocytes 0 Not Estab. %   Immature Grans (Abs) 0.0 0.0 - 0.1 x10E3/uL  Comprehensive  metabolic panel  Result Value Ref Range   Glucose 91 70 - 99 mg/dL   BUN 14 6 - 20 mg/dL   Creatinine, Ser 2.95 0.57 - 1.00 mg/dL   eGFR 621 >30 QM/VHQ/4.69   BUN/Creatinine Ratio 18 9 - 23   Sodium 141 134 - 144 mmol/L   Potassium 3.9 3.5 - 5.2 mmol/L   Chloride 102 96 - 106 mmol/L   CO2 24 20 - 29 mmol/L   Calcium 9.8 8.7 - 10.2 mg/dL   Total Protein 7.7 6.0 - 8.5 g/dL   Albumin 4.7 3.9 - 4.9 g/dL   Globulin, Total 3.0 1.5 - 4.5 g/dL   Bilirubin Total 0.3 0.0 - 1.2 mg/dL   Alkaline Phosphatase 64 44 - 121 IU/L   AST 14 0 - 40 IU/L   ALT 11 0 - 32 IU/L  Lipid panel  Result Value Ref Range   Cholesterol, Total 216 (H) 100 - 199 mg/dL   Triglycerides 629 0 - 149 mg/dL   HDL 68 >52 mg/dL   VLDL Cholesterol Cal 23 5 - 40 mg/dL   LDL Chol Calc (NIH) 841 (H) 0 - 99 mg/dL   Chol/HDL Ratio 3.2 0.0 - 4.4 ratio  TSH  Result Value Ref Range   TSH 1.400 0.450 - 4.500 uIU/mL  Urinalysis, Routine w reflex microscopic  Result Value Ref Range   Specific Gravity, UA >1.030 (H) 1.005 - 1.030   pH, UA 5.5 5.0 - 7.5   Color, UA Yellow Yellow   Appearance Ur Cloudy (A) Clear   Leukocytes,UA Negative Negative   Protein,UA Negative Negative/Trace   Glucose, UA Negative Negative   Ketones, UA Negative Negative   RBC, UA Negative Negative   Bilirubin, UA Negative Negative   Urobilinogen, Ur 0.2 0.2 - 1.0 mg/dL   Nitrite, UA Negative Negative   Microscopic Examination Comment   HgB A1c  Result Value Ref Range   Hgb A1c MFr Bld 6.1 (H) 4.8 - 5.6 %   Est. average glucose Bld gHb Est-mCnc 128 mg/dL      Assessment & Plan:   Problem List Items Addressed This Visit       Other   Anxiety - Primary    Patient having hallucinations from Buspar.  Stop medication.  Continue with Effexor.  Will send ativan for PRN anxiety  use.  Follow up in 5 days.  Call sooner if concerns arise.       Relevant Medications   LORazepam (ATIVAN) 0.5 MG tablet     Follow up plan: Return in about 5 days  (around 01/30/2023) for Depression/Anxiety FU.   This visit was completed via MyChart due to the restrictions of the COVID-19 pandemic. All issues as above were discussed and addressed. Physical exam was done as above through visual confirmation on MyChart. If it was felt that the patient should be evaluated in the office, they were directed there. The patient verbally consented to this visit. Location of the patient: Home Location of the provider: Office Those involved with this call:  Provider: Larae Grooms, NP CMA: Oswaldo Conroy, CMA Front Desk/Registration: Servando Snare This encounter was conducted via video.  I spent 20 dedicated to the care of this patient on the date of this encounter to include previsit review of symptoms, plan of care and follow up, face to face time with the patient, and post visit ordering of testing.

## 2023-01-25 NOTE — Progress Notes (Signed)
Mychart video appt scheduled.

## 2023-02-02 ENCOUNTER — Encounter: Payer: Self-pay | Admitting: Nurse Practitioner

## 2023-02-02 MED ORDER — IBUPROFEN 800 MG PO TABS: 800.0000 mg | ORAL_TABLET | Freq: Three times a day (TID) | ORAL | 0 refills | Status: DC | PRN

## 2023-02-04 ENCOUNTER — Other Ambulatory Visit: Payer: Self-pay | Admitting: Nurse Practitioner

## 2023-02-06 NOTE — Telephone Encounter (Signed)
discontinued on 11/21/2022 by Aura Dials T, Dose change  Requested Prescriptions  Refused Prescriptions Disp Refills   venlafaxine XR (EFFEXOR-XR) 37.5 MG 24 hr capsule [Pharmacy Med Name: VENLAFAXINE HCL ER 37.5 MG CAP] 90 capsule 0    Sig: TAKE 1 CAPSULE BY MOUTH DAILY WITH BREAKFAST.     Psychiatry: Antidepressants - SNRI - desvenlafaxine & venlafaxine Failed - 02/04/2023  9:30 AM      Failed - Lipid Panel in normal range within the last 12 months    Cholesterol, Total  Date Value Ref Range Status  01/11/2023 216 (H) 100 - 199 mg/dL Final   LDL Chol Calc (NIH)  Date Value Ref Range Status  01/11/2023 125 (H) 0 - 99 mg/dL Final   HDL  Date Value Ref Range Status  01/11/2023 68 >39 mg/dL Final   Triglycerides  Date Value Ref Range Status  01/11/2023 134 0 - 149 mg/dL Final         Passed - Cr in normal range and within 360 days    Creatinine, Ser  Date Value Ref Range Status  01/11/2023 0.76 0.57 - 1.00 mg/dL Final         Passed - Completed PHQ-2 or PHQ-9 in the last 360 days      Passed - Last BP in normal range    BP Readings from Last 1 Encounters:  01/11/23 120/80         Passed - Valid encounter within last 6 months    Recent Outpatient Visits           1 week ago Anxiety   [facility] Larae Grooms, NP   3 weeks ago Annual physical exam   [place] Larae Grooms, NP   2 months ago Depression, recurrent [facility])   [place] [facility] Leland, [place] T, NP   7 months ago Acute bilateral low back pain without sciatica   [place] [facility] Larae Grooms, NP   9 months ago Depression, recurrent [facility])   [place] [facility] Larae Grooms, NP

## 2023-02-13 ENCOUNTER — Telehealth: Payer: BC Managed Care – PPO | Admitting: Nurse Practitioner

## 2023-02-20 DIAGNOSIS — F4312 Post-traumatic stress disorder, chronic: Secondary | ICD-10-CM | POA: Diagnosis not present

## 2023-02-20 DIAGNOSIS — F411 Generalized anxiety disorder: Secondary | ICD-10-CM | POA: Diagnosis not present

## 2023-03-07 ENCOUNTER — Ambulatory Visit: Payer: BC Managed Care – PPO | Admitting: Nurse Practitioner

## 2023-03-07 ENCOUNTER — Encounter: Payer: Self-pay | Admitting: Nurse Practitioner

## 2023-03-07 VITALS — BP 137/86 | HR 84 | Temp 97.9°F | Wt 179.8 lb

## 2023-03-07 DIAGNOSIS — Z3009 Encounter for other general counseling and advice on contraception: Secondary | ICD-10-CM | POA: Diagnosis not present

## 2023-03-07 DIAGNOSIS — K641 Second degree hemorrhoids: Secondary | ICD-10-CM

## 2023-03-07 DIAGNOSIS — K5900 Constipation, unspecified: Secondary | ICD-10-CM | POA: Insufficient documentation

## 2023-03-07 DIAGNOSIS — R1084 Generalized abdominal pain: Secondary | ICD-10-CM | POA: Diagnosis not present

## 2023-03-07 DIAGNOSIS — K5901 Slow transit constipation: Secondary | ICD-10-CM | POA: Diagnosis not present

## 2023-03-07 DIAGNOSIS — K649 Unspecified hemorrhoids: Secondary | ICD-10-CM | POA: Insufficient documentation

## 2023-03-07 LAB — MICROSCOPIC EXAMINATION: Bacteria, UA: NONE SEEN

## 2023-03-07 LAB — URINALYSIS, ROUTINE W REFLEX MICROSCOPIC
Bilirubin, UA: NEGATIVE
Glucose, UA: NEGATIVE
Ketones, UA: NEGATIVE
Leukocytes,UA: NEGATIVE
Nitrite, UA: NEGATIVE
Protein,UA: NEGATIVE
Specific Gravity, UA: >1.03 — ABNORMAL HIGH (ref 1.005–1.030)
Urobilinogen, Ur: 0.2 mg/dL (ref 0.2–1.0)
pH, UA: 5.5 (ref 5.0–7.5)

## 2023-03-07 MED ORDER — NAPROXEN 500 MG PO TABS: 500.0000 mg | ORAL_TABLET | Freq: Two times a day (BID) | ORAL | 0 refills | Status: AC

## 2023-03-07 MED ORDER — HYDROCORTISONE (PERIANAL) 2.5 % EX CREA: 1.0000 | TOPICAL_CREAM | Freq: Two times a day (BID) | CUTANEOUS | 4 refills | Status: DC

## 2023-03-07 NOTE — Assessment & Plan Note (Signed)
 One with mild inflammation noted today.  Will continue Anusol  and recommend she start Witch Hazel pads and Sitz baths.  UA overall reassuring today, 2+ blood only (recent cycle and some bleeding from hemorrhoid).  If ongoing or worsening discomfort then return to office.  Refer to constipation plan of care for further.

## 2023-03-07 NOTE — Assessment & Plan Note (Signed)
 Chronic, ongoing.  Discussed at length the goal of having regular daily BM without straining to prevent hemorrhoid irritation or pain.  Recommend she start Metamucil daily to ensure good fiber intake daily, educated her on this and where to obtain.  Continue good water intake daily and work on diet changes.

## 2023-03-07 NOTE — Patient Instructions (Addendum)
 Start Metamucil daily -- can do gummies or powder Get Witch Hazel pads or TUCKS + sitz bath.  Hemorrhoids Hemorrhoids are swollen veins that may form: In the butt (rectum). These are called internal hemorrhoids. Around the opening of the butt (anus). These are called external hemorrhoids. Most hemorrhoids do not cause very bad problems. They often get better with changes to your lifestyle and what you eat. What are the causes? Having trouble pooping (constipation) or watery poop (diarrhea). Pushing too hard when you poop. Pregnancy. Being very overweight (obese). Sitting for too long. Riding a bike for a long time. Heavy lifting or other things that take a lot of effort. Anal sex. What are the signs or symptoms? Pain. Itching or soreness in the butt. Bleeding from the butt. Leaking poop. Swelling. One or more lumps around the opening of your butt. How is this treated? In most cases, hemorrhoids can be treated at home. You may be told to: Change what you eat. Make changes to your lifestyle. If these treatments do not help, you may need to have a procedure done. Your doctor may need to: Place rubber bands at the bottom of the hemorrhoids to make them fall off. Put medicine into the hemorrhoids to shrink them. Shine a type of light on the hemorrhoids to cause them to fall off. Do surgery to get rid of the hemorrhoids. Follow these instructions at home: Medicines Take over-the-counter and prescription medicines only as told by your doctor. Use creams with medicine in them or medicines that you put in your butt as told by your doctor. Eating and drinking  Eat foods that have a lot of fiber in them. These include whole grains, beans, nuts, fruits, and vegetables. Ask your doctor about taking products that have fiber added to them (fibersupplements). Take in less fat. You can do this by: Eating low-fat dairy products. Eating less red meat. Staying away from processed  foods. Drink enough fluid to keep your pee (urine) pale yellow. Managing pain and swelling  Take a warm-water bath (sitz bath) for 20 minutes to ease pain. Do this 3-4 times a day. You may do this in a bathtub. You may also use a portable sitz bath that fits over the toilet. If told, put ice on the painful area. It may help to use ice between your warm baths. Put ice in a plastic bag. Place a towel between your skin and the bag. Leave the ice on for 20 minutes, 2-3 times a day. If your skin turns bright red, take off the ice right away to prevent skin damage. The risk of damage is higher if you cannot feel pain, heat, or cold. General instructions Exercise. Ask your doctor how much and what kind of exercise is best for you. Go to the bathroom when you need to poop. Do not wait. Try not to push too hard when you poop. Keep your butt dry and clean. Use wet toilet paper or moist towelettes after you poop. Do not sit on the toilet for a long time. Contact a doctor if: You have pain and swelling that do not get better with treatment. You have trouble pooping. You cannot poop. You have pain or swelling outside the area of the hemorrhoids. Get help right away if: You have bleeding from the butt that will not stop. This information is not intended to replace advice given to you by your health care provider. Make sure you discuss any questions you have with your health care provider.  Document Revised: 10/27/2021 Document Reviewed: 10/27/2021 Elsevier Patient Education  2024 [employer].

## 2023-03-07 NOTE — Progress Notes (Signed)
 BP 137/86   Pulse 84   Temp 97.9 F (36.6 C) (Oral)   Wt 179 lb 12.8 oz (81.6 kg)   LMP 02/27/2023 (Approximate)   SpO2 97%   BMI 33.97 kg/m    Subjective:    Patient ID: Mandy Stewart, female    DOB: 04-Jun-1985, 38 y.o.   MRN: [id number]  HPI: Mandy Stewart is a 38 y.o. female  Chief Complaint  Patient presents with   Menstrual Problem    Patient states she has been having issues with her birth control. States the new birth control she was put on only has 3 lines of medication and no sugar pills. States she is unsure if she supposed to skip a week or take the medication continuously. States she was started on this to help regulate her period.    Hemorrhoids   ABNORMAL MENSTRUAL PERIODS Is taking Hailey, has been on for month.  She is confused as no sugar pill present and she is not sure if she is to skip a week and then restart.  She is taking to help regulate cycles.  Did have severe cramps with recent cycles. G3P0030 Duration: months Average interval between menses: 28-31 days Length of menses: 3 to 6 days Flow: moderate Dysmenorrhea: yes Intermenstrual bleeding:no Postcoital bleeding: no Contraception: Hailey, Depo (had no cycles with this) Menarche at age:  Sexual activity: In a Monogamous Relationship History of sexually transmitted diseases: yes -- when younger History GYN procedures: no Abnormal pap smears: yes    HEMORRHOIDS She is concerned for hemorrhoid or kidney stone -- recently had episodes of pain lower left back that radiated around to stomach.  Does not have a gall bladder, she reports pain feels like that.  Has had hemorrhoid before -- with itching.  No itching this time, feels like something is coming out of rectal area. Duration: weeks Anal fullness: no not today, but prior to past 2-3 days had this Perianal itching/irritation: no Perianal pain: no current, but did before Bright red rectal bleeding: a little bit, but not a lot Constipation: comes  and goes Hard stools: no Chronic straining/valsava: yes Alleviating factors: nothing Aggravating factors: unknown Status: fluctuating Treatments attempted: hydrocortisone  cream  Previous hemorrhoids: yes  Colonoscopy: no   Relevant past medical, surgical, family and social history reviewed and updated as indicated. Interim medical history since our last visit reviewed. Allergies and medications reviewed and updated.  Review of Systems  Constitutional:  Negative for activity change, appetite change, diaphoresis, fatigue and fever.  Respiratory:  Negative for cough, chest tightness and shortness of breath.   Cardiovascular:  Negative for chest pain, palpitations and leg swelling.  Gastrointestinal:  Positive for anal bleeding (occasional) and constipation. Negative for abdominal distention, abdominal pain, diarrhea, nausea and vomiting.  Neurological: Negative.   Psychiatric/Behavioral: Negative.      Per HPI unless specifically indicated above     Objective:    BP 137/86   Pulse 84   Temp 97.9 F (36.6 C) (Oral)   Wt 179 lb 12.8 oz (81.6 kg)   LMP 02/27/2023 (Approximate)   SpO2 97%   BMI 33.97 kg/m   Wt Readings from Last 3 Encounters:  03/07/23 179 lb 12.8 oz (81.6 kg)  01/25/23 172 lb (78 kg)  01/11/23 174 lb 3.2 oz (79 kg)    Physical Exam Vitals and nursing note reviewed. Exam conducted with a chaperone present.  Constitutional:      General: She is awake. She is not in acute  distress.    Appearance: She is well-developed and well-groomed. She is obese. She is not ill-appearing or toxic-appearing.  HENT:     Head: Normocephalic.     Right Ear: Hearing and external ear normal.     Left Ear: Hearing and external ear normal.  Eyes:     General: Lids are normal.        Right eye: No discharge.        Left eye: No discharge.     Conjunctiva/sclera: Conjunctivae normal.     Pupils: Pupils are equal, round, and reactive to light.  Neck:     Thyroid: No  thyromegaly.     Vascular: No carotid bruit.  Cardiovascular:     Rate and Rhythm: Normal rate and regular rhythm.     Heart sounds: Normal heart sounds. No murmur heard.    No gallop.  Pulmonary:     Effort: Pulmonary effort is normal. No accessory muscle usage or respiratory distress.     Breath sounds: Normal breath sounds.  Abdominal:     General: Bowel sounds are normal. There is no distension.     Palpations: Abdomen is soft.     Tenderness: There is no abdominal tenderness.  Genitourinary:    Rectum: External hemorrhoid present. No tenderness.     Comments: External inflamed hemorrhoid at 12 o'clock position.  No bleeding noted. Musculoskeletal:     Cervical back: Normal range of motion and neck supple.     Right lower leg: No edema.     Left lower leg: No edema.  Lymphadenopathy:     Cervical: No cervical adenopathy.  Skin:    General: Skin is warm and dry.  Neurological:     Mental Status: She is alert and oriented to person, place, and time.     Deep Tendon Reflexes: Reflexes are normal and symmetric.     Reflex Scores:      Brachioradialis reflexes are 2+ on the right side and 2+ on the left side.      Patellar reflexes are 2+ on the right side and 2+ on the left side. Psychiatric:        Attention and Perception: Attention normal.        Mood and Affect: Mood normal.        Speech: Speech normal.        Behavior: Behavior normal. Behavior is cooperative.        Thought Content: Thought content normal.     Results for orders placed or performed in visit on 01/11/23  Urinalysis, Routine w reflex microscopic   Collection Time: 01/11/23  4:12 PM  Result Value Ref Range   Specific Gravity, UA >1.030 (H) 1.005 - 1.030   pH, UA 5.5 5.0 - 7.5   Color, UA Yellow Yellow   Appearance Ur Cloudy (A) Clear   Leukocytes,UA Negative Negative   Protein,UA Negative Negative/Trace   Glucose, UA Negative Negative   Ketones, UA Negative Negative   RBC, UA Negative Negative    Bilirubin, UA Negative Negative   Urobilinogen, Ur 0.2 0.2 - 1.0 mg/dL   Nitrite, UA Negative Negative   Microscopic Examination Comment   CBC with Differential/Platelet   Collection Time: 01/11/23  4:13 PM  Result Value Ref Range   WBC 6.0 3.4 - 10.8 x10E3/uL   RBC 4.70 3.77 - 5.28 x10E6/uL   Hemoglobin 13.7 11.1 - 15.9 g/dL   Hematocrit 58.0 65.9 - 46.6 %   MCV 89 79 - 97  fL   MCH 29.1 26.6 - 33.0 pg   MCHC 32.7 31.5 - 35.7 g/dL   RDW 86.9 88.2 - 84.5 %   Platelets 226 150 - 450 x10E3/uL   Neutrophils 53 Not Estab. %   Lymphs 39 Not Estab. %   Monocytes 7 Not Estab. %   Eos 1 Not Estab. %   Basos 0 Not Estab. %   Neutrophils Absolute 3.1 1.4 - 7.0 x10E3/uL   Lymphocytes Absolute 2.3 0.7 - 3.1 x10E3/uL   Monocytes Absolute 0.4 0.1 - 0.9 x10E3/uL   EOS (ABSOLUTE) 0.1 0.0 - 0.4 x10E3/uL   Basophils Absolute 0.0 0.0 - 0.2 x10E3/uL   Immature Granulocytes 0 Not Estab. %   Immature Grans (Abs) 0.0 0.0 - 0.1 x10E3/uL  Comprehensive metabolic panel   Collection Time: 01/11/23  4:13 PM  Result Value Ref Range   Glucose 91 70 - 99 mg/dL   BUN 14 6 - 20 mg/dL   Creatinine, Ser 9.23 0.57 - 1.00 mg/dL   eGFR 895 >40 fO/fpw/8.26   BUN/Creatinine Ratio 18 9 - 23   Sodium 141 134 - 144 mmol/L   Potassium 3.9 3.5 - 5.2 mmol/L   Chloride 102 96 - 106 mmol/L   CO2 24 20 - 29 mmol/L   Calcium 9.8 8.7 - 10.2 mg/dL   Total Protein 7.7 6.0 - 8.5 g/dL   Albumin 4.7 3.9 - 4.9 g/dL   Globulin, Total 3.0 1.5 - 4.5 g/dL   Bilirubin Total 0.3 0.0 - 1.2 mg/dL   Alkaline Phosphatase 64 44 - 121 IU/L   AST 14 0 - 40 IU/L   ALT 11 0 - 32 IU/L  Lipid panel   Collection Time: 01/11/23  4:13 PM  Result Value Ref Range   Cholesterol, Total 216 (H) 100 - 199 mg/dL   Triglycerides 865 0 - 149 mg/dL   HDL 68 >60 mg/dL   VLDL Cholesterol Cal 23 5 - 40 mg/dL   LDL Chol Calc (NIH) 874 (H) 0 - 99 mg/dL   Chol/HDL Ratio 3.2 0.0 - 4.4 ratio  TSH   Collection Time: 01/11/23  4:13 PM  Result Value  Ref Range   TSH 1.400 0.450 - 4.500 uIU/mL  HgB A1c   Collection Time: 01/11/23  4:13 PM  Result Value Ref Range   Hgb A1c MFr Bld 6.1 (H) 4.8 - 5.6 %   Est. average glucose Bld gHb Est-mCnc 128 mg/dL      Assessment & Plan:   Problem List Items Addressed This Visit       Cardiovascular and Mediastinum   Hemorrhoids   One with mild inflammation noted today.  Will continue Anusol  and recommend she start Witch Hazel pads and Sitz baths.  UA overall reassuring today, 2+ blood only (recent cycle and some bleeding from hemorrhoid).  If ongoing or worsening discomfort then return to office.  Refer to constipation plan of care for further.        Other   Birth control counseling   Counseled on current birth control and she is now aware she takes 1 week off and 3 weeks on.  If she has difficulty remembering to restart, then let us  know.      Constipation - Primary   Chronic, ongoing.  Discussed at length the goal of having regular daily BM without straining to prevent hemorrhoid irritation or pain.  Recommend she start Metamucil daily to ensure good fiber intake daily, educated her on this and where to  obtain.  Continue good water intake daily and work on diet changes.        Relevant Orders   Urinalysis, Routine w reflex microscopic     Follow up plan: Return if symptoms worsen or fail to improve.

## 2023-03-07 NOTE — Assessment & Plan Note (Signed)
 Counseled on current birth control and she is now aware she takes 1 week off and 3 weeks on.  If she has difficulty remembering to restart, then let us know.

## 2023-04-02 DIAGNOSIS — J069 Acute upper respiratory infection, unspecified: Secondary | ICD-10-CM | POA: Diagnosis not present

## 2023-04-27 DIAGNOSIS — F411 Generalized anxiety disorder: Secondary | ICD-10-CM | POA: Diagnosis not present

## 2023-04-27 DIAGNOSIS — F4312 Post-traumatic stress disorder, chronic: Secondary | ICD-10-CM | POA: Diagnosis not present

## 2023-04-27 DIAGNOSIS — F331 Major depressive disorder, recurrent, moderate: Secondary | ICD-10-CM | POA: Diagnosis not present

## 2023-06-04 ENCOUNTER — Ambulatory Visit (HOSPITAL_COMMUNITY)
Admission: EM | Admit: 2023-06-04 | Discharge: 2023-06-04 | Disposition: A | Discharge status: Home / Self Care | Attending: Physician Assistant | Admitting: Physician Assistant

## 2023-06-04 ENCOUNTER — Ambulatory Visit (INDEPENDENT_AMBULATORY_CARE_PROVIDER_SITE_OTHER)

## 2023-06-04 ENCOUNTER — Encounter (HOSPITAL_COMMUNITY): Payer: Self-pay

## 2023-06-04 DIAGNOSIS — R051 Acute cough: Secondary | ICD-10-CM

## 2023-06-04 DIAGNOSIS — J329 Chronic sinusitis, unspecified: Secondary | ICD-10-CM | POA: Diagnosis not present

## 2023-06-04 DIAGNOSIS — J40 Bronchitis, not specified as acute or chronic: Secondary | ICD-10-CM

## 2023-06-04 DIAGNOSIS — R059 Cough, unspecified: Secondary | ICD-10-CM | POA: Diagnosis not present

## 2023-06-04 MED ORDER — PROMETHAZINE-DM 6.25-15 MG/5ML PO SYRP: 5.0000 mL | ORAL_SOLUTION | Freq: Three times a day (TID) | ORAL | 0 refills | Status: DC | PRN

## 2023-06-04 MED ORDER — AMOXICILLIN-POT CLAVULANATE 875-125 MG PO TABS: 1.0000 | ORAL_TABLET | Freq: Two times a day (BID) | ORAL | 0 refills | Status: DC

## 2023-06-04 MED ORDER — IBUPROFEN 800 MG PO TABS: 800.0000 mg | ORAL_TABLET | Freq: Three times a day (TID) | ORAL | 0 refills | Status: AC | PRN

## 2023-06-04 NOTE — ED Triage Notes (Signed)
 Patient c/o a productive cough with thick green sputum. Patient states she has back and chest discomfort when coughing. And also reports body aches x 1 week.  Patient has been taking Dayquil/Nyquil and Mucinex DM for her symptoms.

## 2023-06-04 NOTE — ED Provider Notes (Signed)
 [facility]    CSN: [id number] Arrival date & time: 06/04/23  1757      History   Chief Complaint Chief Complaint  Patient presents with   Cough    HPI Mandy Stewart is a 38 y.o. female.   Patient presents today with a week plus long history of URI symptoms including cough, increased feeding production, congestion, body aches, chest tightness with coughing.  She denies any chest pain, fever, nausea, vomiting, diarrhea.  She has been feeling more short of breath since the cough has worsened in the past few days.  She has tried over-the-counter medications like DayQuil, NyQuil, Mucinex without improvement of symptoms.  She participated in a virtual visit who recommended she be evaluated in clinic so someone could auscultate her lungs given her constellation of symptoms.  She denies any recent antibiotics or steroids.  She has no concern for pregnancy.  Denies any history of asthma, COPD, smoking.  She does have seasonal allergies managed with as needed over-the-counter antihistamines which she has been taking consistently.    Past Medical History:  Diagnosis Date   HTN (hypertension)    Medical history non-contributory    Prediabetes     Patient Active Problem List   Diagnosis Date Noted   Birth control counseling 03/07/2023   Constipation 03/07/2023   Hemorrhoids 03/07/2023   Prediabetes 01/04/2022   BMI 30.0-30.9,adult 01/04/2022   Anxiety 12/04/2020   Depression, recurrent (HCC) 12/04/2020   Primary hypertension 12/04/2020   Thickened endometrium 06/12/2019   ASCUS with positive high risk HPV cervical pap on 06/04/2019 06/04/2019    Past Surgical History:  Procedure Laterality Date   CHOLECYSTECTOMY N/A 12/22/2014   Procedure: LAPAROSCOPIC CHOLECYSTECTOMY;  Surgeon: Franky Macho, MD;  Location: [facility];  Service: General;  Laterality: N/A;    OB History     Gravida  3   Para      Term      Preterm      AB  3   Living  0      SAB  3   IAB       Ectopic      Multiple      Live Births               Home Medications    Prior to Admission medications   Medication Sig Start Date End Date Taking? Authorizing Provider  amoxicillin-clavulanate (AUGMENTIN) 875-125 MG tablet Take 1 tablet by mouth every 12 (twelve) hours. 06/04/23  Yes Hebah Bogosian K, PA-C  promethazine-dextromethorphan (PROMETHAZINE-DM) 6.25-15 MG/5ML syrup Take 5 mLs by mouth 3 (three) times daily as needed for cough. 06/04/23  Yes Johnice Riebe K, PA-C  desvenlafaxine (PRISTIQ) 25 MG 24 hr tablet Take 25 mg by mouth every morning. 02/20/23   [provider]  hydrocortisone (ANUSOL-HC) 2.5 % rectal cream Place 1 Application rectally 2 (two) times daily. 03/07/23   Cannady, Corrie Dandy T, NP  ibuprofen (ADVIL) 800 MG tablet Take 1 tablet (800 mg total) by mouth every 8 (eight) hours as needed. 06/04/23   Tarshia Kot, Noberto Retort, PA-C  LORazepam (ATIVAN) 0.5 MG tablet Take 1 tablet (0.5 mg total) by mouth 2 (two) times daily as needed for anxiety. 01/25/23   Larae Grooms, NP  metFORMIN (GLUCOPHAGE) 500 MG tablet Take 1 tablet (500 mg total) by mouth 2 (two) times daily with a meal. 05/05/22   Larae Grooms, NP  naproxen (NAPROSYN) 500 MG tablet Take 1 tablet (500 mg total) by mouth 2 (  two) times daily with a meal. 03/07/23   Cannady, Corrie Dandy T, NP  Norethindrone Acetate-Ethinyl Estradiol (HAILEY 1.5/30) 1.5-30 MG-MCG tablet Take 1 tablet by mouth daily. 01/11/23   Larae Grooms, NP  promethazine (PHENERGAN) 25 MG tablet TAKE 1 TABLET BY MOUTH EVERY 6 HOURS AS NEEDED FOR NAUSEA OR VOMITING (MIGRAINES) 01/04/22   Larae Grooms, NP  montelukast (SINGULAIR) 10 MG tablet Take 10 mg by mouth at bedtime as needed (for allergy relief).   10/15/18  [provider]    Family History Family History  Problem Relation Age of Onset   Asthma Mother    Depression Mother    Anxiety disorder Mother    Heart disease Mother    COPD Mother    Bipolar disorder Mother     Hyperlipidemia Father    Diabetes Father    Diabetes Sister     Social History Social History   Tobacco Use   Smoking status: Never   Smokeless tobacco: Never  Vaping Use   Vaping status: Never Used  Substance Use Topics   Alcohol use: Not Currently    Alcohol/week: 1.0 standard drink of alcohol    Types: 1 Cans of beer per week    Comment: occasionally   Drug use: No     Allergies   Codeine   Review of Systems Review of Systems  Constitutional:  Positive for activity change. Negative for appetite change, fatigue and fever.  HENT:  Positive for congestion and sinus pressure. Negative for sneezing and sore throat.   Respiratory:  Positive for cough and chest tightness. Negative for shortness of breath and wheezing.   Cardiovascular:  Negative for chest pain.  Gastrointestinal:  Negative for abdominal pain, diarrhea, nausea and vomiting.  Neurological:  Negative for dizziness, light-headedness and headaches.     Physical Exam Triage Vital Signs ED Triage Vitals  Encounter Vitals Group     BP 06/04/23 1838 (!) 124/90     Systolic BP Percentile --      Diastolic BP Percentile --      Pulse Rate 06/04/23 1838 87     Resp 06/04/23 1838 16     Temp 06/04/23 1838 98.4 F (36.9 C)     Temp Source 06/04/23 1838 Oral     SpO2 06/04/23 1838 97 %     Weight --      Height --      Head Circumference --      Peak Flow --      Pain Score 06/04/23 1837 6     Pain Loc --      Pain Education --      Exclude from Growth Chart --    No data found.  Updated Vital Signs BP (!) 124/90 (BP Location: Left Arm)   Pulse 87   Temp 98.4 F (36.9 C) (Oral)   Resp 16   LMP 05/26/2023 (Approximate)   SpO2 97%   Visual Acuity Right Eye Distance:   Left Eye Distance:   Bilateral Distance:    Right Eye Near:   Left Eye Near:    Bilateral Near:     Physical Exam Vitals reviewed.  Constitutional:      General: She is awake. She is not in acute distress.    Appearance:  Normal appearance. She is well-developed. She is not ill-appearing.     Comments: Very pleasant female appears stated age in no acute distress sitting comfortably in exam room  HENT:     Head:  Normocephalic and atraumatic.     Right Ear: Ear canal and external ear normal. Tympanic membrane is scarred. Tympanic membrane is not erythematous or bulging.     Left Ear: Tympanic membrane, ear canal and external ear normal. Tympanic membrane is not erythematous or bulging.     Nose:     Right Sinus: No maxillary sinus tenderness or frontal sinus tenderness.     Left Sinus: No maxillary sinus tenderness or frontal sinus tenderness.     Mouth/Throat:     Pharynx: Uvula midline. Postnasal drip present. No oropharyngeal exudate or posterior oropharyngeal erythema.  Cardiovascular:     Rate and Rhythm: Normal rate and regular rhythm.     Heart sounds: Normal heart sounds, S1 normal and S2 normal. No murmur heard. Pulmonary:     Effort: Pulmonary effort is normal.     Breath sounds: Normal breath sounds. No wheezing, rhonchi or rales.     Comments: Reactive cough with deep breathing Psychiatric:        Behavior: Behavior is cooperative.      UC Treatments / Results  Labs (all labs ordered are listed, but only abnormal results are displayed) Labs Reviewed - No data to display  EKG   Radiology DG Chest 2 View Result Date: 06/04/2023 CLINICAL DATA:  Worsening cough. EXAM: CHEST - 2 VIEW COMPARISON:  Chest radiograph dated 12/08/2021. FINDINGS: The heart size and mediastinal contours are within normal limits. Both lungs are clear. The visualized skeletal structures are unremarkable. IMPRESSION: No active cardiopulmonary disease. Electronically Signed   By: Elgie Collard M.D.   On: 06/04/2023 19:14    Procedures Procedures (including critical care time)  Medications Ordered in UC Medications - No data to display  Initial Impression / Assessment and Plan / UC Course  I have reviewed the  triage vital signs and the nursing notes.  Pertinent labs & imaging results that were available during my care of the patient were reviewed by me and considered in my medical decision making (see chart for details).     Patient is well-appearing, afebrile, nontoxic, nontachycardic.  No indication for viral testing as patient has been symptomatic for over a week and this would not change management.  Chest x-ray was obtained that showed no acute cardiopulmonary disease.  Will treat for sinobronchitis given prolonged and worsening symptoms.  She was started on Augmentin twice daily for 7 days.  She was encouraged to use over-the-counter medication for symptom management.  She was given ibuprofen 800 mg to be taken up to 3 times a day for pain relief with instruction to take NSAIDs with this medication due to risk of GI bleeding but can use Tylenol, Mucinex, Flonase.  She was given Promethazine DM for cough.  We discussed that this can be sedating and she is not to drive drink alcohol taking it.  Discussed that if her symptoms are not improving within a week or if anything worsens she needs to be seen immediately.  Strict return precautions given.  Excuse note provided.  Final Clinical Impressions(s) / UC Diagnoses   Final diagnoses:  Acute cough  Sinobronchitis     Discharge Instructions      I did not see anything on your x-ray concerning for pneumonia.  I will contact you if the radiologist sees something else and we need to add an additional antibiotic.  Start Augmentin twice daily for 7 days.  Take this with food as it can upset your stomach.  Use ibuprofen for pain.  Do not take NSAIDs with this medication including aspirin, ibuprofen/Advil, naproxen/Aleve.  You can use Tylenol, Mucinex, nasal saline/sinus rinses for additional symptom relief.  Use Promethazine DM for cough.  This can make you sleepy so do not drive drink alcohol taking it.  Make sure that you rest and drink plenty fluid.  If  your symptoms are not improving within a week you should return for reevaluation.  If anything worsens you should be seen immediately including worsening cough, fever, chest pain, shortness of breath you need to be seen immediately.     ED Prescriptions     Medication Sig Dispense Auth. Provider   ibuprofen (ADVIL) 800 MG tablet Take 1 tablet (800 mg total) by mouth every 8 (eight) hours as needed. 21 tablet Daquann Merriott K, PA-C   promethazine-dextromethorphan (PROMETHAZINE-DM) 6.25-15 MG/5ML syrup Take 5 mLs by mouth 3 (three) times daily as needed for cough. 118 mL Anarely Nicholls K, PA-C   amoxicillin-clavulanate (AUGMENTIN) 875-125 MG tablet Take 1 tablet by mouth every 12 (twelve) hours. 14 tablet Maiya Kates, Noberto Retort, PA-C      PDMP not reviewed this encounter.   Jeani Hawking, PA-C 06/04/23 1924

## 2023-06-04 NOTE — Discharge Instructions (Signed)
 I did not see anything on your x-ray concerning for pneumonia.  I will contact you if the radiologist sees something else and we need to add an additional antibiotic.  Start Augmentin twice daily for 7 days.  Take this with food as it can upset your stomach.  Use ibuprofen for pain.  Do not take NSAIDs with this medication including aspirin, ibuprofen/Advil, naproxen/Aleve.  You can use Tylenol, Mucinex, nasal saline/sinus rinses for additional symptom relief.  Use Promethazine DM for cough.  This can make you sleepy so do not drive drink alcohol taking it.  Make sure that you rest and drink plenty fluid.  If your symptoms are not improving within a week you should return for reevaluation.  If anything worsens you should be seen immediately including worsening cough, fever, chest pain, shortness of breath you need to be seen immediately.

## 2023-06-12 ENCOUNTER — Other Ambulatory Visit: Payer: Self-pay | Admitting: Nurse Practitioner

## 2023-06-15 ENCOUNTER — Telehealth: Payer: Self-pay

## 2023-06-15 NOTE — Telephone Encounter (Signed)
 Copied from CRM [id number]. Topic: General - Other >> Jun 15, 2023  1:06 PM [name] wrote: Reason for CRM: Pt called stated she has yeast infection and would like medication sent to [id number] [place] - [name] [name] [address] Phone: [phone] Fax: [phone]

## 2023-06-15 NOTE — Telephone Encounter (Signed)
 Patient will need an appointment. Please assist and schedule.

## 2023-06-21 NOTE — Telephone Encounter (Signed)
 Called patient. Left patient message to call and schedule appt.

## 2023-06-26 DIAGNOSIS — F411 Generalized anxiety disorder: Secondary | ICD-10-CM | POA: Diagnosis not present

## 2023-06-26 DIAGNOSIS — F4312 Post-traumatic stress disorder, chronic: Secondary | ICD-10-CM | POA: Diagnosis not present

## 2023-06-26 DIAGNOSIS — F331 Major depressive disorder, recurrent, moderate: Secondary | ICD-10-CM | POA: Diagnosis not present

## 2023-07-26 ENCOUNTER — Other Ambulatory Visit: Payer: Self-pay | Admitting: Nurse Practitioner

## 2023-07-28 NOTE — Telephone Encounter (Signed)
 Requested Prescriptions  Pending Prescriptions Disp Refills   metFORMIN  (GLUCOPHAGE ) 500 MG tablet [Pharmacy Med Name: METFORMIN  HCL 500 MG TABLET] 180 tablet 0    Sig: TAKE 1 TABLET BY MOUTH 2 TIMES DAILY WITH A MEAL.     Endocrinology:  Diabetes - Biguanides Failed - 07/28/2023 10:57 AM      Failed - HBA1C is between 0 and 7.9 and within 180 days    HB A1C (BAYER DCA - WAIVED)  Date Value Ref Range Status  01/01/2021 6.1 (H) 4.8 - 5.6 % Final    Comment:             Prediabetes: 5.7 - 6.4          Diabetes: >6.4          Glycemic control for adults with diabetes: <7.0    Hgb A1c MFr Bld  Date Value Ref Range Status  01/11/2023 6.1 (H) 4.8 - 5.6 % Final    Comment:             Prediabetes: 5.7 - 6.4          Diabetes: >6.4          Glycemic control for adults with diabetes: <7.0          Failed - B12 Level in normal range and within 720 days    No results found for: "VITAMINB12"       Failed - Valid encounter within last 6 months    Recent Outpatient Visits   None            Passed - Cr in normal range and within 360 days    Creatinine, Ser  Date Value Ref Range Status  01/11/2023 0.76 0.57 - 1.00 mg/dL Final         Passed - eGFR in normal range and within 360 days    GFR calc Af Amer  Date Value Ref Range Status  06/25/2017 >60 >60 mL/min Final    Comment:    (NOTE) The eGFR has been calculated using the CKD EPI equation. This calculation has not been validated in all clinical situations. eGFR's persistently <60 mL/min signify possible Chronic Kidney Disease.    GFR, Estimated  Date Value Ref Range Status  06/19/2022 >60 >60 mL/min Final    Comment:    (NOTE) Calculated using the CKD-EPI Creatinine Equation (2021)    eGFR  Date Value Ref Range Status  01/11/2023 104 >59 mL/min/1.73 Final         Passed - CBC within normal limits and completed in the last 12 months    WBC  Date Value Ref Range Status  01/11/2023 6.0 3.4 - 10.8 x10E3/uL Final   06/19/2022 5.1 4.0 - 10.5 K/uL Final   RBC  Date Value Ref Range Status  01/11/2023 4.70 3.77 - 5.28 x10E6/uL Final  06/19/2022 4.30 3.87 - 5.11 MIL/uL Final   Hemoglobin  Date Value Ref Range Status  01/11/2023 13.7 11.1 - 15.9 g/dL Final   Hematocrit  Date Value Ref Range Status  01/11/2023 41.9 34.0 - 46.6 % Final   MCHC  Date Value Ref Range Status  01/11/2023 32.7 31.5 - 35.7 g/dL Final  19/14/7829 56.2 30.0 - 36.0 g/dL Final   [facility]  Date Value Ref Range Status  01/11/2023 29.1 26.6 - 33.0 pg Final  06/19/2022 30.2 26.0 - 34.0 pg Final   [facility]  Date Value Ref Range Status  01/11/2023 89 79 - 97 fL Final  No results found for: "PLTCOUNTKUC", "LABPLAT", "POCPLA" RDW  Date Value Ref Range Status  01/11/2023 13.0 11.7 - 15.4 % Final

## 2023-08-01 ENCOUNTER — Ambulatory Visit: Payer: Self-pay

## 2023-08-01 ENCOUNTER — Ambulatory Visit: Admitting: Nurse Practitioner

## 2023-08-01 ENCOUNTER — Encounter: Payer: Self-pay | Admitting: Nurse Practitioner

## 2023-08-01 VITALS — BP 138/80 | HR 78 | Temp 98.3°F | Wt 185.8 lb

## 2023-08-01 DIAGNOSIS — G43009 Migraine without aura, not intractable, without status migrainosus: Secondary | ICD-10-CM | POA: Diagnosis not present

## 2023-08-01 MED ORDER — KETOROLAC TROMETHAMINE 60 MG/2ML IM SOLN
60.0000 mg | Freq: Once | INTRAMUSCULAR | Status: AC
  Administered 2023-08-01: 60 mg via INTRAMUSCULAR

## 2023-08-01 MED ORDER — PROPRANOLOL HCL 10 MG PO TABS: 10.0000 mg | ORAL_TABLET | Freq: Every day | ORAL | 2 refills | Status: DC

## 2023-08-01 NOTE — Progress Notes (Signed)
 BP 138/80 (BP Location: Left Arm)   Pulse 78   Temp 98.3 F (36.8 C) (Oral)   Wt 185 lb 12.8 oz (84.3 kg)   SpO2 98%   BMI 35.11 kg/m    Subjective:    Patient ID: Mandy Stewart, female    DOB: 09/20/1985, 38 y.o.   MRN: [id number]  HPI: Mandy Stewart is a 38 y.o. female  Chief Complaint  Patient presents with   Migraine    Patient states she states she has been having a migraine since Sunday. States she has been having nausea as well.   HEADACHE Presents today for headache for several days, since Sunday = 3 days.  History of migraines off and on since she can remember.  Yesterday was the worst.  Has never taken medication on a daily basis for migraines.  Has migraines about once a month, takes Excedrin and it will go away.  Full dose of Phenergan  makes her tired and she can not work.  Cannot take Zofran  due to it making her nauseous. Duration: days Onset: sudden Severity: 5/10 Quality: dull and aching and pulsating Frequency: intermittent -- no migraine in a month or so Location: to posterior occiput with radiation to back of eyes Headache duration: >24 hours Radiation: as above Time of day headache occurs: varies Alleviating factors: Excedrin helps at baseline, but not this time Aggravating factors: unsure Headache status at time of visit: current headache Treatments attempted: Excedrin, Phenergan , Naproxen    Aura: no Nausea:  yes Vomiting: no Photophobia:  yes Phonophobia:  yes Effect on social functioning:  yes Numbers of missed days of school/work each month: late to work today by 2 hours Confusion:  no Gait disturbance/ataxia:  no Behavioral changes:  no Fevers:  no   Relevant past medical, surgical, family and social history reviewed and updated as indicated. Interim medical history since our last visit reviewed. Allergies and medications reviewed and updated.  Review of Systems  Constitutional:  Negative for activity change, appetite change, diaphoresis,  fatigue and fever.  Respiratory:  Negative for cough, chest tightness, shortness of breath and wheezing.   Cardiovascular:  Negative for chest pain, palpitations and leg swelling.  Gastrointestinal:  Positive for nausea. Negative for vomiting.  Neurological:  Positive for headaches. Negative for dizziness, weakness, light-headedness and numbness.  Psychiatric/Behavioral: Negative.      Per HPI unless specifically indicated above     Objective:     BP 138/80 (BP Location: Left Arm)   Pulse 78   Temp 98.3 F (36.8 C) (Oral)   Wt 185 lb 12.8 oz (84.3 kg)   SpO2 98%   BMI 35.11 kg/m   Wt Readings from Last 3 Encounters:  08/01/23 185 lb 12.8 oz (84.3 kg)  03/07/23 179 lb 12.8 oz (81.6 kg)  01/25/23 172 lb (78 kg)    Physical Exam Vitals and nursing note reviewed.  Constitutional:      General: She is awake. She is not in acute distress.    Appearance: She is well-developed and well-groomed. She is obese. She is not ill-appearing or toxic-appearing.  HENT:     Head: Normocephalic.     Right Ear: Hearing and external ear normal.     Left Ear: Hearing and external ear normal.  Eyes:     General: Lids are normal.        Right eye: No discharge.        Left eye: No discharge.     Extraocular Movements: Extraocular movements  intact.     Conjunctiva/sclera: Conjunctivae normal.     Pupils: Pupils are equal, round, and reactive to light.  Neck:     Thyroid: No thyromegaly.     Vascular: No carotid bruit.  Cardiovascular:     Rate and Rhythm: Normal rate and regular rhythm.     Heart sounds: Normal heart sounds. No murmur heard.    No gallop.  Pulmonary:     Effort: Pulmonary effort is normal. No accessory muscle usage or respiratory distress.     Breath sounds: Normal breath sounds.  Abdominal:     General: Bowel sounds are normal. There is no distension.     Palpations: Abdomen is soft.     Tenderness: There is no abdominal tenderness.  Musculoskeletal:     Cervical  back: Normal range of motion and neck supple.     Right lower leg: No edema.     Left lower leg: No edema.  Lymphadenopathy:     Cervical: No cervical adenopathy.  Skin:    General: Skin is warm and dry.  Neurological:     Mental Status: She is alert and oriented to person, place, and time.     Cranial Nerves: Cranial nerves 2-12 are intact.     Motor: Motor function is intact.     Coordination: Coordination is intact.     Gait: Gait is intact.     Deep Tendon Reflexes: Reflexes are normal and symmetric.     Reflex Scores:      Brachioradialis reflexes are 2+ on the right side and 2+ on the left side.      Patellar reflexes are 2+ on the right side and 2+ on the left side. Psychiatric:        Attention and Perception: Attention normal.        Mood and Affect: Mood normal.        Speech: Speech normal.        Behavior: Behavior normal. Behavior is cooperative.        Thought Content: Thought content normal.    Results for orders placed or performed in visit on 03/07/23  Microscopic Examination   Collection Time: 03/07/23  3:42 PM   Urine  Result Value Ref Range   WBC, UA 0-5 0 - 5 /hpf   RBC, Urine 0-2 0 - 2 /hpf   Epithelial Cells (non renal) 0-10 0 - 10 /hpf   Mucus, UA Present (A) Not Estab.   Bacteria, UA None seen None seen/Few  Urinalysis, Routine w reflex microscopic   Collection Time: 03/07/23  3:42 PM  Result Value Ref Range   Specific Gravity, UA >1.030 (H) 1.005 - 1.030   pH, UA 5.5 5.0 - 7.5   Color, UA Yellow Yellow   Appearance Ur Cloudy (A) Clear   Leukocytes,UA Negative Negative   Protein,UA Negative Negative/Trace   Glucose, UA Negative Negative   Ketones, UA Negative Negative   RBC, UA 2+ (A) Negative   Bilirubin, UA Negative Negative   Urobilinogen, Ur 0.2 0.2 - 1.0 mg/dL   Nitrite, UA Negative Negative   Microscopic Examination See below:       Assessment & Plan:   Problem List Items Addressed This Visit       Cardiovascular and Mediastinum    Migraine without aura and without status migrainosus, not intractable - Primary   Chronic, reports migraines all her life and has not taken medication for this. ?tension vs migraines.  Neuro exam reassuring today,  no red flags.  Discussed treatment options with her.  May benefit a preventative.   - Will start Propranolol at low dose 10 MG daily and increase if tolerates. Current HR in 70's in auscultation.  Educated her on medication and possible side effects to alert provider of. - Would avoid Amitriptyline or Effexor  as is on Pristiq + took Effexor  for mental health in past with no benefit. - Could consider Gabapentin at night if Propranolol causes side effects or offers no benefit.  Discussed this medication with her. - Continue current PRN acute regimen at home, could consider triptan in future if BP stable.  Recommend she try taking 1/2 dose Phenergan  instead of full dose for nausea, may cause less fatigue. - Toradol  in office today.      Relevant Medications   desvenlafaxine (PRISTIQ) 50 MG 24 hr tablet   propranolol (INDERAL) 10 MG tablet     Follow up plan: Return in about 4 weeks (around 08/29/2023) for MIGRAINES.

## 2023-08-01 NOTE — Telephone Encounter (Signed)
 Copied from CRM [id number]. Topic: Clinical - Red Word Triage >> Aug 01, 2023  9:42 AM Oddis Bench wrote: Red Word that prompted transfer to Nurse Triage: Patient is calling about migrane she has been having it since Sunday, it makes her nauseated and her neck feels stiff also feels like hot flashes. Medications are not working.  Chief Complaint: Ongoing migraine Symptoms: Nausea, chills, hot flashes, stiff neck, eye pain Frequency: Since Sunday Pertinent Negatives: Patient denies vomiting Disposition: [] ED /[] Urgent Care (no appt availability in office) / [x] Appointment(In office/virtual)/ []  [facility]/ [] Home Care/ [] Refused Recommended Disposition /[] [facility]/ []  Follow-up with PCP Additional Notes: Patient called in to report that she has been experiencing a migraine since Sunday afternoon. Patient stated she has a history of headaches. Patient also reported nausea, chills, hot flashes, stiff neck and eye pain. Patient stated she still has full range of motion in her neck. Advised patient to be seen within 24 hours, per protocol. No availability in office. This RN called [facility] for scheduling assistance. Office was able to schedule patient for this afternoon. Provided care advice and instructed patient to call back if symptoms worsen. Patient complied.   Reason for Disposition  [1] MODERATE headache (e.g., interferes with normal activities) AND [2] present > 24 hours AND [3] unexplained  (Exceptions: analgesics not tried, typical migraine, or headache part of viral illness)  Answer Assessment - Initial Assessment Questions 1. LOCATION: "Where does it hurt?"      More towards back of head and neck 2. ONSET: "When did the headache start?" (Minutes, hours or days)      Sunday afternoon 3. PATTERN: "Does the pain come and go, or has it been constant since it started?"     Coming and going, mostly constant yesterday 4. SEVERITY: "How bad is the pain?" and "What does it keep  you from doing?"  (e.g., Scale 1-10; mild, moderate, or severe)   - MILD (1-3): doesn't interfere with normal activities    - MODERATE (4-7): interferes with normal activities or awakens from sleep    - SEVERE (8-10): excruciating pain, unable to do any normal activities        Rates pain a 4, yesterday pain was a 10  6. CAUSE: "What do you think is causing the headache?"     Migraine 7. MIGRAINE: "Have you been diagnosed with migraine headaches?" If Yes, ask: "Is this headache similar?"      States she has a history of headaches 9. OTHER SYMPTOMS: "Do you have any other symptoms?" (fever, stiff neck, eye pain, sore throat, cold symptoms)     Nausea, chills, hot flashes, stiff neck (still has range of motion), eye pain States normal precautions and treatments have not helped  Protocols used: Headache-A-AH

## 2023-08-01 NOTE — Patient Instructions (Signed)
 Migraine Headache A migraine headache is a very strong throbbing pain on one or both sides of your head. This type of headache can also cause other symptoms. It can last from 4 hours to 3 days. Talk with your doctor about what things may bring on (trigger) this condition. What are the causes? The exact cause of a migraine is not known. This condition may be brought on or caused by: Smoking. Medicines, such as: Medicine used to treat chest pain (nitroglycerin). Birth control pills. Estrogen. Some blood pressure medicines. Certain substances in some foods or drinks. Foods and drinks, such as: Cheese. Chocolate. Alcohol. Caffeine. Doing physical activity that is very hard. Other things that may trigger a migraine headache include: Periods. Pregnancy. Hunger. Stress. Getting too much or too little sleep. Weather changes. Feeling tired (fatigue). What increases the risk? Being 18-65 years old. Being female. Having a family history of migraine headaches. Being Caucasian. Having a mental health condition, such as being sad (depressed) or feeling worried or nervous (anxious). Being very overweight (obese). What are the signs or symptoms? A throbbing pain. This pain may: Happen in any area of the head, such as on one or both sides. Make it hard to do daily activities. Get worse with physical activity. Get worse around bright lights, loud noises, or smells. Other symptoms may include: Feeling like you may vomit (nauseous). Vomiting. Dizziness. Before a migraine headache starts, you may get warning signs (an aura). An aura may include: Seeing flashing lights or having blind spots. Seeing bright spots, halos, or zigzag lines. Having tunnel vision or blurred vision. Having numbness or a tingling feeling. Having trouble talking. Having weak muscles. After a migraine ends, you may have symptoms. These may include: Tiredness. Trouble thinking (concentrating). How is this  treated? Taking medicines that: Relieve pain. Relieve the feeling like you may vomit. Prevent migraine headaches. Treatment may also include: Acupuncture. Lifestyle changes like avoiding foods that bring on migraine headaches. Learning ways to control your body functions (biofeedback). Therapy to help you know and deal with negative thoughts (cognitive behavioral therapy). Follow these instructions at home: Medicines Take over-the-counter and prescription medicines only as told by your doctor. If told, take steps to prevent problems with pooping (constipation). You may need to: Drink enough fluid to keep your pee (urine) pale yellow. Take medicines. You will be told what medicines to take. Eat foods that are high in fiber. These include beans, whole grains, and fresh fruits and vegetables. Limit foods that are high in fat and sugar. These include fried or sweet foods. Ask your doctor if you should avoid driving or using machines while you are taking your medicine. Lifestyle  Do not drink alcohol. Do not smoke or use any products that contain nicotine or tobacco. If you need help quitting, ask your doctor. Get 7-9 hours of sleep each night, or the amount recommended by your doctor. Find ways to deal with stress, such as meditation, deep breathing, or yoga. Try to exercise often. This can help lessen how bad and how often your migraines happen. General instructions Keep a journal to find out what may bring on your migraine headaches. This can help you avoid those things. For example, write down: What you eat and drink. How much sleep you get. Any change to your medicines or diet. If you have a migraine headache: Avoid things that make your symptoms worse, such as bright lights. Lie down in a dark, quiet room. Do not drive or use machinery. Ask your  doctor what activities are safe for you. Where to find more information Coalition for Headache and Migraine Patients (CHAMP):  headachemigraine.org American Migraine Foundation: americanmigrainefoundation.org National Headache Foundation: headaches.org Contact a doctor if: You get a migraine headache that is different or worse than others you have had. You have more than 15 days of headaches in one month. Get help right away if: Your migraine headache gets very bad. Your migraine headache lasts more than 72 hours. You have a fever or stiff neck. You have trouble seeing. Your muscles feel weak or like you cannot control them. You lose your balance a lot. You have trouble walking. You faint. You have a seizure. This information is not intended to replace advice given to you by your health care provider. Make sure you discuss any questions you have with your health care provider. Document Revised: 10/11/2021 Document Reviewed: 10/11/2021 Elsevier Patient Education  2024 [employer].

## 2023-08-01 NOTE — Assessment & Plan Note (Addendum)
 Chronic, reports migraines all her life and has not taken medication for this. ?tension vs migraines.  Neuro exam reassuring today, no red flags.  Discussed treatment options with her.  May benefit a preventative.   - Will start Propranolol at low dose 10 MG daily and increase if tolerates. Current HR in 70's in auscultation.  Educated her on medication and possible side effects to alert provider of. - Would avoid Amitriptyline or Effexor  as is on Pristiq + took Effexor  for mental health in past with no benefit. - Could consider Gabapentin at night if Propranolol causes side effects or offers no benefit.  Discussed this medication with her. - Continue current PRN acute regimen at home, could consider triptan in future if BP stable.  Recommend she try taking 1/2 dose Phenergan  instead of full dose for nausea, may cause less fatigue. - Toradol  in office today.

## 2023-08-15 DIAGNOSIS — F4312 Post-traumatic stress disorder, chronic: Secondary | ICD-10-CM | POA: Diagnosis not present

## 2023-08-15 DIAGNOSIS — F411 Generalized anxiety disorder: Secondary | ICD-10-CM | POA: Diagnosis not present

## 2023-08-15 DIAGNOSIS — F331 Major depressive disorder, recurrent, moderate: Secondary | ICD-10-CM | POA: Diagnosis not present

## 2023-08-29 ENCOUNTER — Ambulatory Visit: Admitting: Nurse Practitioner

## 2023-08-29 NOTE — Progress Notes (Deleted)
 There were no vitals taken for this visit.   Subjective:    Patient ID: Mandy Stewart, female    DOB: 1985-08-18, 38 y.o.   MRN: [id number]  HPI: Mandy Stewart is a 38 y.o. female  No chief complaint on file.  HEADACHE Presents today for headache for several days, since Sunday = 3 days.  History of migraines off and on since she can remember.  Yesterday was the worst.  Has never taken medication on a daily basis for migraines.  Has migraines about once a month, takes Excedrin and it will go away.  Full dose of Phenergan  makes her tired and she can not work.  Cannot take Zofran  due to it making her nauseous. Duration: days Onset: sudden Severity: 5/10 Quality: dull and aching and pulsating Frequency: intermittent -- no migraine in a month or so Location: to posterior occiput with radiation to back of eyes Headache duration: >24 hours Radiation: as above Time of day headache occurs: varies Alleviating factors: Excedrin helps at baseline, but not this time Aggravating factors: unsure Headache status at time of visit: current headache Treatments attempted: Excedrin, Phenergan , Naproxen    Aura: no Nausea:  yes Vomiting: no Photophobia:  yes Phonophobia:  yes Effect on social functioning:  yes Numbers of missed days of school/work each month: late to work today by 2 hours Confusion:  no Gait disturbance/ataxia:  no Behavioral changes:  no Fevers:  no   Relevant past medical, surgical, family and social history reviewed and updated as indicated. Interim medical history since our last visit reviewed. Allergies and medications reviewed and updated.  Review of Systems  Constitutional:  Negative for activity change, appetite change, diaphoresis, fatigue and fever.  Respiratory:  Negative for cough, chest tightness, shortness of breath and wheezing.   Cardiovascular:  Negative for chest pain, palpitations and leg swelling.  Gastrointestinal:  Positive for nausea. Negative for vomiting.   Neurological:  Positive for headaches. Negative for dizziness, weakness, light-headedness and numbness.  Psychiatric/Behavioral: Negative.      Per HPI unless specifically indicated above     Objective:     There were no vitals taken for this visit.  Wt Readings from Last 3 Encounters:  08/01/23 185 lb 12.8 oz (84.3 kg)  03/07/23 179 lb 12.8 oz (81.6 kg)  01/25/23 172 lb (78 kg)    Physical Exam Vitals and nursing note reviewed.  Constitutional:      General: She is awake. She is not in acute distress.    Appearance: She is well-developed and well-groomed. She is obese. She is not ill-appearing or toxic-appearing.  HENT:     Head: Normocephalic.     Right Ear: Hearing and external ear normal.     Left Ear: Hearing and external ear normal.   Eyes:     General: Lids are normal.        Right eye: No discharge.        Left eye: No discharge.     Extraocular Movements: Extraocular movements intact.     Conjunctiva/sclera: Conjunctivae normal.     Pupils: Pupils are equal, round, and reactive to light.   Neck:     Thyroid: No thyromegaly.     Vascular: No carotid bruit.   Cardiovascular:     Rate and Rhythm: Normal rate and regular rhythm.     Heart sounds: Normal heart sounds. No murmur heard.    No gallop.  Pulmonary:     Effort: Pulmonary effort is normal. No accessory muscle  usage or respiratory distress.     Breath sounds: Normal breath sounds.  Abdominal:     General: Bowel sounds are normal. There is no distension.     Palpations: Abdomen is soft.     Tenderness: There is no abdominal tenderness.   Musculoskeletal:     Cervical back: Normal range of motion and neck supple.     Right lower leg: No edema.     Left lower leg: No edema.  Lymphadenopathy:     Cervical: No cervical adenopathy.   Skin:    General: Skin is warm and dry.   Neurological:     Mental Status: She is alert and oriented to person, place, and time.     Cranial Nerves: Cranial nerves  2-12 are intact.     Motor: Motor function is intact.     Coordination: Coordination is intact.     Gait: Gait is intact.     Deep Tendon Reflexes: Reflexes are normal and symmetric.     Reflex Scores:      Brachioradialis reflexes are 2+ on the right side and 2+ on the left side.      Patellar reflexes are 2+ on the right side and 2+ on the left side.  Psychiatric:        Attention and Perception: Attention normal.        Mood and Affect: Mood normal.        Speech: Speech normal.        Behavior: Behavior normal. Behavior is cooperative.        Thought Content: Thought content normal.   Results for orders placed or performed in visit on 03/07/23  Microscopic Examination   Collection Time: 03/07/23  3:42 PM   Urine  Result Value Ref Range   WBC, UA 0-5 0 - 5 /hpf   RBC, Urine 0-2 0 - 2 /hpf   Epithelial Cells (non renal) 0-10 0 - 10 /hpf   Mucus, UA Present (A) Not Estab.   Bacteria, UA None seen None seen/Few  Urinalysis, Routine w reflex microscopic   Collection Time: 03/07/23  3:42 PM  Result Value Ref Range   Specific Gravity, UA >1.030 (H) 1.005 - 1.030   pH, UA 5.5 5.0 - 7.5   Color, UA Yellow Yellow   Appearance Ur Cloudy (A) Clear   Leukocytes,UA Negative Negative   Protein,UA Negative Negative/Trace   Glucose, UA Negative Negative   Ketones, UA Negative Negative   RBC, UA 2+ (A) Negative   Bilirubin, UA Negative Negative   Urobilinogen, Ur 0.2 0.2 - 1.0 mg/dL   Nitrite, UA Negative Negative   Microscopic Examination See below:       Assessment & Plan:   Problem List Items Addressed This Visit   None     Follow up plan: No follow-ups on file.

## 2023-10-30 ENCOUNTER — Other Ambulatory Visit: Payer: Self-pay | Admitting: Nurse Practitioner

## 2023-10-31 NOTE — Telephone Encounter (Signed)
 Patient is overdue for an appointment. Please call to schedule and then route to provider for refill.

## 2023-10-31 NOTE — Telephone Encounter (Signed)
 Requested medication (s) are due for refill today -yes  Requested medication (s) are on the active medication list -yes  Future visit scheduled -no  Last refill: 08/01/23 #90  Notes to clinic: new start medication- no f/u appointment- does provider want to continue?  Requested Prescriptions  Pending Prescriptions Disp Refills   propranolol  (INDERAL ) 10 MG tablet [Pharmacy Med Name: PROPRANOLOL  10 MG TABLET] 90 tablet     Sig: TAKE 1 TABLET BY MOUTH EVERY DAY     Cardiovascular:  Beta Blockers Failed - 10/31/2023  1:50 PM      Failed - Valid encounter within last 6 months    Recent Outpatient Visits           3 months ago Migraine without aura and without status migrainosus, not intractable   [facility] [place], Coyle T, NP              Passed - Last BP in normal range    BP Readings from Last 1 Encounters:  08/01/23 138/80         Passed - Last Heart Rate in normal range    Pulse Readings from Last 1 Encounters:  08/01/23 78            Requested Prescriptions  Pending Prescriptions Disp Refills   propranolol  (INDERAL ) 10 MG tablet [Pharmacy Med Name: PROPRANOLOL  10 MG TABLET] 90 tablet     Sig: TAKE 1 TABLET BY MOUTH EVERY DAY     Cardiovascular:  Beta Blockers Failed - 10/31/2023  1:50 PM      Failed - Valid encounter within last 6 months    Recent Outpatient Visits           3 months ago Migraine without aura and without status migrainosus, not intractable   [facility] [place], McGregor T, NP              Passed - Last BP in normal range    BP Readings from Last 1 Encounters:  08/01/23 138/80         Passed - Last Heart Rate in normal range    Pulse Readings from Last 1 Encounters:  08/01/23 78

## 2023-11-27 ENCOUNTER — Ambulatory Visit: Admitting: Nurse Practitioner

## 2023-12-21 DIAGNOSIS — F4310 Post-traumatic stress disorder, unspecified: Secondary | ICD-10-CM | POA: Diagnosis not present

## 2023-12-21 DIAGNOSIS — F332 Major depressive disorder, recurrent severe without psychotic features: Secondary | ICD-10-CM | POA: Diagnosis not present

## 2023-12-21 DIAGNOSIS — F411 Generalized anxiety disorder: Secondary | ICD-10-CM | POA: Diagnosis not present

## 2023-12-21 DIAGNOSIS — F4381 Prolonged grief disorder: Secondary | ICD-10-CM | POA: Diagnosis not present

## 2023-12-27 ENCOUNTER — Other Ambulatory Visit: Payer: Self-pay | Admitting: Nurse Practitioner

## 2023-12-28 DIAGNOSIS — F4310 Post-traumatic stress disorder, unspecified: Secondary | ICD-10-CM | POA: Diagnosis not present

## 2023-12-28 DIAGNOSIS — F4381 Prolonged grief disorder: Secondary | ICD-10-CM | POA: Diagnosis not present

## 2023-12-28 DIAGNOSIS — F411 Generalized anxiety disorder: Secondary | ICD-10-CM | POA: Diagnosis not present

## 2023-12-28 NOTE — Telephone Encounter (Signed)
 Unable to refill per protocol, appointment needed.   Requested Prescriptions  Pending Prescriptions Disp Refills   AUROVELA 1.5/30 1.5-30 MG-MCG tablet [Pharmacy Med Name: AUROVELA 21 1.5-30 TABLET] 84 tablet 3    Sig: TAKE 1 TABLET BY MOUTH EVERY DAY     OB/GYN:  Contraceptives Failed - 12/28/2023  1:49 PM      Failed - Valid encounter within last 12 months    Recent Outpatient Visits           4 months ago Migraine without aura and without status migrainosus, not intractable   [facility] [place], George Mason T, NP              Passed - Last BP in normal range    BP Readings from Last 1 Encounters:  08/01/23 138/80         Passed - Patient is not a smoker

## 2024-01-10 DIAGNOSIS — F4381 Prolonged grief disorder: Secondary | ICD-10-CM | POA: Diagnosis not present

## 2024-01-10 DIAGNOSIS — F4310 Post-traumatic stress disorder, unspecified: Secondary | ICD-10-CM | POA: Diagnosis not present

## 2024-01-10 DIAGNOSIS — F411 Generalized anxiety disorder: Secondary | ICD-10-CM | POA: Diagnosis not present

## 2024-04-04 ENCOUNTER — Encounter: Payer: Self-pay | Admitting: Nurse Practitioner

## 2024-04-04 ENCOUNTER — Ambulatory Visit: Payer: Self-pay | Admitting: Nurse Practitioner

## 2024-04-04 VITALS — BP 135/88 | HR 70 | Temp 98.0°F | Ht 63.78 in | Wt 204.0 lb

## 2024-04-04 DIAGNOSIS — I10 Essential (primary) hypertension: Secondary | ICD-10-CM | POA: Diagnosis not present

## 2024-04-04 DIAGNOSIS — F419 Anxiety disorder, unspecified: Secondary | ICD-10-CM | POA: Diagnosis not present

## 2024-04-04 DIAGNOSIS — R7303 Prediabetes: Secondary | ICD-10-CM

## 2024-04-04 DIAGNOSIS — Z136 Encounter for screening for cardiovascular disorders: Secondary | ICD-10-CM

## 2024-04-04 DIAGNOSIS — Z0000 Encounter for general adult medical examination without abnormal findings: Secondary | ICD-10-CM | POA: Diagnosis not present

## 2024-04-04 MED ORDER — METFORMIN HCL 500 MG PO TABS: 500.0000 mg | ORAL_TABLET | Freq: Two times a day (BID) | ORAL | 1 refills | Status: AC

## 2024-04-04 NOTE — Progress Notes (Signed)
 "  BP 135/88 (BP Location: Right Arm, Patient Position: Sitting, Cuff Size: Normal)   Pulse 70   Temp 98 F (36.7 C) (Oral)   Ht 5' 3.78 (1.62 m)   Wt 204 lb (92.5 kg)   LMP 03/18/2024 (Approximate)   SpO2 94%   BMI 35.26 kg/m    Subjective:    Patient ID: Mandy Stewart, female    DOB: [birth year], 39 y.o.   MRN: [id number]  HPI: Mandy Stewart is a 39 y.o. female presenting on 04/04/2024 for comprehensive medical examination. Current medical complaints include:none  She currently lives with: Menopausal Symptoms: no  Patient states she is checking her blood pressure at home and its running 130/80.   Denies HA, CP, SOB, dizziness, palpitations, visual changes, and lower extremity swelling.   DEPRESSION Not currently taking anything for depression.  She rarely uses Lorazepam .  She feels like as long as she has the Lorazepam  in her possession she is able to calm down.  Doesn't feel like the Effexor  was helping with her anxiety.  Feels like her depression is better overall.  She does over think and thinks bad things are going to happen.  She is seeing a therapist.  Mood status: controlled Satisfied with current treatment?: yes Symptom severity: moderate  Duration of current treatment : years Side effects: no Medication compliance: good compliance Psychotherapy/counseling: yes in the past Depressed mood: yes Anxious mood: yes Anhedonia: sometimes Significant weight loss or gain: no Insomnia: sleeping too much Fatigue: yes Feelings of worthlessness or guilt: sometimes Impaired concentration/indecisiveness: no Suicidal ideations: no Hopelessness: yes Crying spells: yes  Depression Screen done today and results listed below:     04/04/2024    9:30 AM 08/01/2023    4:20 PM 03/07/2023    3:27 PM 01/25/2023    9:05 AM 01/11/2023    3:59 PM  Depression screen PHQ 2/9  Decreased Interest 1 1 1 1 2   Down, Depressed, Hopeless 0 0 1 1 2   PHQ - 2 Score 1 1 2 2 4   Altered sleeping 1 0 1 1 3    Tired, decreased energy 1 0 1 1 1   Change in appetite 0 0 0 2 1  Feeling bad or failure about yourself  0 2 0 1 0  Trouble concentrating 0 0 0 0 0  Moving slowly or fidgety/restless 0 0 0 1 0  Suicidal thoughts 0 0 0 0 0  PHQ-9 Score 3 3  4  8  9    Difficult doing work/chores Somewhat difficult Somewhat difficult Not difficult at all       Data saved with a previous flowsheet row definition    The patient does not have a history of falls. I did complete a risk assessment for falls. A plan of care for falls was documented.   Past Medical History:  Past Medical History:  Diagnosis Date   Allergy 2006   Anxiety 2005   Depression 2020   HTN (hypertension)    Medical history non-contributory    Prediabetes     Surgical History:  Past Surgical History:  Procedure Laterality Date   CHOLECYSTECTOMY N/A 12/22/2014   Procedure: LAPAROSCOPIC CHOLECYSTECTOMY;  Surgeon: Oneil Budge, MD;  Location: [facility];  Service: General;  Laterality: N/A;    Medications:  Current Outpatient Medications on File Prior to Visit  Medication Sig   albuterol  (VENTOLIN  HFA) 108 (90 Base) MCG/ACT inhaler Inhale 1-2 puffs into the lungs every 4 (four) hours as needed.   ibuprofen  (ADVIL )  800 MG tablet Take 1 tablet (800 mg total) by mouth every 8 (eight) hours as needed.   LORazepam  (ATIVAN ) 0.5 MG tablet Take 1 tablet (0.5 mg total) by mouth 2 (two) times daily as needed for anxiety.   naproxen  (NAPROSYN ) 500 MG tablet Take 1 tablet (500 mg total) by mouth 2 (two) times daily with a meal.   Norethindrone  Acetate-Ethinyl Estradiol  (HAILEY 1.5/30) 1.5-30 MG-MCG tablet Take 1 tablet by mouth daily.   promethazine  (PHENERGAN ) 25 MG tablet TAKE 1 TABLET BY MOUTH EVERY 6 HOURS AS NEEDED FOR NAUSEA OR VOMITING (MIGRAINES)   [DISCONTINUED] montelukast (SINGULAIR) 10 MG tablet Take 10 mg by mouth at bedtime as needed (for allergy relief).    No current facility-administered medications on file prior to visit.     Allergies:  Allergies  Allergen Reactions   Codeine Hives and Nausea And Vomiting    Social History:  Social History   Socioeconomic History   Marital status: Single    Spouse name: Not on file   Number of children: Not on file   Years of education: Not on file   Highest education level: Not on file  Occupational History   Not on file  Tobacco Use   Smoking status: Never   Smokeless tobacco: Never  Vaping Use   Vaping status: Never Used  Substance and Sexual Activity   Alcohol use: Not Currently    Alcohol/week: 1.0 standard drink of alcohol    Types: 1 Cans of beer per week    Comment: occasionally   Drug use: No   Sexual activity: Yes    Birth control/protection: None, Condom  Other Topics Concern   Not on file  Social History Narrative   Not on file   Social Drivers of Health   Tobacco Use: Low Risk (04/04/2024)   Patient History    Smoking Tobacco Use: Never    Smokeless Tobacco Use: Never    Passive Exposure: Not on file  Financial Resource Strain: Not on file  Food Insecurity: Not on file  Transportation Needs: Not on file  Physical Activity: Not on file  Stress: Not on file  Social Connections: Not on file  Intimate Partner Violence: Not on file  Depression (PHQ2-9): Low Risk (04/04/2024)   Depression (PHQ2-9)    PHQ-2 Score: 3  Alcohol Screen: Not on file  Housing: Not on file  Utilities: Not on file  Health Literacy: Not on file   Social History   Tobacco Use  Smoking Status Never  Smokeless Tobacco Never   Social History   Substance and Sexual Activity  Alcohol Use Not Currently   Alcohol/week: 1.0 standard drink of alcohol   Types: 1 Cans of beer per week   Comment: occasionally    Family History:  Family History  Problem Relation Age of Onset   Asthma Mother    Depression Mother    Anxiety disorder Mother    Heart disease Mother    COPD Mother    Bipolar disorder Mother    Miscarriages / Stillbirths Mother     Hyperlipidemia Father    Diabetes Father    Diabetes Sister    Cancer Maternal Grandfather    COPD Maternal Grandmother    COPD Maternal Uncle    Vision loss Maternal Uncle    Diabetes Sister     Past medical history, surgical history, medications, allergies, family history and social history reviewed with patient today and changes made to appropriate areas of the chart.  Review of Systems  Psychiatric/Behavioral:  Positive for depression and hallucinations. Negative for suicidal ideas.    All other ROS negative except what is listed above and in the HPI.      Objective:    BP 135/88 (BP Location: Right Arm, Patient Position: Sitting, Cuff Size: Normal)   Pulse 70   Temp 98 F (36.7 C) (Oral)   Ht 5' 3.78 (1.62 m)   Wt 204 lb (92.5 kg)   LMP 03/18/2024 (Approximate)   SpO2 94%   BMI 35.26 kg/m   Wt Readings from Last 3 Encounters:  04/04/24 204 lb (92.5 kg)  08/01/23 185 lb 12.8 oz (84.3 kg)  03/07/23 179 lb 12.8 oz (81.6 kg)    Physical Exam Vitals and nursing note reviewed.  Constitutional:      General: She is awake. She is not in acute distress.    Appearance: Normal appearance. She is well-developed. She is not ill-appearing.  HENT:     Head: Normocephalic and atraumatic.     Right Ear: Hearing, tympanic membrane, ear canal and external ear normal. No drainage.     Left Ear: Hearing, tympanic membrane, ear canal and external ear normal. No drainage.     Nose: Nose normal.     Right Sinus: No maxillary sinus tenderness or frontal sinus tenderness.     Left Sinus: No maxillary sinus tenderness or frontal sinus tenderness.     Mouth/Throat:     Mouth: Mucous membranes are moist.     Pharynx: Oropharynx is clear. Uvula midline. No pharyngeal swelling, oropharyngeal exudate or posterior oropharyngeal erythema.  Eyes:     General: Lids are normal.        Right eye: No discharge.        Left eye: No discharge.     Extraocular Movements: Extraocular movements  intact.     Conjunctiva/sclera: Conjunctivae normal.     Pupils: Pupils are equal, round, and reactive to light.     Visual Fields: Right eye visual fields normal and left eye visual fields normal.  Neck:     Thyroid: No thyromegaly.     Vascular: No carotid bruit.     Trachea: Trachea normal.  Cardiovascular:     Rate and Rhythm: Normal rate and regular rhythm.     Heart sounds: Normal heart sounds. No murmur heard.    No gallop.  Pulmonary:     Effort: Pulmonary effort is normal. No accessory muscle usage or respiratory distress.     Breath sounds: Normal breath sounds.  Chest:  Breasts:    Right: Normal.     Left: Normal.  Abdominal:     General: Bowel sounds are normal.     Palpations: Abdomen is soft. There is no hepatomegaly or splenomegaly.     Tenderness: There is no abdominal tenderness.  Musculoskeletal:        General: Normal range of motion.     Cervical back: Normal range of motion and neck supple.     Right lower leg: No edema.     Left lower leg: No edema.  Lymphadenopathy:     Head:     Right side of head: No submental, submandibular, tonsillar, preauricular or posterior auricular adenopathy.     Left side of head: No submental, submandibular, tonsillar, preauricular or posterior auricular adenopathy.     Cervical: No cervical adenopathy.     Upper Body:     Right upper body: No supraclavicular, axillary or pectoral adenopathy.  Left upper body: No supraclavicular, axillary or pectoral adenopathy.  Skin:    General: Skin is warm and dry.     Capillary Refill: Capillary refill takes less than 2 seconds.     Findings: No rash.  Neurological:     Mental Status: She is alert and oriented to person, place, and time.     Gait: Gait is intact.  Psychiatric:        Attention and Perception: Attention normal.        Mood and Affect: Mood normal.        Speech: Speech normal.        Behavior: Behavior normal. Behavior is cooperative.        Thought Content:  Thought content normal.        Judgment: Judgment normal.     Results for orders placed or performed in visit on 03/07/23  Microscopic Examination   Collection Time: 03/07/23  3:42 PM   Urine  Result Value Ref Range   WBC, UA 0-5 0 - 5 /hpf   RBC, Urine 0-2 0 - 2 /hpf   Epithelial Cells (non renal) 0-10 0 - 10 /hpf   Mucus, UA Present (A) Not Estab.   Bacteria, UA None seen None seen/Few  Urinalysis, Routine w reflex microscopic   Collection Time: 03/07/23  3:42 PM  Result Value Ref Range   Specific Gravity, UA >1.030 (H) 1.005 - 1.030   pH, UA 5.5 5.0 - 7.5   Color, UA Yellow Yellow   Appearance Ur Cloudy (A) Clear   Leukocytes,UA Negative Negative   Protein,UA Negative Negative/Trace   Glucose, UA Negative Negative   Ketones, UA Negative Negative   RBC, UA 2+ (A) Negative   Bilirubin, UA Negative Negative   Urobilinogen, Ur 0.2 0.2 - 1.0 mg/dL   Nitrite, UA Negative Negative   Microscopic Examination See below:       Assessment & Plan:   Problem List Items Addressed This Visit       Cardiovascular and Mediastinum   Primary hypertension   Chronic.  Controlled without medication.  Labs ordered today.  Return to clinic in 6 months for reevaluation.  Call sooner if concerns arise.          Other   Anxiety   Chronic.  Controlled.  Continue with current medication regimen of PRN Lorazepam .  Does not need refills today.  Labs ordered today.  Return to clinic in 6 months for reevaluation.  Call sooner if concerns arise.        Prediabetes   Chronic.  Controlled.  Continue with current medication regimen of metformin .  Labs ordered today.  Return to clinic in 6 months for reevaluation.  Call sooner if concerns arise.        Relevant Orders   Hemoglobin A1c   Other Visit Diagnoses       Annual physical exam    -  Primary   Health maintenance reviewed during visit today.  Labs ordered.  Vaccines reviewed.  Will be seeing GYN for updated PAP.   Relevant Orders    Hemoglobin A1c   CBC with Differential/Platelet   Comprehensive metabolic panel with GFR   Lipid panel   TSH     Screening for ischemic heart disease       Relevant Orders   Lipid panel        Follow up plan: Return in about 6 months (around 10/02/2024) for HTN, HLD, DM2 FU.   LABORATORY TESTING:  -  Pap smear: up to date- she will be seeing GYN next month.   IMMUNIZATIONS:   - Tdap: Tetanus vaccination status reviewed: last tetanus booster within 10 years. - Influenza: Refused - Pneumovax: Not applicable - Prevnar: Not applicable - COVID: Not applicable - HPV: Not applicable - Shingrix vaccine: Not applicable  SCREENING: -Mammogram: Not applicable  - Colonoscopy: Not applicable  - Bone Density: Not applicable  -Hearing Test: Not applicable  -Spirometry: Not applicable   PATIENT COUNSELING:   Advised to take 1 mg of folate supplement per day if capable of pregnancy.   Sexuality: Discussed sexually transmitted diseases, partner selection, use of condoms, avoidance of unintended pregnancy  and contraceptive alternatives.   Advised to avoid cigarette smoking.  I discussed with the patient that most people either abstain from alcohol or drink within safe limits (<=14/week and <=4 drinks/occasion for males, <=7/weeks and <= 3 drinks/occasion for females) and that the risk for alcohol disorders and other health effects rises proportionally with the number of drinks per week and how often a drinker exceeds daily limits.  Discussed cessation/primary prevention of drug use and availability of treatment for abuse.   Diet: Encouraged to adjust caloric intake to maintain  or achieve ideal body weight, to reduce intake of dietary saturated fat and total fat, to limit sodium intake by avoiding high sodium foods and not adding table salt, and to maintain adequate dietary potassium and calcium preferably from fresh fruits, vegetables, and low-fat dairy products.    stressed the  importance of regular exercise  Injury prevention: Discussed safety belts, safety helmets, smoke detector, smoking near bedding or upholstery.   Dental health: Discussed importance of regular tooth brushing, flossing, and dental visits.    NEXT PREVENTATIVE PHYSICAL DUE IN 1 YEAR. Return in about 6 months (around 10/02/2024) for HTN, HLD, DM2 FU.          "

## 2024-04-04 NOTE — Assessment & Plan Note (Signed)
 Chronic.  Controlled.  Continue with current medication regimen of metformin .  Labs ordered today.  Return to clinic in 6 months for reevaluation.  Call sooner if concerns arise.

## 2024-04-04 NOTE — Assessment & Plan Note (Signed)
 Chronic.  Controlled without medication..  Labs ordered today.  Return to clinic in 6 months for reevaluation.  Call sooner if concerns arise.

## 2024-04-04 NOTE — Assessment & Plan Note (Signed)
 Chronic.  Controlled.  Continue with current medication regimen of PRN Lorazepam .  Does not need refills today.  Labs ordered today.  Return to clinic in 6 months for reevaluation.  Call sooner if concerns arise.

## 2024-04-05 ENCOUNTER — Ambulatory Visit: Payer: Self-pay | Admitting: Nurse Practitioner

## 2024-04-05 LAB — CBC WITH DIFFERENTIAL/PLATELET
Basophils Absolute: 0 10*3/uL (ref 0.0–0.2)
Basos: 0 %
EOS (ABSOLUTE): 0.1 10*3/uL (ref 0.0–0.4)
Eos: 2 %
Hematocrit: 42.3 % (ref 34.0–46.6)
Hemoglobin: 13.8 g/dL (ref 11.1–15.9)
Immature Grans (Abs): 0 10*3/uL (ref 0.0–0.1)
Immature Granulocytes: 0 %
Lymphocytes Absolute: 2.1 10*3/uL (ref 0.7–3.1)
Lymphs: 40 %
MCH: 30.1 pg (ref 26.6–33.0)
MCHC: 32.6 g/dL (ref 31.5–35.7)
MCV: 92 fL (ref 79–97)
Monocytes Absolute: 0.5 10*3/uL (ref 0.1–0.9)
Monocytes: 10 %
Neutrophils Absolute: 2.6 10*3/uL (ref 1.4–7.0)
Neutrophils: 48 %
Platelets: 227 10*3/uL (ref 150–450)
RBC: 4.59 x10E6/uL (ref 3.77–5.28)
RDW: 13.7 % (ref 11.7–15.4)
WBC: 5.4 10*3/uL (ref 3.4–10.8)

## 2024-04-05 LAB — HEMOGLOBIN A1C
Est. average glucose Bld gHb Est-mCnc: 137 mg/dL
Hgb A1c MFr Bld: 6.4 % — ABNORMAL HIGH (ref 4.8–5.6)

## 2024-04-05 LAB — LIPID PANEL
Chol/HDL Ratio: 3.3 ratio (ref 0.0–4.4)
Cholesterol, Total: 187 mg/dL (ref 100–199)
HDL: 57 mg/dL
LDL Chol Calc (NIH): 105 mg/dL — ABNORMAL HIGH (ref 0–99)
Triglycerides: 140 mg/dL (ref 0–149)
VLDL Cholesterol Cal: 25 mg/dL (ref 5–40)

## 2024-04-05 LAB — COMPREHENSIVE METABOLIC PANEL WITH GFR
ALT: 12 [IU]/L (ref 0–32)
AST: 14 [IU]/L (ref 0–40)
Albumin: 4.3 g/dL (ref 3.9–4.9)
Alkaline Phosphatase: 63 [IU]/L (ref 41–116)
BUN/Creatinine Ratio: 17 (ref 9–23)
BUN: 12 mg/dL (ref 6–20)
Bilirubin Total: 0.3 mg/dL (ref 0.0–1.2)
CO2: 23 mmol/L (ref 20–29)
Calcium: 9.6 mg/dL (ref 8.7–10.2)
Chloride: 102 mmol/L (ref 96–106)
Creatinine, Ser: 0.69 mg/dL (ref 0.57–1.00)
Globulin, Total: 2.7 g/dL (ref 1.5–4.5)
Glucose: 116 mg/dL — ABNORMAL HIGH (ref 70–99)
Potassium: 4.1 mmol/L (ref 3.5–5.2)
Sodium: 140 mmol/L (ref 134–144)
Total Protein: 7 g/dL (ref 6.0–8.5)
eGFR: 114 mL/min/{1.73_m2}

## 2024-04-05 LAB — TSH: TSH: 2.18 u[IU]/mL (ref 0.450–4.500)

## 2024-05-30 ENCOUNTER — Ambulatory Visit: Admitting: Family Medicine

## 2024-10-03 ENCOUNTER — Ambulatory Visit: Admitting: Nurse Practitioner
